# Patient Record
Sex: Female | Born: 1965
Health system: Southern US, Community
[De-identification: ages and names within clinical notes are randomized; demographics above are authoritative.]

## PROBLEM LIST (undated history)

## (undated) ENCOUNTER — Emergency Department (HOSPITAL_COMMUNITY): Admission: EM | Payer: No Typology Code available for payment source | Source: Home / Self Care

## (undated) DIAGNOSIS — T7840XA Allergy, unspecified, initial encounter: Secondary | ICD-10-CM

## (undated) DIAGNOSIS — I10 Essential (primary) hypertension: Secondary | ICD-10-CM

## (undated) DIAGNOSIS — R011 Cardiac murmur, unspecified: Secondary | ICD-10-CM

## (undated) DIAGNOSIS — E669 Obesity, unspecified: Secondary | ICD-10-CM

## (undated) DIAGNOSIS — M928 Other specified juvenile osteochondrosis: Secondary | ICD-10-CM

## (undated) DIAGNOSIS — D649 Anemia, unspecified: Secondary | ICD-10-CM

## (undated) DIAGNOSIS — E785 Hyperlipidemia, unspecified: Secondary | ICD-10-CM

## (undated) HISTORY — DX: Hyperlipidemia, unspecified: E78.5

## (undated) HISTORY — DX: Essential (primary) hypertension: I10

## (undated) HISTORY — DX: Allergy, unspecified, initial encounter: T78.40XA

## (undated) HISTORY — DX: Other specified juvenile osteochondrosis: M92.8

## (undated) HISTORY — DX: Obesity, unspecified: E66.9

## (undated) HISTORY — PX: TUBAL LIGATION: SHX77

---

## 1997-10-04 ENCOUNTER — Encounter: Admission: RE | Admit: 1997-10-04 | Discharge: 1997-10-04 | Payer: Self-pay | Admitting: Family Medicine

## 1997-12-01 ENCOUNTER — Encounter: Admission: RE | Admit: 1997-12-01 | Discharge: 1997-12-01 | Payer: Self-pay | Admitting: Family Medicine

## 1997-12-06 ENCOUNTER — Encounter: Admission: RE | Admit: 1997-12-06 | Discharge: 1997-12-06 | Payer: Self-pay | Admitting: Family Medicine

## 1998-05-03 ENCOUNTER — Encounter: Admission: RE | Admit: 1998-05-03 | Discharge: 1998-05-03 | Payer: Self-pay | Admitting: Family Medicine

## 1998-05-10 ENCOUNTER — Encounter: Admission: RE | Admit: 1998-05-10 | Discharge: 1998-05-10 | Payer: Self-pay | Admitting: Sports Medicine

## 1998-05-19 ENCOUNTER — Other Ambulatory Visit: Admission: RE | Admit: 1998-05-19 | Discharge: 1998-05-19 | Payer: Self-pay | Admitting: *Deleted

## 1998-06-22 ENCOUNTER — Emergency Department (HOSPITAL_COMMUNITY): Admission: EM | Admit: 1998-06-22 | Discharge: 1998-06-22 | Payer: Self-pay | Admitting: Emergency Medicine

## 1998-06-28 ENCOUNTER — Encounter: Admission: RE | Admit: 1998-06-28 | Discharge: 1998-06-28 | Payer: Self-pay | Admitting: Family Medicine

## 1998-10-03 ENCOUNTER — Emergency Department (HOSPITAL_COMMUNITY): Admission: EM | Admit: 1998-10-03 | Discharge: 1998-10-03 | Payer: Self-pay | Admitting: Emergency Medicine

## 1998-11-11 ENCOUNTER — Encounter: Admission: RE | Admit: 1998-11-11 | Discharge: 1998-11-11 | Payer: Self-pay | Admitting: Family Medicine

## 1999-05-17 ENCOUNTER — Encounter: Admission: RE | Admit: 1999-05-17 | Discharge: 1999-05-17 | Payer: Self-pay | Admitting: Family Medicine

## 1999-05-17 ENCOUNTER — Other Ambulatory Visit: Admission: RE | Admit: 1999-05-17 | Discharge: 1999-05-17 | Payer: Self-pay | Admitting: Family Medicine

## 2000-02-07 ENCOUNTER — Encounter: Admission: RE | Admit: 2000-02-07 | Discharge: 2000-02-07 | Payer: Self-pay | Admitting: Family Medicine

## 2000-06-24 ENCOUNTER — Encounter: Admission: RE | Admit: 2000-06-24 | Discharge: 2000-06-24 | Payer: Self-pay | Admitting: Family Medicine

## 2000-06-24 ENCOUNTER — Other Ambulatory Visit: Admission: RE | Admit: 2000-06-24 | Discharge: 2000-06-24 | Payer: Self-pay | Admitting: Family Medicine

## 2000-06-25 ENCOUNTER — Encounter: Admission: RE | Admit: 2000-06-25 | Discharge: 2000-06-25 | Payer: Self-pay | Admitting: Sports Medicine

## 2000-07-16 ENCOUNTER — Encounter: Admission: RE | Admit: 2000-07-16 | Discharge: 2000-07-16 | Payer: Self-pay | Admitting: Family Medicine

## 2000-09-10 ENCOUNTER — Encounter: Admission: RE | Admit: 2000-09-10 | Discharge: 2000-09-10 | Payer: Self-pay | Admitting: Family Medicine

## 2001-03-25 ENCOUNTER — Encounter: Admission: RE | Admit: 2001-03-25 | Discharge: 2001-03-25 | Payer: Self-pay | Admitting: Family Medicine

## 2001-04-03 ENCOUNTER — Encounter: Admission: RE | Admit: 2001-04-03 | Discharge: 2001-04-03 | Payer: Self-pay | Admitting: Family Medicine

## 2001-08-13 ENCOUNTER — Encounter: Admission: RE | Admit: 2001-08-13 | Discharge: 2001-08-13 | Payer: Self-pay | Admitting: Family Medicine

## 2001-11-07 ENCOUNTER — Encounter: Admission: RE | Admit: 2001-11-07 | Discharge: 2001-11-07 | Payer: Self-pay | Admitting: Family Medicine

## 2001-11-07 ENCOUNTER — Ambulatory Visit (HOSPITAL_COMMUNITY): Admission: RE | Admit: 2001-11-07 | Discharge: 2001-11-07 | Payer: Self-pay | Admitting: Family Medicine

## 2001-11-10 ENCOUNTER — Encounter: Admission: RE | Admit: 2001-11-10 | Discharge: 2001-11-10 | Payer: Self-pay | Admitting: Sports Medicine

## 2001-12-25 ENCOUNTER — Encounter: Admission: RE | Admit: 2001-12-25 | Discharge: 2001-12-25 | Payer: Self-pay | Admitting: Family Medicine

## 2001-12-25 ENCOUNTER — Other Ambulatory Visit: Admission: RE | Admit: 2001-12-25 | Discharge: 2001-12-25 | Payer: Self-pay | Admitting: Family Medicine

## 2001-12-25 ENCOUNTER — Encounter (INDEPENDENT_AMBULATORY_CARE_PROVIDER_SITE_OTHER): Payer: Self-pay

## 2002-01-20 ENCOUNTER — Encounter: Admission: RE | Admit: 2002-01-20 | Discharge: 2002-01-20 | Payer: Self-pay | Admitting: Family Medicine

## 2002-07-01 ENCOUNTER — Ambulatory Visit (HOSPITAL_COMMUNITY): Admission: RE | Admit: 2002-07-01 | Discharge: 2002-07-01 | Payer: Self-pay | Admitting: Family Medicine

## 2002-07-01 ENCOUNTER — Other Ambulatory Visit: Admission: RE | Admit: 2002-07-01 | Discharge: 2002-07-01 | Payer: Self-pay | Admitting: Family Medicine

## 2002-07-01 ENCOUNTER — Encounter: Admission: RE | Admit: 2002-07-01 | Discharge: 2002-07-01 | Payer: Self-pay | Admitting: Family Medicine

## 2002-07-01 ENCOUNTER — Encounter: Payer: Self-pay | Admitting: Family Medicine

## 2002-08-19 ENCOUNTER — Inpatient Hospital Stay (HOSPITAL_COMMUNITY): Admission: AD | Admit: 2002-08-19 | Discharge: 2002-08-19 | Payer: Self-pay | Admitting: Obstetrics and Gynecology

## 2003-02-12 ENCOUNTER — Encounter: Admission: RE | Admit: 2003-02-12 | Discharge: 2003-02-12 | Payer: Self-pay | Admitting: Family Medicine

## 2003-02-15 ENCOUNTER — Encounter: Admission: RE | Admit: 2003-02-15 | Discharge: 2003-02-15 | Payer: Self-pay | Admitting: Family Medicine

## 2003-08-02 ENCOUNTER — Encounter: Admission: RE | Admit: 2003-08-02 | Discharge: 2003-08-02 | Payer: Self-pay | Admitting: Family Medicine

## 2003-10-13 ENCOUNTER — Ambulatory Visit: Payer: Self-pay | Admitting: Family Medicine

## 2003-11-10 ENCOUNTER — Ambulatory Visit: Payer: Self-pay | Admitting: Family Medicine

## 2003-12-06 ENCOUNTER — Emergency Department (HOSPITAL_COMMUNITY): Admission: EM | Admit: 2003-12-06 | Discharge: 2003-12-06 | Payer: Self-pay | Admitting: Family Medicine

## 2003-12-22 ENCOUNTER — Emergency Department (HOSPITAL_COMMUNITY): Admission: EM | Admit: 2003-12-22 | Discharge: 2003-12-22 | Payer: Self-pay | Admitting: Family Medicine

## 2004-01-09 ENCOUNTER — Encounter (INDEPENDENT_AMBULATORY_CARE_PROVIDER_SITE_OTHER): Payer: Self-pay | Admitting: *Deleted

## 2004-01-09 LAB — CONVERTED CEMR LAB

## 2004-01-12 ENCOUNTER — Other Ambulatory Visit: Admission: RE | Admit: 2004-01-12 | Discharge: 2004-01-12 | Payer: Self-pay | Admitting: Family Medicine

## 2004-01-12 ENCOUNTER — Ambulatory Visit: Payer: Self-pay | Admitting: Family Medicine

## 2004-01-28 ENCOUNTER — Ambulatory Visit: Payer: Self-pay | Admitting: Sports Medicine

## 2004-04-12 ENCOUNTER — Ambulatory Visit: Payer: Self-pay | Admitting: Family Medicine

## 2004-06-12 ENCOUNTER — Ambulatory Visit: Payer: Self-pay | Admitting: Family Medicine

## 2004-07-14 ENCOUNTER — Ambulatory Visit: Payer: Self-pay | Admitting: Family Medicine

## 2004-07-18 ENCOUNTER — Ambulatory Visit: Payer: Self-pay | Admitting: Family Medicine

## 2004-07-28 ENCOUNTER — Ambulatory Visit: Payer: Self-pay | Admitting: Family Medicine

## 2004-08-07 ENCOUNTER — Emergency Department (HOSPITAL_COMMUNITY): Admission: EM | Admit: 2004-08-07 | Discharge: 2004-08-07 | Payer: Self-pay | Admitting: Family Medicine

## 2004-09-01 ENCOUNTER — Ambulatory Visit: Payer: Self-pay | Admitting: Family Medicine

## 2004-09-04 ENCOUNTER — Ambulatory Visit: Payer: Self-pay | Admitting: Family Medicine

## 2004-09-12 ENCOUNTER — Ambulatory Visit: Payer: Self-pay | Admitting: Sports Medicine

## 2004-09-19 ENCOUNTER — Ambulatory Visit: Payer: Self-pay | Admitting: Family Medicine

## 2004-09-21 ENCOUNTER — Encounter: Admission: RE | Admit: 2004-09-21 | Discharge: 2004-09-21 | Payer: Self-pay | Admitting: Sports Medicine

## 2004-10-27 ENCOUNTER — Ambulatory Visit: Payer: Self-pay | Admitting: Sports Medicine

## 2005-01-14 ENCOUNTER — Emergency Department (HOSPITAL_COMMUNITY): Admission: AD | Admit: 2005-01-14 | Discharge: 2005-01-14 | Payer: Self-pay | Admitting: Emergency Medicine

## 2005-03-26 ENCOUNTER — Emergency Department (HOSPITAL_COMMUNITY): Admission: EM | Admit: 2005-03-26 | Discharge: 2005-03-26 | Payer: Self-pay | Admitting: Family Medicine

## 2005-03-26 ENCOUNTER — Ambulatory Visit (HOSPITAL_COMMUNITY): Admission: RE | Admit: 2005-03-26 | Discharge: 2005-03-26 | Payer: Self-pay | Admitting: Family Medicine

## 2005-03-27 ENCOUNTER — Ambulatory Visit: Payer: Self-pay | Admitting: Family Medicine

## 2005-04-19 ENCOUNTER — Emergency Department (HOSPITAL_COMMUNITY): Admission: EM | Admit: 2005-04-19 | Discharge: 2005-04-19 | Payer: Self-pay | Admitting: Family Medicine

## 2005-05-09 ENCOUNTER — Encounter: Payer: Self-pay | Admitting: Family Medicine

## 2005-09-26 ENCOUNTER — Ambulatory Visit: Payer: Self-pay | Admitting: Family Medicine

## 2005-09-28 ENCOUNTER — Ambulatory Visit: Payer: Self-pay | Admitting: Family Medicine

## 2005-10-12 ENCOUNTER — Ambulatory Visit: Payer: Self-pay | Admitting: Family Medicine

## 2005-10-12 ENCOUNTER — Other Ambulatory Visit: Admission: RE | Admit: 2005-10-12 | Discharge: 2005-10-12 | Payer: Self-pay | Admitting: Family Medicine

## 2005-12-19 ENCOUNTER — Ambulatory Visit: Payer: Self-pay | Admitting: Sports Medicine

## 2006-01-25 ENCOUNTER — Encounter: Payer: Self-pay | Admitting: Family Medicine

## 2006-01-25 ENCOUNTER — Ambulatory Visit: Payer: Self-pay | Admitting: Family Medicine

## 2006-01-31 ENCOUNTER — Emergency Department (HOSPITAL_COMMUNITY): Admission: EM | Admit: 2006-01-31 | Discharge: 2006-01-31 | Payer: Self-pay | Admitting: Family Medicine

## 2006-03-07 DIAGNOSIS — E7800 Pure hypercholesterolemia, unspecified: Secondary | ICD-10-CM | POA: Insufficient documentation

## 2006-03-07 DIAGNOSIS — E669 Obesity, unspecified: Secondary | ICD-10-CM | POA: Insufficient documentation

## 2006-03-07 DIAGNOSIS — M25569 Pain in unspecified knee: Secondary | ICD-10-CM | POA: Insufficient documentation

## 2006-03-07 DIAGNOSIS — G43909 Migraine, unspecified, not intractable, without status migrainosus: Secondary | ICD-10-CM | POA: Insufficient documentation

## 2006-03-08 ENCOUNTER — Encounter (INDEPENDENT_AMBULATORY_CARE_PROVIDER_SITE_OTHER): Payer: Self-pay | Admitting: *Deleted

## 2006-03-21 ENCOUNTER — Telehealth (INDEPENDENT_AMBULATORY_CARE_PROVIDER_SITE_OTHER): Payer: Self-pay | Admitting: *Deleted

## 2006-03-25 ENCOUNTER — Encounter: Admission: RE | Admit: 2006-03-25 | Discharge: 2006-03-25 | Payer: Self-pay | Admitting: Sports Medicine

## 2006-03-25 ENCOUNTER — Encounter: Payer: Self-pay | Admitting: Family Medicine

## 2006-04-09 ENCOUNTER — Telehealth: Payer: Self-pay | Admitting: *Deleted

## 2006-04-10 ENCOUNTER — Ambulatory Visit: Payer: Self-pay | Admitting: Family Medicine

## 2006-05-15 ENCOUNTER — Telehealth: Payer: Self-pay | Admitting: *Deleted

## 2006-05-20 ENCOUNTER — Emergency Department (HOSPITAL_COMMUNITY): Admission: EM | Admit: 2006-05-20 | Discharge: 2006-05-20 | Payer: Self-pay | Admitting: Family Medicine

## 2006-05-21 ENCOUNTER — Telehealth: Payer: Self-pay | Admitting: *Deleted

## 2006-05-22 ENCOUNTER — Telehealth: Payer: Self-pay | Admitting: *Deleted

## 2006-06-10 ENCOUNTER — Encounter: Payer: Self-pay | Admitting: Family Medicine

## 2006-06-10 ENCOUNTER — Ambulatory Visit: Payer: Self-pay | Admitting: Family Medicine

## 2006-06-10 LAB — CONVERTED CEMR LAB

## 2006-06-11 ENCOUNTER — Telehealth: Payer: Self-pay | Admitting: Family Medicine

## 2006-09-13 ENCOUNTER — Ambulatory Visit: Payer: Self-pay | Admitting: Family Medicine

## 2006-09-13 ENCOUNTER — Telehealth (INDEPENDENT_AMBULATORY_CARE_PROVIDER_SITE_OTHER): Payer: Self-pay | Admitting: *Deleted

## 2006-10-06 ENCOUNTER — Telehealth (INDEPENDENT_AMBULATORY_CARE_PROVIDER_SITE_OTHER): Payer: Self-pay | Admitting: *Deleted

## 2006-10-08 ENCOUNTER — Telehealth: Payer: Self-pay | Admitting: *Deleted

## 2006-12-16 ENCOUNTER — Telehealth: Payer: Self-pay | Admitting: *Deleted

## 2007-01-03 ENCOUNTER — Ambulatory Visit: Payer: Self-pay | Admitting: Family Medicine

## 2007-01-06 ENCOUNTER — Encounter: Payer: Self-pay | Admitting: Family Medicine

## 2007-01-06 ENCOUNTER — Ambulatory Visit: Payer: Self-pay | Admitting: Family Medicine

## 2007-01-06 LAB — CONVERTED CEMR LAB
Cholesterol: 210 mg/dL — ABNORMAL HIGH (ref 0–200)
Total CHOL/HDL Ratio: 4.9

## 2007-01-07 ENCOUNTER — Encounter: Payer: Self-pay | Admitting: Family Medicine

## 2007-01-15 ENCOUNTER — Telehealth: Payer: Self-pay | Admitting: *Deleted

## 2007-02-18 ENCOUNTER — Telehealth (INDEPENDENT_AMBULATORY_CARE_PROVIDER_SITE_OTHER): Payer: Self-pay | Admitting: *Deleted

## 2007-03-03 ENCOUNTER — Telehealth: Payer: Self-pay | Admitting: *Deleted

## 2007-03-04 ENCOUNTER — Encounter (INDEPENDENT_AMBULATORY_CARE_PROVIDER_SITE_OTHER): Payer: Self-pay | Admitting: Family Medicine

## 2007-03-04 ENCOUNTER — Other Ambulatory Visit: Admission: RE | Admit: 2007-03-04 | Discharge: 2007-03-04 | Payer: Self-pay | Admitting: Family Medicine

## 2007-03-04 ENCOUNTER — Ambulatory Visit: Payer: Self-pay | Admitting: Family Medicine

## 2007-03-04 LAB — CONVERTED CEMR LAB
FSH: 4.9 milliintl units/mL
HCT: 37.3 % (ref 36.0–46.0)
Hemoglobin: 11.2 g/dL — ABNORMAL LOW (ref 12.0–15.0)
MCHC: 30 g/dL (ref 30.0–36.0)
MCV: 80.2 fL (ref 78.0–100.0)
RBC: 4.65 M/uL (ref 3.87–5.11)
RDW: 14.4 % (ref 11.5–15.5)
TSH: 0.891 microintl units/mL (ref 0.350–5.50)
WBC: 7.9 10*3/uL (ref 4.0–10.5)

## 2007-03-05 ENCOUNTER — Ambulatory Visit (HOSPITAL_COMMUNITY): Admission: RE | Admit: 2007-03-05 | Discharge: 2007-03-05 | Payer: Self-pay | Admitting: Family Medicine

## 2007-03-07 ENCOUNTER — Telehealth: Payer: Self-pay | Admitting: *Deleted

## 2007-03-09 ENCOUNTER — Encounter (INDEPENDENT_AMBULATORY_CARE_PROVIDER_SITE_OTHER): Payer: Self-pay | Admitting: Family Medicine

## 2007-03-12 ENCOUNTER — Encounter (INDEPENDENT_AMBULATORY_CARE_PROVIDER_SITE_OTHER): Payer: Self-pay | Admitting: Family Medicine

## 2007-03-26 ENCOUNTER — Encounter: Admission: RE | Admit: 2007-03-26 | Discharge: 2007-03-26 | Payer: Self-pay | Admitting: Family Medicine

## 2007-05-14 ENCOUNTER — Ambulatory Visit: Payer: Self-pay | Admitting: Family Medicine

## 2007-05-15 ENCOUNTER — Telehealth: Payer: Self-pay | Admitting: Family Medicine

## 2007-06-10 ENCOUNTER — Encounter: Payer: Self-pay | Admitting: Family Medicine

## 2007-08-04 ENCOUNTER — Telehealth: Payer: Self-pay | Admitting: *Deleted

## 2007-08-06 ENCOUNTER — Emergency Department (HOSPITAL_COMMUNITY): Admission: EM | Admit: 2007-08-06 | Discharge: 2007-08-06 | Payer: Self-pay | Admitting: Emergency Medicine

## 2007-08-26 ENCOUNTER — Ambulatory Visit: Payer: Self-pay | Admitting: Family Medicine

## 2007-09-09 ENCOUNTER — Other Ambulatory Visit: Admission: RE | Admit: 2007-09-09 | Discharge: 2007-09-09 | Payer: Self-pay | Admitting: Family Medicine

## 2007-09-09 ENCOUNTER — Encounter: Payer: Self-pay | Admitting: Family Medicine

## 2007-09-09 ENCOUNTER — Telehealth: Payer: Self-pay | Admitting: *Deleted

## 2007-09-09 ENCOUNTER — Ambulatory Visit: Payer: Self-pay | Admitting: Family Medicine

## 2007-09-12 ENCOUNTER — Encounter: Payer: Self-pay | Admitting: Family Medicine

## 2007-09-12 ENCOUNTER — Telehealth (INDEPENDENT_AMBULATORY_CARE_PROVIDER_SITE_OTHER): Payer: Self-pay | Admitting: *Deleted

## 2007-12-10 ENCOUNTER — Telehealth (INDEPENDENT_AMBULATORY_CARE_PROVIDER_SITE_OTHER): Payer: Self-pay | Admitting: Family Medicine

## 2007-12-15 ENCOUNTER — Ambulatory Visit: Payer: Self-pay | Admitting: Family Medicine

## 2007-12-15 ENCOUNTER — Encounter (INDEPENDENT_AMBULATORY_CARE_PROVIDER_SITE_OTHER): Payer: Self-pay | Admitting: Family Medicine

## 2008-01-06 ENCOUNTER — Ambulatory Visit: Payer: Self-pay | Admitting: Family Medicine

## 2008-01-08 ENCOUNTER — Ambulatory Visit: Payer: Self-pay | Admitting: Family Medicine

## 2008-08-10 ENCOUNTER — Ambulatory Visit: Payer: Self-pay | Admitting: Family Medicine

## 2008-08-10 ENCOUNTER — Encounter: Payer: Self-pay | Admitting: Family Medicine

## 2008-08-16 ENCOUNTER — Emergency Department (HOSPITAL_COMMUNITY): Admission: EM | Admit: 2008-08-16 | Discharge: 2008-08-16 | Payer: Self-pay | Admitting: Family Medicine

## 2008-08-17 ENCOUNTER — Telehealth: Payer: Self-pay | Admitting: *Deleted

## 2008-08-18 ENCOUNTER — Encounter: Payer: Self-pay | Admitting: Family Medicine

## 2008-08-19 ENCOUNTER — Encounter: Payer: Self-pay | Admitting: Family Medicine

## 2009-02-17 ENCOUNTER — Emergency Department (HOSPITAL_COMMUNITY): Admission: EM | Admit: 2009-02-17 | Discharge: 2009-02-17 | Payer: Self-pay | Admitting: Emergency Medicine

## 2009-03-09 ENCOUNTER — Telehealth: Payer: Self-pay | Admitting: Family Medicine

## 2009-03-21 ENCOUNTER — Ambulatory Visit: Payer: Self-pay | Admitting: Family Medicine

## 2009-03-21 ENCOUNTER — Encounter: Payer: Self-pay | Admitting: Family Medicine

## 2009-03-22 ENCOUNTER — Encounter: Payer: Self-pay | Admitting: Family Medicine

## 2009-03-22 ENCOUNTER — Encounter: Admission: RE | Admit: 2009-03-22 | Discharge: 2009-03-22 | Payer: Self-pay | Admitting: Family Medicine

## 2009-03-22 LAB — CONVERTED CEMR LAB
ALT: 19 units/L (ref 0–35)
Alkaline Phosphatase: 68 units/L (ref 39–117)
CO2: 21 meq/L (ref 19–32)
Calcium: 9 mg/dL (ref 8.4–10.5)
Creatinine, Ser: 0.85 mg/dL (ref 0.40–1.20)
HDL: 50 mg/dL (ref >39)
MCHC: 29.9 g/dL — ABNORMAL LOW (ref 30.0–36.0)
MCV: 79.4 fL (ref 78.0–100.0)
RBC: 4.8 M/uL (ref 3.87–5.11)
RDW: 14.5 % (ref 11.5–15.5)
Sodium: 137 meq/L (ref 135–145)
Total Bilirubin: 0.4 mg/dL (ref 0.3–1.2)
VLDL: 22 mg/dL (ref 0–40)
WBC: 8.7 10*3/uL (ref 4.0–10.5)

## 2009-03-23 ENCOUNTER — Ambulatory Visit: Payer: Self-pay | Admitting: Family Medicine

## 2009-04-13 ENCOUNTER — Ambulatory Visit: Payer: Self-pay | Admitting: Family Medicine

## 2009-04-13 DIAGNOSIS — H902 Conductive hearing loss, unspecified: Secondary | ICD-10-CM | POA: Insufficient documentation

## 2009-05-04 ENCOUNTER — Encounter: Payer: Self-pay | Admitting: Family Medicine

## 2009-05-04 ENCOUNTER — Ambulatory Visit: Payer: Self-pay | Admitting: Family Medicine

## 2009-05-04 LAB — CONVERTED CEMR LAB: Whiff Test: NEGATIVE

## 2009-05-05 ENCOUNTER — Encounter: Payer: Self-pay | Admitting: Family Medicine

## 2009-05-17 ENCOUNTER — Ambulatory Visit: Payer: Self-pay | Admitting: Family Medicine

## 2009-05-17 DIAGNOSIS — L680 Hirsutism: Secondary | ICD-10-CM | POA: Insufficient documentation

## 2009-05-17 LAB — CONVERTED CEMR LAB
Basophils Absolute: 0 10*3/uL (ref 0.0–0.1)
Basophils Relative: 0 % (ref 0–1)
Beta hcg, urine, semiquantitative: NEGATIVE
FSH: 2.5 milliintl units/mL
MCHC: 29.6 g/dL — ABNORMAL LOW (ref 30.0–36.0)
Monocytes Absolute: 0.5 10*3/uL (ref 0.1–1.0)
Monocytes Relative: 6 % (ref 3–12)
Neutro Abs: 5.8 10*3/uL (ref 1.7–7.7)
TSH: 0.838 microintl units/mL (ref 0.350–4.500)
Testosterone: 44.95 ng/dL (ref 10–70)

## 2009-06-12 ENCOUNTER — Emergency Department (HOSPITAL_COMMUNITY): Admission: EM | Admit: 2009-06-12 | Discharge: 2009-06-12 | Payer: Self-pay | Admitting: Emergency Medicine

## 2009-06-16 ENCOUNTER — Telehealth: Payer: Self-pay | Admitting: Family Medicine

## 2009-06-17 ENCOUNTER — Ambulatory Visit: Payer: Self-pay | Admitting: Family Medicine

## 2009-06-17 DIAGNOSIS — L509 Urticaria, unspecified: Secondary | ICD-10-CM | POA: Insufficient documentation

## 2009-08-16 ENCOUNTER — Ambulatory Visit: Payer: Self-pay | Admitting: Family Medicine

## 2009-08-16 ENCOUNTER — Encounter: Payer: Self-pay | Admitting: Family Medicine

## 2009-08-22 ENCOUNTER — Encounter: Payer: Self-pay | Admitting: Family Medicine

## 2009-09-07 ENCOUNTER — Ambulatory Visit: Payer: Self-pay | Admitting: Family Medicine

## 2009-09-07 ENCOUNTER — Telehealth: Payer: Self-pay | Admitting: Family Medicine

## 2009-09-07 LAB — CONVERTED CEMR LAB: Whiff Test: NEGATIVE

## 2009-09-26 ENCOUNTER — Telehealth: Payer: Self-pay | Admitting: Family Medicine

## 2009-09-26 ENCOUNTER — Ambulatory Visit: Payer: Self-pay | Admitting: Family Medicine

## 2009-09-26 DIAGNOSIS — M928 Other specified juvenile osteochondrosis: Secondary | ICD-10-CM | POA: Insufficient documentation

## 2010-01-04 ENCOUNTER — Encounter: Payer: Self-pay | Admitting: Family Medicine

## 2010-01-30 ENCOUNTER — Emergency Department (HOSPITAL_COMMUNITY)
Admission: EM | Admit: 2010-01-30 | Discharge: 2010-01-30 | Payer: Self-pay | Source: Home / Self Care | Admitting: Emergency Medicine

## 2010-02-07 NOTE — Assessment & Plan Note (Signed)
Summary: READ PPD/B MC  Nurse Visit   Allergies: No Known Drug Allergies  PPD Results    Date of reading: 03/23/2009    Results: < 5mm    Interpretation: negative  Orders Added: 1)  No Charge Patient Arrived (NCPA0) [NCPA0]

## 2010-02-07 NOTE — Progress Notes (Signed)
Summary: triage  Phone Note Call from Patient Call back at Home Phone [phone]   Caller: Patient Summary of Call: Pt was seen at urgent care for hives, but now eye and face is swollen. Initial call taken by: Clydell Hakim,  June 16, 2009 11:20 AM  Follow-up for Phone Call        went to [facility] sunday. using hydrocortisone creme with prednisone which is not working. thinks being in the sun makes it worse. no appt left. sent to UC. she agreed with plan Follow-up by: Golden Circle RN,  June 16, 2009 11:27 AM  Additional Follow-up for Phone Call Additional follow up Details #1::        states the parking lot at [facility] was full. she did not even go in. she has been taking benadryl 50 mg. states the creme they gave her for her face is making it worse or reacting with the sun. she is not going to put any more on appt tomorrow with Dr. Burnadette Pop at 1:30 Additional Follow-up by: Golden Circle RN,  June 16, 2009 3:27 PM

## 2010-02-07 NOTE — Assessment & Plan Note (Signed)
Summary: ? bacterial infection,tcb   Vital Signs:  Patient profile:   45 year old female Height:      68.5 inches Weight:      227 pounds BMI:     34.14 BSA:     2.17 Temp:     98.6 degrees F Pulse rate:   80 / minute BP sitting:   130 / 88  Vitals Entered By: Jone Baseman CMA (May 04, 2009 11:21 AM) CC: ? vaginal irritation Is Patient Diabetic? No Pain Assessment Patient in pain? yes     Location: vaginal area Intensity: 5   Primary Care Provider:  Milinda Antis MD  CC:  ? vaginal irritation.  History of Present Illness: [name] comes in today for vaginal irritation.  On Sunday she had intercourse with a new partner.  She had not had intercourse for a little while and was very dry.  She didn't have any lubrication and still proceeded with intercourse, eventhough it was somewhat painful.  Monday she noticed burning and irritation so she put vaseline inher vagina nad now she is having itching.  Denies discharge.  Did not use protection. NO abdominal pain or fevers.  Habits & Providers  Alcohol-Tobacco-Diet     Tobacco Status: never  Allergies: No Known Drug Allergies  Physical Exam  General:  overweight, alert, NAD, vitals reviewed Abdomen:  soft, nt, nd Genitalia:  normal introitus.  vaginal walls will scattered areas of erythema/irritation.  [place] amount of white discharge.  No cervical lesions.    Impression & Recommendations:  Problem # 1:  VAGINITIS (ICD-616.10) Assessment New  Wet prep negative for BV, yeast, and trich.  Likely irritation from the friction of intercourse.  Advised using KY or similar lubrication in the future.  Also advised condom use everytime.    Orders: [facility]- Est Level  3 ([address])  Complete Medication List: 1)  Claritin 10 Mg Tabs (Loratadine) .... Take 1 tablet by mouth once a day 2)  Maxalt 10 Mg Tabs (Rizatriptan benzoate) .... Take 1 tablet by mouth 3x a week for migraines 3)  Zofran 4 Mg Tabs (Ondansetron hcl) .Marland Kitchen.. 1  by mouth every 6 hours as needed for nausea and vomiting 4)  Ibuprofen 800 Mg Tabs (Ibuprofen) .Marland Kitchen.. 1 by mouth three times as needed pain 5)  Terbinafine Hcl 250 Mg Tabs (Terbinafine hcl) .Marland Kitchen.. 1 by mouth daily x 12 weeks dispense: qs 6)  Fish Oil 1000 Mg Caps (Omega-3 fatty acids) .Marland Kitchen.. 1 cap two times a day 7)  Flagyl 500 Mg Tabs (Metronidazole) .Marland Kitchen.. 1 by mouth two times a day x 7 days do not take with alcohol 8)  Fluticasone Propionate 50 Mcg/act Susp (Fluticasone propionate) .Marland Kitchen.. 1 spray each nostril daily  Other [facility]: [facility] [phone]) GC/Chlamydia-[facility] ([id number])  Laboratory Results  Date/Time Received: May 04, 2009 11:36 AM  Date/Time Reported: May 04, 2009 11:50 AM   [employer] Source: vaginal WBC/hpf: >20 Bacteria/hpf: 3+  Cocci Clue cells/hpf: none  Negative whiff Yeast/hpf: none Trichomonas/hpf: none Comments: ...........test performed by...........Marland KitchenTerese Door, CMA

## 2010-02-07 NOTE — Progress Notes (Signed)
Summary: triage  Phone Note Call from Patient Call back at Home Phone [phone]   Caller: Patient Summary of Call: Pt wondering if she can get rx that was once called in for her for toenail fungus.   Initial call taken by: Clydell Hakim,  March 09, 2009 1:38 PM  Follow-up for Phone Call        called her to offer appt. she has on on the 14th & wants to wait until then Follow-up by: Golden Circle RN,  March 09, 2009 2:21 PM

## 2010-02-07 NOTE — Assessment & Plan Note (Signed)
Summary: swelling above ankle/[place]ott   Vital Signs:  Patient profile:   45 year old female Height:      68.5 inches Weight:      231 pounds BMI:     34.74 Temp:     98.6 degrees F Pulse rate:   69 / minute BP sitting:   109 / 70  (left arm)  Vitals Entered By: Theresia Lo RN (September 26, 2009 10:41 AM) CC:  left leg has area of swelling Is Patient Diabetic? No Pain Assessment Patient in pain? no        Primary Care [place]:  Milinda Antis MD  CC:   left leg has area of swelling.  History of Present Illness: Rachel Cortez has noticed a small bump on her right lateral leg above her ankle for the past week.  She associaties it with recent increase in walking for exercise.  No recent immobilization, surgery, etc.  No pain, itching, redness, swelling.  No trauma to the area.  Also complans of left anterior knee pain in area where she has Osgood-Shlatter.  Pain is only when she kneels on it for long periods of time.  otherwise has been asymptomatic since childhood.  She is concerned mostly of cosmetic appearance.  No significant change.  Habits & Providers  Alcohol-Tobacco-Diet     Tobacco Status: never  Current Medications (verified): 1)  Claritin 10 Mg Tabs (Loratadine) .... Take 1 Tablet By Mouth Once A Day 2)  Maxalt 10 Mg Tabs (Rizatriptan Benzoate) .... Take 1 Tablet By Mouth 3x A Week For Migraines 3)  Fish Oil 1000 Mg Caps (Omega-3 Fatty Acids) .Marland Kitchen.. 1 Cap Two Times A Day 4)  Fluticasone Propionate 50 Mcg/act Susp (Fluticasone Propionate) .Marland Kitchen.. 1 Spray Each Nostril Daily  Allergies: No Known Drug Allergies PMH-FH-SH reviewed for relevance  Review of Systems      See HPI  Physical Exam  General:  VS Reviewed. Well appearing.   Extremities:   bony prominence with overlying soft tissue over the left tibial tubercle.  Varicosities present.  No tenderness, erythema.  0.75 cm soft tissue prominence, poorly circumscribed located over right lateral leg above lateral  malleolus. no erythema, pain.   Impression & Recommendations:  Problem # 1:  OTHER DISORDERS OF SOFT TISSUE (ICD-729.99)  possible early varicose vein, or less likely lipoma.  Not concerning for DVT.  Advised no further treatment.  Will return for evluation if she feels it is chnging.  Orders: [facility]- Est Level  3 ([address])  Problem # 2:  Hx of OSGOOD SCHLATTER'S DISEASE (ICD-732.4)  unchanged since childhood.  Advised protecting bony prominence while kneeling.    Orders: [facility]- Est Level  3 ([address])  Complete Medication List: 1)  Claritin 10 Mg Tabs (Loratadine) .... Take 1 tablet by mouth once a day 2)  Maxalt 10 Mg Tabs (Rizatriptan benzoate) .... Take 1 tablet by mouth 3x a week for migraines 3)  Fish Oil 1000 Mg Caps (Omega-3 fatty acids) .Marland Kitchen.. 1 cap two times a day 4)  Fluticasone Propionate 50 Mcg/act Susp (Fluticasone propionate) .Marland Kitchen.. 1 spray each nostril daily  Patient Instructions: 1)  the bump on your ankle may be due to a lipoma (fatty tissue) or early varicose vein.  They are not dangerous and there is nothing you need to do with it. 2)  The bump on your ankle is leftover from yoru [employer].  Kneel on a padded surface, but otherwise, nothing you need to do. 3)  Follow-up with your  PCP.

## 2010-02-07 NOTE — Progress Notes (Signed)
Summary: triage  Phone Note Call from Patient Call back at Home Phone [phone]   Caller: Patient Summary of Call: Has knot on side of leg.  Has been walking twice a day and wonders if this could cause it. Initial call taken by: Clydell Hakim,  September 26, 2009 9:51 AM  Follow-up for Phone Call        above ankle on outer aspect. started last night. not painful. feels like "fatty tissue" stands a lot on her job & has been walking more lately. wants it checked. 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 aware she will not be seeing her pcp Follow-up by: Golden Circle RN,  September 26, 2009 9:55 AM

## 2010-02-07 NOTE — Assessment & Plan Note (Signed)
Summary: cough x 1 wk/Rachel Cortez/[place]  [facility]  Vital Signs:  Patient profile:   45 year old female Height:      68.5 inches Weight:      227.4 pounds BMI:     34.20 Temp:     98.3 degrees F Pulse rate:   60 / minute BP sitting:   114 / 78  Vitals Entered By: Golden Circle RN (August 16, 2009 11:25 AM) CC: cough x 1 week   Primary Care Provider:  Milinda Antis MD  CC:  cough x 1 week.  History of Present Illness: 44 yo here for work in appt for cough x 1 week  last week had a sore throat, rhinorrhea.  ow resolved but continues to have cough- mostly when she talks or laughs.  Is not usuing any remedies other than prescribed meds.  Denies fever, chills, SOB, sore throat, CP, sputum, myalgias.  Cough not worse with laying down or after meals, or at night.  Current Medications (verified): 1)  Claritin 10 Mg Tabs (Loratadine) .... Take 1 Tablet By Mouth Once A Day 2)  Maxalt 10 Mg Tabs (Rizatriptan Benzoate) .... Take 1 Tablet By Mouth 3x A Week For Migraines 3)  Terbinafine Hcl 250 Mg Tabs (Terbinafine Hcl) .Marland Kitchen.. 1 By Mouth Daily X 12 Weeks Dispense: Qs 4)  Fish Oil 1000 Mg Caps (Omega-3 Fatty Acids) .Marland Kitchen.. 1 Cap Two Times A Day 5)  Fluticasone Propionate 50 Mcg/act Susp (Fluticasone Propionate) .Marland Kitchen.. 1 Spray Each Nostril Daily 6)  Prednisone (Pak) 10 Mg Tabs (Prednisone) .... 6 Tabs Day 1, 5 Day 2, 4 Day 3, 3 Day 4, 2 Day 5, and 1 Day 6 7)  Tessalon Perles 100 Mg Caps (Benzonatate) .... Take One Tablet Three Times A Day As Needed  Cough  Allergies: No Known Drug Allergies PMH-FH-SH reviewed-no changes except otherwise noted  Review of Systems      See HPI  Physical Exam  General:  VS Reviewed. Well appearing.  Hoarse voice.  Eyes:  No corneal or conjunctival inflammation noted.  Ears:  External ear exam shows no significant lesions or deformities.  Otoscopic examination reveals clear canals, tympanic membranes are intact bilaterally without bulging, retraction,  inflammation or discharge. Hearing is grossly normal bilaterally. Mouth:  Oral mucosa and oropharynx without lesions or exudates.  Lungs:  Normal respiratory effort, chest expands symmetrically. Lungs are clear to auscultation, no crackles or wheezes. Heart:  Normal rate and regular rhythm. S1 and S2 normal without gallop, murmur, click, rub or other extra sounds.   Impression & Recommendations:  Problem # 1:  COUGH (ICD-786.2)  seems to be due to post-nasal drainaige in the wake of recent viral illness.  WIll treat symptomatically with continued claritin + decongestant adn tessalon perles.  Discussed usual course of cough after illness may be prolonged even after cold is resolved.  No evidence of other factors such as GERD, asthma, tobacco use.  Orders: [facility]- Est Level  3 ([address])  Complete Medication List: 1)  Claritin 10 Mg Tabs (Loratadine) .... Take 1 tablet by mouth once a day 2)  Maxalt 10 Mg Tabs (Rizatriptan benzoate) .... Take 1 tablet by mouth 3x a week for migraines 3)  Terbinafine Hcl 250 Mg Tabs (Terbinafine hcl) .Marland Kitchen.. 1 by mouth daily x 12 weeks dispense: qs 4)  Fish Oil 1000 Mg Caps (Omega-3 fatty acids) .Marland Kitchen.. 1 cap two times a day 5)  Fluticasone Propionate 50 Mcg/act Susp (Fluticasone propionate) .Marland Kitchen.. 1 spray each  nostril daily 6)  Prednisone (pak) 10 Mg Tabs (Prednisone) .... 6 tabs day 1, 5 day 2, 4 day 3, 3 day 4, 2 day 5, and 1 day 6 7)  Tessalon Perles 100 Mg Caps (Benzonatate) .... Take one tablet three times a day as needed  cough  Patient Instructions: 1)  I think you have a post-viral cough 2)  may add a decongestant such a pseudoephedrine to help dry up post-nasal drip 3)  Will send tessalon perles to your pharmacy to help with cough. Prescriptions: TESSALON PERLES 100 MG CAPS (BENZONATATE) take one tablet three times a day as needed  cough  #30 x 0   Entered and Authorized by:   Delbert Harness MD   Signed by:   Delbert Harness MD on 08/16/2009   Method used:    [facility]        [facility] [address] [facility] [address] [phone]* (retail)       [address]       [place], [place]  [address]       Ph: [phone] or [phone]       Fax: [phone]   RxID:   [id number]

## 2010-02-07 NOTE — Letter (Signed)
Summary: Lab-Female  All     ,     Phone:   Fax:     03/22/2009        Amarya Dimaano [address]  [place], [place]  [address]   Dear Ms. Buttler:  We have carefully reviewed the results of your tests noted below and the results are:  PREVENTIVE CARE TESTING:   Mammogram: normal on 03/26/2007- MAMMOGRAM OVERDUE   PAP smear: NEGATIVE FOR INTRAEPITHELIAL LESIONS OR MALIGNANCY. on 08/10/2008      Cholesterol: 216 on 03/21/2009   Goal < 200   HDL "good" Cholesterol: 50 on 03/21/2009   LDL "bad" Cholesterol: 144  on 03/21/2009  Goal < 120   Triglycerides: 111    Goal < 150      Other Lab work:Your Complete Metabolic Panel was normal which checked your kidney function, liver and electrolytes     Special recommendations:    Increase your exercise 30 minutes 3-4x a week.   Take Fish Oil Caplets 1000mg  twice a day.   You will need repeat blood work in 1 year.    If you have any questions, please call. We appreciate being able to work with you.   Sincerely,   Milinda Antis MD Typed by: Milinda Antis MD  Appended Document: Lab-Female mailed.

## 2010-02-07 NOTE — Miscellaneous (Signed)
Summary: Physical Form  Patient dropped off form to be filled out for employer.  Please call her when completed. Bradly Bienenstock  August 22, 2009 11:30 AM to pcp.Golden Circle RN  August 22, 2009 1:54 PM   Appended Document: Physical Form pt notified that form was at front for her to pick up

## 2010-02-07 NOTE — Assessment & Plan Note (Signed)
Summary: CPE/tb test,df   Vital Signs:  Patient profile:   45 year old female Height:      68.5 inches Weight:      233 pounds BMI:     35.04 Pulse rate:   71 / minute BP sitting:   114 / 77  (right arm)  Vitals Entered By: Arlyss Repress CMA, (March 21, 2009 9:01 AM) CC: Vaingal irritation PPD for possible employment. Reesa Chew fungus Is Patient Diabetic? No Pain Assessment Patient in pain? no        Primary Care Provider:  Levander Campion MD  CC:  Vaingal irritation PPD for possible employment. Reesa Chew fungus.  History of Present Illness:  Cell # [id number] S/P CPE and PAP in Aug 2010 did not follow up to have cholesterol and labs done. Will need to do so today. LMP - 2 weeks ago  PPD needed for employment patient looking for new job  Toenail- yellowing and thickening of great toes bilat. A year ago, was given prescription for nail fungus but unable to afford it. No pain, itching, but does not like comestic appearance.  Vaginal irriation- past few days with irriation to vagina , shaved and changed soaps recently. Does not have a lot of discharge, but has a fishy odor to vagina. Pt noted odor prior to LMP as well. No abdominal pain.     Habits & Providers  Alcohol-Tobacco-Diet     Tobacco Status: never  Current Medications (verified): 1)  Claritin 10 Mg Tabs (Loratadine) .... Take 1 Tablet By Mouth Once A Day 2)  Maxalt 10 Mg Tabs (Rizatriptan Benzoate) .... Take 1 Tablet By Mouth 3x A Week For Migraines 3)  Zofran 4 Mg Tabs (Ondansetron Hcl) .Marland Kitchen.. 1 By Mouth Every 6 Hours As Needed For Nausea and Vomiting 4)  Ibuprofen 800 Mg Tabs (Ibuprofen) .Marland Kitchen.. 1 By Mouth Three Times As Needed Pain 5)  Terbinafine Hcl 250 Mg Tabs (Terbinafine Hcl) .Marland Kitchen.. 1 By Mouth Daily X 12 Weeks Dispense: Qs 6)  Fish Oil 1000 Mg Caps (Omega-3 Fatty Acids) .Marland Kitchen.. 1 Cap Two Times A Day  Allergies (verified): No Known Drug Allergies  Review of Systems       Per HPI  Physical Exam  General:   Well-developed,well-nourished,in no acute distress; alert,appropriate and cooperative throughout examination. obese Vital signs noted  Genitalia:  Normal introitus for age, thick milky white discharge, non odorous, erythema and irriation of labia majora inner portion and outer portion, non tender to palpation, no lesions noted. Shaved pubic region Extremities:  Bilateral Great toes- thickening of nails, yellow discorlation to nail beds, nails not friable, non tender, no erythema or other lesions   Impression & Recommendations:  Problem # 1:  ONYCHOMYCOSIS, TOENAILS (ICD-110.1) Assessment New  Will treat with terbinafne, based on length of infection and comestic issues. check LFT Her updated medication list for this problem includes:    Terbinafine Hcl 250 Mg Tabs (Terbinafine hcl) .Marland Kitchen... 1 by mouth daily x 12 weeks dispense: qs  Orders: [facility]- Est  Level 4 ([address])  Problem # 2:  BACTERIAL VAGINITIS (ICD-616.10) Assessment: New  Cause of irritation, instructed on no bubble baths, or fragrant soaps. Flagyl x 7 days  Her updated medication list for this problem includes:    Flagyl 500 Mg Tabs (Metronidazole) .Marland Kitchen... 1 by mouth two times a day x 7 days do not take with alcohol  Orders: [facility]- Est  Level 4 ([address])  Problem # 3:  HYPERCHOLESTEROLEMIA (ICD-272.0) Assessment: Unchanged  Orders: Lipid-[facility] (  [phone]) Comp Met-[facility] [phone]) CBC-[facility] ([address]) [facility]- Est  Level 4 ([address])  Complete Medication List: 1)  Claritin 10 Mg Tabs (Loratadine) .... Take 1 tablet by mouth once a day 2)  Maxalt 10 Mg Tabs (Rizatriptan benzoate) .... Take 1 tablet by mouth 3x a week for migraines 3)  Zofran 4 Mg Tabs (Ondansetron hcl) .Marland Kitchen.. 1 by mouth every 6 hours as needed for nausea and vomiting 4)  Ibuprofen 800 Mg Tabs (Ibuprofen) .Marland Kitchen.. 1 by mouth three times as needed pain 5)  Terbinafine Hcl 250 Mg Tabs (Terbinafine hcl) .Marland Kitchen.. 1 by mouth daily x 12 weeks dispense: qs 6)  Fish Oil 1000 Mg Caps  (Omega-3 fatty acids) .Marland Kitchen.. 1 cap two times a day 7)  Flagyl 500 Mg Tabs (Metronidazole) .Marland Kitchen.. 1 by mouth two times a day x 7 days do not take with alcohol  [facility]: [facility] TB Skin Test [phone]) Admin 1st Vaccine (71696)  Patient Instructions: 1)  I will send you a letter with your lab results 2)  Use the fungal mediacation daily for the next 12 weeks 3)  Please come back to have your PPD checked by the nurse on Wed 4)  Please schedule your Mammogram 5)  Your next PAP smear is due in Aug 2011 Prescriptions: FLAGYL 500 MG TABS (METRONIDAZOLE) 1 by mouth two times a day x 7 days Do not take with alcohol  #14 x 0   Entered and Authorized by:   Milinda Antis MD   Signed by:   Milinda Antis MD on 03/21/2009   Method used:   [facility] to        [facility] [address] [phone]* (retail)       [address]       [place], [place]  [address]       Ph: [phone] or [phone]       Fax: [phone]   RxID:   [id number] CLARITIN 10 MG TABS (LORATADINE) Take 1 tablet by mouth once a day  #30 Tablet x 5   Entered and Authorized by:   Milinda Antis MD   Signed by:   Milinda Antis MD on 03/21/2009   Method used:   Print then Give to Patient   RxID:   [id number] IBUPROFEN 800 MG TABS (IBUPROFEN) 1 by mouth three times as needed pain  #40 x 3   Entered and Authorized by:   Milinda Antis MD   Signed by:   Milinda Antis MD on 03/21/2009   Method used:   Print then Give to Patient   RxID:   [id number] TERBINAFINE HCL 250 MG TABS (TERBINAFINE HCL) 1 by mouth daily x 12 weeks Dispense: QS  #90 x 0   Entered and Authorized by:   Milinda Antis MD   Signed by:   Milinda Antis MD on 03/21/2009   Method used:   Print then Give to Patient   RxID:   [id number]   Laboratory Results  Date/Time Received: March 21, 2009 9:28 AM  Date/Time Reported: March 21, 2009 9:40 AM   [place]et [place] Source: vag WBC/hpf: >20 Bacteria/hpf:  3+ cocci and rods Clue cells/hpf: moderate Yeast/hpf: none Trichomonas/hpf: none Comments: ...............test performed by......Marland KitchenBonnie A. [place], MLS (ASCP)cm      Immunizations Administered:  PPD Skin Test:    Vaccine Type: PPD    Site: left forearm    Mfr: Sanofi Pasteur    Dose: 0.1 ml  Route: ID    Given by: Tessie Fass CMA    Exp. Date: 06/05/2011    Lot #: [id number]

## 2010-02-07 NOTE — Miscellaneous (Signed)
Summary: walk in  Clinical Lists Changes came in c/o persistant cough & chest pain with coughing. sick x 1 wk. has been exposed to others with this & smokers. she does not smoke. work in now with Dr. Katha Cabal RN  August 16, 2009 11:21 AM

## 2010-02-07 NOTE — Assessment & Plan Note (Signed)
Summary: vag itching/[place]   Vital Signs:  Patient profile:   45 year old female Height:      68.5 inches Weight:      228 pounds BMI:     34.29 Temp:     98.4 degrees F oral Pulse rate:   77 / minute BP sitting:   128 / 69  (left arm) Cuff size:   large  Vitals Entered By: Tessie Fass CMA (September 07, 2009 10:45 AM) CC: vaginal itch Pain Assessment Patient in pain? no        Primary Care Provider:  Milinda Antis MD  CC:  vaginal itch.  History of Present Illness: 45 yo F:  1. Vaginal Itching: x a few days, no discharge, no abdominal pain, no fever/chills, no rash, no concern for STDs. states that she has been walking more recently and used her husband's soap to clean herself the other day.  Current Medications (verified): 1)  Claritin 10 Mg Tabs (Loratadine) .... Take 1 Tablet By Mouth Once A Day 2)  Maxalt 10 Mg Tabs (Rizatriptan Benzoate) .... Take 1 Tablet By Mouth 3x A Week For Migraines 3)  Terbinafine Hcl 250 Mg Tabs (Terbinafine Hcl) .Marland Kitchen.. 1 By Mouth Daily X 12 Weeks Dispense: Qs 4)  Fish Oil 1000 Mg Caps (Omega-3 Fatty Acids) .Marland Kitchen.. 1 Cap Two Times A Day 5)  Fluticasone Propionate 50 Mcg/act Susp (Fluticasone Propionate) .Marland Kitchen.. 1 Spray Each Nostril Daily 6)  Diflucan 100 Mg Tabs (Fluconazole) .... One By Mouth X 1 Day, Then Again 3 Days Later  Allergies (verified): No Known Drug Allergies  Review of Systems      See HPI  Physical Exam  General:  VS Reviewed. Well appearing.   Genitalia:  Normal introitus.  Vaginal walls will scattered areas of erythema/irritation.  Scant amount of white discharge.  No cervical lesions. Labial erythema with few satellite lesions surrounding. Psych:  Oriented X3, memory intact for recent and remote, normally interactive, good eye contact, not anxious appearing, and not depressed appearing.     Impression & Recommendations:  Problem # 1:  VAGINAL PRURITUS (ICD-698.1) Assessment Deteriorated Wet Prep negative. Discussed  proper hygeine. Avoid perfumes and dyes. Use lubrication during sexual intercourse. Wear loose-fitted clothing when walking. Orders: Wet Prep- [facility] (87210) [facility]- Est  Level 4 (99214)  Problem # 2:  CANDIDIASIS OF VULVA AND VAGINA (ICD-112.1) Assessment: Unchanged  Her updated medication list for this problem includes:    Diflucan 100 Mg Tabs (Fluconazole) ..... One by mouth x 1 day, then again 3 days later  Orders: [facility]- Est  Level 4 (09811)  Problem # 3:  OBESITY, NOS (ICD-278.00) Assessment: Unchanged  Encouraged continued exercise for weight loss.  Orders: [facility]- Est  Level 4 (99214)  Complete Medication List: 1)  Claritin 10 Mg Tabs (Loratadine) .... Take 1 tablet by mouth once a day 2)  Maxalt 10 Mg Tabs (Rizatriptan benzoate) .... Take 1 tablet by mouth 3x a week for migraines 3)  Fish Oil 1000 Mg Caps (Omega-3 fatty acids) .Marland Kitchen.. 1 cap two times a day 4)  Fluticasone Propionate 50 Mcg/act Susp (Fluticasone propionate) .Marland Kitchen.. 1 spray each nostril daily 5)  Diflucan 100 Mg Tabs (Fluconazole) .... One by mouth x 1 day, then again 3 days later Prescriptions: DIFLUCAN 100 MG TABS (FLUCONAZOLE) one by mouth x 1 day, then again 3 days later  #2 x 0   Entered and Authorized by:   Helane Rima DO   Signed by:   Alcario Drought  Rhen Kawecki DO on 09/07/2009   Method used:   [facility]        [facility] [address] [phone]* (retail)       [address]       [place], [place]  [address]       Ph: [phone] or [phone]       Fax: [phone]   RxID:   [id number]   Laboratory Results  Date/Time Received: September 07, 2009 11:32 AM  Date/Time Reported: September 07, 2009 11:44 AM   [employer] Source: vaginal WBC/hpf: 1-5 Bacteria/hpf: 1+  Rods Clue cells/hpf: none  Negative whiff Yeast/hpf: none Trichomonas/hpf: none Comments: ...........test performed by...........Marland KitchenTerese Door, CMA

## 2010-02-07 NOTE — Assessment & Plan Note (Signed)
Summary: Hives   Vital Signs:  Patient profile:   45 year old female Height:      68.5 inches Temp:     97.4 degrees F Pulse rate:   53 / minute BP sitting:   137 / 73  Vitals Entered By: Jone Baseman CMA (June 17, 2009 1:42 PM) CC: hives Is Patient Diabetic? No Pain Assessment Patient in pain? no        Primary Care Provider:  Milinda Antis MD  CC:  hives.  History of Present Illness: 45yo F w/ hives  Hives: First noticed erythema and itching on the face Sunday morning after eating crab dip the night prior.  No prior hx of shellfish allergy.  No exposure to pcn or codeine (known allergies).  No new meds or soaps or detergents.  Hives have spread from face to neck to chest to arms.  No difficulty breathing or swallowing.  She was seen at urgent care and provided hydrocortisone topical and advised to take benadryl and claritin.  Habits & Providers  Alcohol-Tobacco-Diet     Tobacco Status: never  Current Medications (verified): 1)  Claritin 10 Mg Tabs (Loratadine) .... Take 1 Tablet By Mouth Once A Day 2)  Maxalt 10 Mg Tabs (Rizatriptan Benzoate) .... Take 1 Tablet By Mouth 3x A Week For Migraines 3)  Terbinafine Hcl 250 Mg Tabs (Terbinafine Hcl) .Marland Kitchen.. 1 By Mouth Daily X 12 Weeks Dispense: Qs 4)  Fish Oil 1000 Mg Caps (Omega-3 Fatty Acids) .Marland Kitchen.. 1 Cap Two Times A Day 5)  Fluticasone Propionate 50 Mcg/act Susp (Fluticasone Propionate) .Marland Kitchen.. 1 Spray Each Nostril Daily 6)  Prednisone (Pak) 10 Mg Tabs (Prednisone) .... 6 Tabs Day 1, 5 Day 2, 4 Day 3, 3 Day 4, 2 Day 5, and 1 Day 6  Allergies (verified): No Known Drug Allergies  Review of Systems      See HPI  Physical Exam  General:  VS Reviewed. Well appearing w/ obvious erythema and hives on the face and neck, NAD.  Lungs:  Normal respiratory effort, chest expands symmetrically. Lungs are clear to auscultation, no crackles or wheezes. Heart:  Normal rate and regular rhythm. S1 and S2 normal without gallop, murmur,  click, rub or other extra sounds. Skin:  erythematous hives on face,neck, chest and left arm   Impression & Recommendations:  Problem # 1:  UNSPECIFIED URTICARIA (ICD-708.9) Assessment New  New hives x 6 days.  Suspect that it may be an acute reaction to the crab she ate.  No respiratory distress. Plan to treat with prednisone dose pack, continue with the claritin, and f/u next week if no improvement.  Orders: [facility]- Est Level  3 ([address])  Complete Medication List: 1)  Claritin 10 Mg Tabs (Loratadine) .... Take 1 tablet by mouth once a day 2)  Maxalt 10 Mg Tabs (Rizatriptan benzoate) .... Take 1 tablet by mouth 3x a week for migraines 3)  Terbinafine Hcl 250 Mg Tabs (Terbinafine hcl) .Marland Kitchen.. 1 by mouth daily x 12 weeks dispense: qs 4)  Fish Oil 1000 Mg Caps (Omega-3 fatty acids) .Marland Kitchen.. 1 cap two times a day 5)  Fluticasone Propionate 50 Mcg/act Susp (Fluticasone propionate) .Marland Kitchen.. 1 spray each nostril daily 6)  Prednisone (pak) 10 Mg Tabs (Prednisone) .... 6 tabs day 1, 5 day 2, 4 day 3, 3 day 4, 2 day 5, and 1 day 6  Patient Instructions: 1)  Follow up next week if no improvement. 2)  Continue on the claritin. 3)  Avoid  shellfish. Prescriptions: PREDNISONE (PAK) 10 MG TABS (PREDNISONE) 6 tabs day 1, 5 day 2, 4 day 3, 3 day 4, 2 day 5, and 1 day 6  #21 x 0   Entered and Authorized by:   Marisue Ivan  MD   Signed by:   Marisue Ivan  MD on 06/17/2009   Method used:   [facility]        [facility] [address] [phone]* (retail)       [address]       [place], [place]  [address]       Ph: [phone] or [phone]       Fax: [phone]   RxID:   [id number]

## 2010-02-07 NOTE — Assessment & Plan Note (Signed)
Summary: problems with ears/[place]/eo   Vital Signs:  Patient profile:   45 year old female Height:      68.5 inches Weight:      232.6 pounds BMI:     34.98 Temp:     98.9 degrees F oral Pulse rate:   80 / minute BP sitting:   132 / 77  (left arm) Cuff size:   regular  Vitals Entered By: Garen Grams LPN (April 13, 1608 9:30 AM) CC: problem hearing out of left ear Is Patient Diabetic? No Pain Assessment Patient in pain? no       Hearing Screen  20db HL: Left  500 hz: 20db 1000 hz: 20db 2000 hz: 20db 4000 hz: 20db Right  500 hz: 20db 1000 hz: 20db 2000 hz: 20db 4000 hz: 20db   Hearing Testing Entered By: Garen Grams LPN (April 14, 9602 9:36 AM)   Primary Care Provider:  Milinda Antis MD  CC:  problem hearing out of left ear.  History of Present Illness: 45 yo female presenting with 4 month history of decreased hearing and a sense of fullnes in the LEFT ear.  She mostly notices this when using the earpiece on her phone or MP3 player.  Is not affecting her other function.  She denies otalga, fever, pharyngitis, nasal congestion, maxillary pain, frontal pain, mastoid pain, vertigo, ear discharge.  No prior history of similar problems.  No history of hearing loss.  Habits & Providers  Alcohol-Tobacco-Diet     Tobacco Status: never  Allergies (verified): No Known Drug Allergies  Review of Systems       Per HPI.  Physical Exam  General:  Well-developed,well-nourished,in no acute distress; alert,appropriate and cooperative throughout examination Eyes:  PERRL, EOMI. Ears:  External ear exam shows no significant lesions or deformities.  Otoscopic examination reveals clear canals, tympanic membranes are intact bilaterally without bulging, retraction, inflammation or discharge. Hearing is grossly normal bilaterally.  No mastoid swelling or tenderness. Nose:  External nasal examination shows no deformity or inflammation. Nasal mucosa are pink and moist without  lesions or exudates. Mouth:  Oral mucosa and oropharynx without lesions or exudates.  Teeth in good repair. Neurologic:  Cranial nerves II-XII intact.   Additional Exam:  TUNING FORK TESTS (256 Hz)  - WEBER:  Lateralizes to LEFT ear  - RINNE:  Normal in both ears (air conduction > bone conduction bilateral)    Impression & Recommendations:  Problem # 1:  HEARING LOSS, CONDUCTIVE, LEFT (ICD-389.00) Assessment New Mild.  Normal hearing screen at all frequencies.  Weber lateralizes to LEFT ear.  Rinne normal bilateral (air conduction > bone conduction bilateral).  Likely Eustachian tube dysfunction, though no objective signs on exam.  Is already taking Loratadine.  Will do trial of nasal Fluticasone.  f/u 1 month if not better. Orders: [facility]- Est Level  3 (54098)  Complete Medication List: 1)  Claritin 10 Mg Tabs (Loratadine) .... Take 1 tablet by mouth once a day 2)  Maxalt 10 Mg Tabs (Rizatriptan benzoate) .... Take 1 tablet by mouth 3x a week for migraines 3)  Zofran 4 Mg Tabs (Ondansetron hcl) .Marland Kitchen.. 1 by mouth every 6 hours as needed for nausea and vomiting 4)  Ibuprofen 800 Mg Tabs (Ibuprofen) .Marland Kitchen.. 1 by mouth three times as needed pain 5)  Terbinafine Hcl 250 Mg Tabs (Terbinafine hcl) .Marland Kitchen.. 1 by mouth daily x 12 weeks dispense: qs 6)  Fish Oil 1000 Mg Caps (Omega-3 fatty acids) .Marland Kitchen.. 1 cap  two times a day 7)  Flagyl 500 Mg Tabs (Metronidazole) .Marland Kitchen.. 1 by mouth two times a day x 7 days do not take with alcohol 8)  Fluticasone Propionate 50 Mcg/act Susp (Fluticasone propionate) .Marland Kitchen.. 1 spray each nostril daily  Patient Instructions: 1)  Pleasure to meet you today. 2)  Your hearing is normal by most of our tests today, but one test did show that you might have some slight loss of hearing in the left ear due to conduction.  This can often be temporary and due to allergies. 3)  Continue to take your Claritin daily. 4)  I have also prescribed Fluticasone nasal spray.  Start by using 2 sprays in  each nostril daily for 3 days, then use 1 spray in each nostril daily. 5)  Please schedule a follow-up appointment in 1 month if not better.  Prescriptions: FLUTICASONE PROPIONATE 50 MCG/ACT SUSP (FLUTICASONE PROPIONATE) 1 spray each nostril daily  #1 x 3   Entered and Authorized by:   Romero Belling MD   Signed by:   Romero Belling MD on 04/13/2009   Method used:   [facility] to        [facility] [address] [phone]* (retail)       [address]       [place], [place]  [address]       Ph: [phone] or [phone]       Fax: [phone]   RxID:   [id number]

## 2010-02-07 NOTE — Progress Notes (Signed)
  Phone Note Call from Patient   Caller: Patient Summary of Call: Patient having vaginial itching from ? soap.  Please call her [phone] Initial call taken by: Britta Mccreedy mcgregor  Follow-up for Phone Call        used a different soap & now has vaginal itching. to ocme in now so she can be seen before she goes to work Follow-up by: Golden Circle RN,  September 07, 2009 9:49 AM

## 2010-02-07 NOTE — Letter (Signed)
Summary: Generic Letter  [facility]  [address]   [place], [place] [address]   Phone: [phone]  Fax: [phone]    05/05/2009  Rachel Cortez [address] [place], [place]  [address]  Dear [name],  I am happy to inform you that your gonorrhea and chlamydia tests were negative.  If you have any questions, please call our office.          Sincerely,   Ardeen Garland  MD  Appended Document: Generic Letter mailed.

## 2010-02-07 NOTE — Assessment & Plan Note (Signed)
Summary: low energy/iron,df   Vital Signs:  Patient profile:   45 year old female Height:      68.5 inches Weight:      227.5 pounds BMI:     34.21 Temp:     98.5 degrees F oral Pulse rate:   78 / minute BP sitting:   114 / 70  (left arm) Cuff size:   regular  Vitals Entered By: Garen Grams LPN (May 17, 2009 11:03 AM) CC: low energy, breast tenderness, nausea x 1 week, Fatigue Is Patient Diabetic? No Pain Assessment Patient in pain? no        Primary Care Provider:  Milinda Antis MD  CC:  low energy, breast tenderness, nausea x 1 week, and Fatigue.  History of Present Illness:  Fatigue      This is a 45 year old woman who presents with Fatigue.  This started 1 wk ago. The patient complains of primarily physical fatigue.  The patient denies fever, exertional chest pain, dyspnea, cough, and new medications.  Other symptoms include daytime sleepiness.  The patient denies the following symptoms: leg swelling, melena, adenopathy, and severe snoring.  The patient denies anhedonia, feeling depressed, altered appetite, and poor sleep.    Habits & Providers  Alcohol-Tobacco-Diet     Tobacco Status: never  Current Problems (verified): 1)  Hirsutism  (ICD-704.1) 2)  Fatigue  (ICD-780.79) 3)  Nausea  (ICD-787.02) 4)  Vaginitis  (ICD-616.10) 5)  Hearing Loss, Conductive, Left  (ICD-389.00) 6)  Screening Examination For Pulmonary Tuberculosis  (ICD-V74.1) 7)  Bacterial Vaginitis  (ICD-616.10) 8)  Encounter For Long-term Use of Other Medications  (ICD-V58.69) 9)  Onychomycosis, Toenails  (ICD-110.1) 10)  Leukorrhea  (ICD-623.5) 11)  Screening For Malignant Neoplasm of The Cervix  (ICD-V76.2) 12)  Health Maintenance Exam  (ICD-V70.0) 13)  Dysfunctional Uterine Bleeding  (ICD-626.8) 14)  Patello Femoral Stress Syndrome  (ICD-719.46) 15)  Obesity, Nos  (ICD-278.00) 16)  Migraine, Unspec., w/o Intractable Migraine  (ICD-346.90) 17)  Hypercholesterolemia  (ICD-272.0)  Current  Medications (verified): 1)  Claritin 10 Mg Tabs (Loratadine) .... Take 1 Tablet By Mouth Once A Day 2)  Maxalt 10 Mg Tabs (Rizatriptan Benzoate) .... Take 1 Tablet By Mouth 3x A Week For Migraines 3)  Zofran 4 Mg Tabs (Ondansetron Hcl) .Marland Kitchen.. 1 By Mouth Every 6 Hours As Needed For Nausea and Vomiting 4)  Ibuprofen 800 Mg Tabs (Ibuprofen) .Marland Kitchen.. 1 By Mouth Three Times As Needed Pain 5)  Terbinafine Hcl 250 Mg Tabs (Terbinafine Hcl) .Marland Kitchen.. 1 By Mouth Daily X 12 Weeks Dispense: Qs 6)  Fish Oil 1000 Mg Caps (Omega-3 Fatty Acids) .Marland Kitchen.. 1 Cap Two Times A Day 7)  Flagyl 500 Mg Tabs (Metronidazole) .Marland Kitchen.. 1 By Mouth Two Times A Day X 7 Days Do Not Take With Alcohol 8)  Fluticasone Propionate 50 Mcg/act Susp (Fluticasone Propionate) .Marland Kitchen.. 1 Spray Each Nostril Daily  Allergies (verified): No Known Drug Allergies  Past History:  Past Medical History: Last updated: Jun 01, 2007 Dysfunctional Uterine Bleeding - 626.8 Hirsutism - 704.1 Onychomycosis - 110.1 Rhinitis, Allergic - 477.9 Right tibial tuberosity growth - MRI pending  Past Surgical History: Last updated: 01/06/2008 BTL 1997 Colposcopy  08/2007  resolved LSIL EKG normal - 11/07/2001 endometrial biopsy 8/09 normal  Family History: Last updated: 06-01-2007 Father died from accident in her youth MGM with DM, Mother with HTN, high chol  Social History: Last updated: 01/06/2008 Single, lives with 2 kids.   Works as Materials engineer.Marland Kitchen  No  tobacco, etoh or drug use.   Drinks a lot of caffeine.  Walks.  Trying to lose wt.  Risk Factors: Smoking Status: never (05/17/2009)  Review of Systems  The patient denies anorexia, weight loss, weight gain, decreased hearing, chest pain, dyspnea on exertion, peripheral edema, headaches, and abdominal pain.    Physical Exam  General:  alert, well-developed, and well-nourished.   Head:  normocephalic and atraumatic.   Neck:  supple.   Lungs:  normal respiratory effort.   Heart:  normal rate and  regular rhythm.   Abdomen:  soft, non-tender, and normal bowel sounds.     Impression & Recommendations:  Problem # 1:  FATIGUE (ICD-780.79)  Orders: [facility]- Est Level  3 (09983) CBC w/Diff-[facility] (38250) FSH-[facility] (53976-73419) TSH-[facility] (37902-40973)  Problem # 2:  NAUSEA (ICD-787.02)  Her updated medication list for this problem includes:    Claritin 10 Mg Tabs (Loratadine) .Marland Kitchen... Take 1 tablet by mouth once a day    Zofran 4 Mg Tabs (Ondansetron hcl) .Marland Kitchen... 1 by mouth every 6 hours as needed for nausea and vomiting  Orders: U Preg-[facility] (81025) [facility]- Est Level  3 (53299)  Problem # 3:  HIRSUTISM (ICD-704.1)  Orders: Testosterone-[facility] (24268-34196)  Complete Medication List: 1)  Claritin 10 Mg Tabs (Loratadine) .... Take 1 tablet by mouth once a day 2)  Maxalt 10 Mg Tabs (Rizatriptan benzoate) .... Take 1 tablet by mouth 3x a week for migraines 3)  Zofran 4 Mg Tabs (Ondansetron hcl) .Marland Kitchen.. 1 by mouth every 6 hours as needed for nausea and vomiting 4)  Ibuprofen 800 Mg Tabs (Ibuprofen) .Marland Kitchen.. 1 by mouth three times as needed pain 5)  Terbinafine Hcl 250 Mg Tabs (Terbinafine hcl) .Marland Kitchen.. 1 by mouth daily x 12 weeks dispense: qs 6)  Fish Oil 1000 Mg Caps (Omega-3 fatty acids) .Marland Kitchen.. 1 cap two times a day 7)  Flagyl 500 Mg Tabs (Metronidazole) .Marland Kitchen.. 1 by mouth two times a day x 7 days do not take with alcohol 8)  Fluticasone Propionate 50 Mcg/act Susp (Fluticasone propionate) .Marland Kitchen.. 1 spray each nostril daily  Patient Instructions: 1)  Please schedule a follow-up appointment in 2 weeks with PCP.   Laboratory Results   Urine Tests  Date/Time Received: May 17, 2009 11:08 AM  Date/Time Reported: May 17, 2009 11:14 AM     Urine HCG: negative Comments: ...........test performed by...........Marland KitchenTerese Door, CMA

## 2010-02-09 NOTE — Miscellaneous (Signed)
  Clinical Lists Changes  Problems: Removed problem of OTHER DISORDERS OF SOFT TISSUE (ICD-729.99) Removed problem of CANDIDIASIS OF VULVA AND VAGINA (ICD-112.1) Removed problem of VAGINAL PRURITUS (ICD-698.1) Removed problem of COUGH (ICD-786.2) Removed problem of FATIGUE (ICD-780.79) Removed problem of NAUSEA (ICD-787.02) Removed problem of SCREENING EXAMINATION FOR PULMONARY TUBERCULOSIS (ICD-V74.1) Removed problem of BACTERIAL VAGINITIS (ICD-616.10) Removed problem of ENCOUNTER FOR LONG-TERM USE OF OTHER MEDICATIONS (ICD-V58.69) Removed problem of ONYCHOMYCOSIS, TOENAILS (ICD-110.1) Removed problem of SCREENING FOR MALIGNANT NEOPLASM OF THE CERVIX (ICD-V76.2) Removed problem of LEUKORRHEA (ICD-623.5) Removed problem of HEALTH MAINTENANCE EXAM (ICD-V70.0) Removed problem of DYSFUNCTIONAL UTERINE BLEEDING (ICD-626.8)

## 2010-04-10 ENCOUNTER — Ambulatory Visit (INDEPENDENT_AMBULATORY_CARE_PROVIDER_SITE_OTHER): Payer: Self-pay | Admitting: Family Medicine

## 2010-04-10 ENCOUNTER — Encounter: Payer: Self-pay | Admitting: Family Medicine

## 2010-04-10 DIAGNOSIS — M79643 Pain in unspecified hand: Secondary | ICD-10-CM

## 2010-04-10 DIAGNOSIS — E7800 Pure hypercholesterolemia, unspecified: Secondary | ICD-10-CM

## 2010-04-10 DIAGNOSIS — M79609 Pain in unspecified limb: Secondary | ICD-10-CM

## 2010-04-10 DIAGNOSIS — E669 Obesity, unspecified: Secondary | ICD-10-CM

## 2010-04-10 DIAGNOSIS — Z0000 Encounter for general adult medical examination without abnormal findings: Secondary | ICD-10-CM

## 2010-04-10 LAB — BASIC METABOLIC PANEL
BUN: 9 mg/dL (ref 6–23)
CO2: 25 mEq/L (ref 19–32)
Chloride: 106 mEq/L (ref 96–112)
Glucose, Bld: 97 mg/dL (ref 70–99)
Sodium: 139 mEq/L (ref 135–145)

## 2010-04-10 LAB — LIPID PANEL
Cholesterol: 177 mg/dL (ref 0–200)
HDL: 39 mg/dL — ABNORMAL LOW (ref >39)
LDL Cholesterol: 116 mg/dL — ABNORMAL HIGH (ref 0–99)
Total CHOL/HDL Ratio: 4.5 Ratio
Triglycerides: 110 mg/dL (ref <150)
VLDL: 22 mg/dL (ref 0–40)

## 2010-04-10 MED ORDER — OMEGA-3 FATTY ACIDS 1000 MG PO CAPS: 1.0000 g | ORAL_CAPSULE | Freq: Two times a day (BID) | ORAL | Status: DC

## 2010-04-10 MED ORDER — LORATADINE 10 MG PO TABS: 10.0000 mg | ORAL_TABLET | Freq: Every day | ORAL | Status: DC

## 2010-04-10 MED ORDER — RIZATRIPTAN BENZOATE 10 MG PO TABS: 10.0000 mg | ORAL_TABLET | ORAL | Status: DC | PRN

## 2010-04-10 NOTE — Patient Instructions (Signed)
Next visit is for PAP Smear  We will discuss your cholesterol and labs at the follow-up visit I recommend seeing an Eye doctor yearly I recommend seeing a Dentist yearly

## 2010-04-10 NOTE — Progress Notes (Signed)
  Subjective:    Patient ID: Rachel Cortez, female    DOB: 08-11-1965, 45 y.o.   MRN: [id number]  HPI Pt here for CPE for foster parenting, will return for PAP Smear       Concerns: small nodule on index finger right hand x 1 year, concerned its arthritis, no pain, no swelling, no change in the nodule size, able to use hand as normal, occ gets stiffness in hands but that occurs lower than that area   Hyperlipidemia- takes fish oil most days of week, started walking program down 4 pounds since our last visit      Currently on menses  Review of Systems  No chest pain, No SOB, no recent migraines, no change in bowel or bladder     Objective:   Physical Exam   GEN- NAD, alert and oriented, obese   HEENT- PERRL, EOMI, MMM, oropharynx clear, neck supple   Neck- no carotid bruits   CVS- RRR, no murmur  RESP- CTAB  ABD- soft, nt, nd  EXT- no edema, moving all 4 ext equally   Hands- Right and left hands- no swelling of joints, full ROM of bilat joints, grasp equal bilat, right 1st digit- small nodule palpated beneath skin near DIP, non tender, non fluctuant       Assessment & Plan:   1. CPE- exam performed, pt cleared for foster parenting, note most recent PPD performed by [facility], pt will submit her own documentation for this      RTC for PAP Smear, as she declined secondary to menses, Mammogram to be set up 2. Hyperlipidema- recheck, if LDL > 150 start Statin next visit, continue to work on weight loss 3. Obesity- down 4lbs, pt now restarting her walking programs  4. Hand pain- I dont see true evidence of an arthritic joint and this is not causing pain, possibly a callus on finger causing the nodular texture, if this changes or pt has hand pain then I would obtain plain films to evaluate joints

## 2010-04-17 ENCOUNTER — Other Ambulatory Visit: Payer: Self-pay | Admitting: Family Medicine

## 2010-04-17 ENCOUNTER — Telehealth: Payer: Self-pay | Admitting: Family Medicine

## 2010-04-17 DIAGNOSIS — Z1231 Encounter for screening mammogram for malignant neoplasm of breast: Secondary | ICD-10-CM

## 2010-04-17 NOTE — Telephone Encounter (Signed)
We were suppose to discuss at our next visit. Her cholesterol is improved Her bad cholesterol is down to 119 and total cholesterol is less than 200 , her value is  177. She does not require medications.

## 2010-04-17 NOTE — Telephone Encounter (Signed)
Would like results of her cholesterol

## 2010-04-17 NOTE — Telephone Encounter (Signed)
Called pt and informed of labs. Pt will schedule office visit with Dr.Edgar for pap. Arlyss Repress

## 2010-04-26 ENCOUNTER — Ambulatory Visit: Payer: Self-pay

## 2010-05-03 ENCOUNTER — Encounter: Payer: Self-pay | Admitting: Family Medicine

## 2010-05-15 ENCOUNTER — Ambulatory Visit (INDEPENDENT_AMBULATORY_CARE_PROVIDER_SITE_OTHER): Payer: Self-pay | Admitting: Family Medicine

## 2010-05-15 ENCOUNTER — Other Ambulatory Visit (HOSPITAL_COMMUNITY)
Admission: RE | Admit: 2010-05-15 | Discharge: 2010-05-15 | Disposition: A | Discharge status: Home / Self Care | Payer: Self-pay | Source: Ambulatory Visit | Attending: Family Medicine | Admitting: Family Medicine

## 2010-05-15 ENCOUNTER — Other Ambulatory Visit: Payer: Self-pay | Admitting: Family Medicine

## 2010-05-15 ENCOUNTER — Encounter: Payer: Self-pay | Admitting: Family Medicine

## 2010-05-15 ENCOUNTER — Telehealth: Payer: Self-pay | Admitting: Family Medicine

## 2010-05-15 VITALS — BP 117/81 | HR 78 | Temp 99.0°F | Wt 222.0 lb

## 2010-05-15 DIAGNOSIS — N926 Irregular menstruation, unspecified: Secondary | ICD-10-CM

## 2010-05-15 DIAGNOSIS — N6489 Other specified disorders of breast: Secondary | ICD-10-CM

## 2010-05-15 DIAGNOSIS — N63 Unspecified lump in unspecified breast: Secondary | ICD-10-CM

## 2010-05-15 DIAGNOSIS — E669 Obesity, unspecified: Secondary | ICD-10-CM

## 2010-05-15 DIAGNOSIS — Z01419 Encounter for gynecological examination (general) (routine) without abnormal findings: Secondary | ICD-10-CM | POA: Insufficient documentation

## 2010-05-15 DIAGNOSIS — Z124 Encounter for screening for malignant neoplasm of cervix: Secondary | ICD-10-CM

## 2010-05-15 MED ORDER — PROMETHAZINE HCL 12.5 MG PO TABS: 12.5000 mg | ORAL_TABLET | Freq: Four times a day (QID) | ORAL | Status: AC | PRN

## 2010-05-15 MED ORDER — RIZATRIPTAN BENZOATE 10 MG PO TABS: ORAL_TABLET | ORAL | Status: DC

## 2010-05-15 NOTE — Telephone Encounter (Signed)
Needed to change Sig for Maxalt

## 2010-05-15 NOTE — Telephone Encounter (Signed)
Pt called to make mammogram and she made the mistake of saying she had a little lump.  She now needs for [place] to make a [facility].

## 2010-05-15 NOTE — Telephone Encounter (Signed)
Called patient says she told the breast center that Dr Jeanice Lim told her she had a lump in her right breast so they will not schedule a screening mammogram, needs to be changed to a diagnostic.Busick, Rodena Medin  Do you know anything about this??

## 2010-05-15 NOTE — Patient Instructions (Addendum)
Reschedule your Mammogram appointment  We will send a letter with your PAP Smear results Continue your exercise program I recommend a Dental visit every 6 months, Eye Visit yearly Based on your PAP Smear results- we will discuss if you need to go to Colonoscopy clinic

## 2010-05-15 NOTE — Progress Notes (Signed)
  Subjective:    Patient ID: Rachel Cortez, female    DOB: Jun 17, 1965, 45 y.o.   MRN: [id number]  HPI CPE - Pt here for CPE , no complaints    PAP Smear- has had history of abnormal PAP Smears in the past   LMP- 04/25/10 Has very irregular menses- has had work up approx 2 years ago, with ultrasound, Colposcopy and endometrial biopsy, cycle for the past year has been every 17-21 days  Obesity- continues to work on weight loss, down another 5lbs since last visit, to start her stationary bike, runs around the house with her 55 month old foster child  Needs to reschedule her Mammogram    Review of Systems - no dysuria, no vaginal discharge, no abd pain, no CP, history of Migraine, none recently- needs anti-nausea medication prescribed, no joint pain,no recent illness     Objective:   Physical Exam  Constitutional: She appears well-developed and well-nourished. No distress.  Cardiovascular: Normal rate, regular rhythm and normal heart sounds.   No murmur heard. Pulmonary/Chest: Effort normal and breath sounds normal. No respiratory distress.  Abdominal: Soft. Bowel sounds are normal. She exhibits no distension. There is no tenderness.  Genitourinary: Vagina normal and uterus normal. No breast swelling, tenderness or discharge. Cervix exhibits no motion tenderness, no discharge and no friability. Right adnexum displays no tenderness. Left adnexum displays no tenderness.         Breast- fibrous feeling breast bilat, near 3 o clock position where aerola meets normal skin- small 2mm hardened fibrotic lesion, no erythema, no lesions felt on deep palpation  Cervix- mild bleeding with manipulation of PAP Smear brush, small white plaques in the lesions noted on imaging, not friable    Lymphadenopathy:       Right axillary: No pectoral and no lateral adenopathy present.       Left axillary: No pectoral and no lateral adenopathy present.       Ext - no edema   Assessment & Plan:  CPE - PAP  Smear done, as the plaque areas are not near the transformation zone , may be hardened vaginal secretions, sample taken, pt not sexually active, will await PAP Smear, if there is any concern will send for Colpo  Breast nodule- pt has very fibrous feeling breast, the small lesion at the areaola feels similar will set up for Mammogram  Obesity- Improving, with diet and pt to start exercise routine   Irregular menses- long standing problem for pt she has had work-up which has been negative, if cycles become more frequent or she has prolonged bleeding will obtain repeat U/S and endometrial biopsy.

## 2010-05-16 ENCOUNTER — Telehealth: Payer: Self-pay | Admitting: *Deleted

## 2010-05-16 ENCOUNTER — Other Ambulatory Visit: Payer: Self-pay | Admitting: Family Medicine

## 2010-05-16 DIAGNOSIS — N63 Unspecified lump in unspecified breast: Secondary | ICD-10-CM

## 2010-05-16 DIAGNOSIS — N926 Irregular menstruation, unspecified: Secondary | ICD-10-CM | POA: Insufficient documentation

## 2010-05-16 NOTE — Telephone Encounter (Signed)
She has a small fibrocystic region on the right breast, I placed an order for diagnostic Mammogram, please set up and let pt know

## 2010-05-16 NOTE — Telephone Encounter (Signed)
Called and informed patient of [facility] on May 19, 2010 at 3:10. Patient says she would not be able to make that appointment and that she would call to reschedule.Busick, Rodena Medin

## 2010-05-18 ENCOUNTER — Encounter: Payer: Self-pay | Admitting: Family Medicine

## 2010-05-19 ENCOUNTER — Other Ambulatory Visit: Payer: Self-pay

## 2010-05-22 ENCOUNTER — Ambulatory Visit
Admission: RE | Admit: 2010-05-22 | Discharge: 2010-05-22 | Disposition: A | Discharge status: Home / Self Care | Payer: Self-pay | Source: Ambulatory Visit | Attending: Family Medicine | Admitting: Family Medicine

## 2010-05-22 ENCOUNTER — Other Ambulatory Visit: Payer: Self-pay

## 2010-05-22 DIAGNOSIS — N63 Unspecified lump in unspecified breast: Secondary | ICD-10-CM

## 2010-07-24 ENCOUNTER — Inpatient Hospital Stay (INDEPENDENT_AMBULATORY_CARE_PROVIDER_SITE_OTHER)
Admission: RE | Admit: 2010-07-24 | Discharge: 2010-07-24 | Disposition: A | Discharge status: Home / Self Care | Payer: Self-pay | Source: Ambulatory Visit | Attending: Family Medicine | Admitting: Family Medicine

## 2010-07-24 ENCOUNTER — Ambulatory Visit (INDEPENDENT_AMBULATORY_CARE_PROVIDER_SITE_OTHER): Payer: Self-pay

## 2010-07-24 DIAGNOSIS — M779 Enthesopathy, unspecified: Secondary | ICD-10-CM

## 2011-01-10 ENCOUNTER — Emergency Department (INDEPENDENT_AMBULATORY_CARE_PROVIDER_SITE_OTHER)
Admission: EM | Admit: 2011-01-10 | Discharge: 2011-01-10 | Disposition: A | Discharge status: Home / Self Care | Payer: Self-pay | Source: Home / Self Care | Attending: Emergency Medicine | Admitting: Emergency Medicine

## 2011-01-10 ENCOUNTER — Encounter (HOSPITAL_COMMUNITY): Payer: Self-pay | Admitting: *Deleted

## 2011-01-10 DIAGNOSIS — T148XXA Other injury of unspecified body region, initial encounter: Secondary | ICD-10-CM

## 2011-01-10 MED ORDER — IBUPROFEN 800 MG PO TABS: 800.0000 mg | ORAL_TABLET | Freq: Three times a day (TID) | ORAL | Status: AC | PRN

## 2011-01-10 MED ORDER — CYCLOBENZAPRINE HCL 10 MG PO TABS: 10.0000 mg | ORAL_TABLET | Freq: Every evening | ORAL | Status: AC | PRN

## 2011-01-10 NOTE — ED Notes (Signed)
Pt  Had  URI    Last  Week      She  Did  A  Lot  Of  Coughing  -  At  This time  She  Has  Developed    Some l  Sided  Back pain    Which  Is  Worse  On movement  And  Certain positions          She  denys  Any  Specific  Injury

## 2011-01-10 NOTE — ED Provider Notes (Addendum)
History     CSN: [id number]  Arrival date & time 01/10/11  1055   First MD Initiated Contact with Patient 01/10/11 1309      Chief Complaint  Patient presents with  . Back Pain    (Consider location/radiation/quality/duration/timing/severity/associated sxs/prior treatment) HPI Comments: Last week was congested with a lot of cough, the last few days my lower back on the Left side has been hurting, its tender when i move or lean forward, No urinary symptoms, No SOB, No fevers  Patient is a 46 y.o. female presenting with back pain. The history is provided by the patient.  Back Pain  This is a new problem. The current episode started more than 2 days ago. The problem occurs constantly. The problem has not changed since onset.The pain is present in the lumbar spine. The quality of the pain is described as aching. The pain does not radiate. The pain is at a severity of 7/10. The pain is moderate. The symptoms are aggravated by bending, twisting and certain positions. The pain is the same all the time. Pertinent negatives include no fever, no numbness, no abdominal pain, no bowel incontinence, no perianal numbness, no bladder incontinence, no dysuria, no paresthesias, no paresis, no tingling and no weakness. She has tried heat for the symptoms. The treatment provided mild relief.    Past Medical History  Diagnosis Date  . Migraine   . Obesity   . Allergy   . Osgood-Schlatter/osteochondroses     History reviewed. No pertinent past surgical history.  History reviewed. No pertinent family history.  History  Substance Use Topics  . Smoking status: Never Smoker   . Smokeless tobacco: Never Used  . Alcohol Use: Not on file    OB History    Grav Para Term Preterm Abortions TAB SAB Ect Mult Living                  Review of Systems  Constitutional: Negative for fever, chills, appetite change and fatigue.  Respiratory: Positive for cough.   Gastrointestinal: Negative for abdominal  pain and bowel incontinence.  Genitourinary: Negative for bladder incontinence and dysuria.  Musculoskeletal: Positive for back pain.  Neurological: Negative for tingling, weakness, numbness and paresthesias.    Allergies  Codeine and Penicillins  Home Medications   Current Outpatient Rx  Name Route Sig Dispense Refill  . CYCLOBENZAPRINE HCL 10 MG PO TABS Oral Take 1 tablet (10 mg total) by mouth at bedtime as needed for muscle spasms. 10 tablet 0  . OMEGA-3 FATTY ACIDS 1000 MG PO CAPS Oral Take 1 capsule (1 g total) by mouth 2 (two) times daily. 60 capsule 6  . IBUPROFEN 800 MG PO TABS Oral Take 1 tablet (800 mg total) by mouth every 8 (eight) hours as needed for pain. 21 tablet 0  . LORATADINE 10 MG PO TABS Oral Take 1 tablet (10 mg total) by mouth daily. 30 tablet 6  . RIZATRIPTAN BENZOATE 10 MG PO TABS  Take 10 mg by mouth at onset of headache, may repeat in 2 hours if needed for migraine       BP 151/90  Pulse 64  Temp(Src) 98.2 F (36.8 C) (Oral)  Resp 18  SpO2 96%  Physical Exam  Nursing note and vitals reviewed. Constitutional: She appears well-developed and well-nourished.  HENT:  Head: Normocephalic.  Pulmonary/Chest: Breath sounds normal. She has no decreased breath sounds. She has no wheezes. She has no rhonchi. She has no rales.  Abdominal: There  is tenderness.  Musculoskeletal: She exhibits tenderness.       Lumbar back: She exhibits decreased range of motion and tenderness. She exhibits no swelling and no spasm.       Back:  Neurological: She is alert.  Skin: Skin is warm. No rash noted. No erythema.    ED Course  Procedures (including critical care time)  Labs Reviewed - No data to display No results found.   1. Muscle strain       MDM  Reproducible Lumbar pain-Resolved respiratory symptoms        Jimmie Molly, MD 01/10/11 1723  Jimmie Molly, MD 01/10/11 1724

## 2011-02-23 ENCOUNTER — Telehealth: Payer: Self-pay | Admitting: *Deleted

## 2011-02-23 NOTE — Telephone Encounter (Signed)
Received call from the [facility] asking how many refills for the Maxalt Dr Alvester Morin sent in. The Rx was faxed in and they were unable to read weather it was a 6 or 0. I told them no refills because the patient was last seen by Dr Jeanice Lim May of 2012.Laverle Pillard, Rodena Medin

## 2011-03-19 ENCOUNTER — Ambulatory Visit: Payer: Self-pay | Admitting: Family Medicine

## 2011-05-31 ENCOUNTER — Encounter (HOSPITAL_COMMUNITY): Payer: Self-pay | Admitting: Emergency Medicine

## 2011-05-31 ENCOUNTER — Emergency Department (INDEPENDENT_AMBULATORY_CARE_PROVIDER_SITE_OTHER)
Admission: EM | Admit: 2011-05-31 | Discharge: 2011-05-31 | Disposition: A | Discharge status: Home / Self Care | Payer: Self-pay | Source: Home / Self Care | Attending: Emergency Medicine | Admitting: Emergency Medicine

## 2011-05-31 DIAGNOSIS — M549 Dorsalgia, unspecified: Secondary | ICD-10-CM

## 2011-05-31 MED ORDER — HYDROCODONE-ACETAMINOPHEN 5-500 MG PO TABS: 1.0000 | ORAL_TABLET | Freq: Four times a day (QID) | ORAL | Status: AC | PRN

## 2011-05-31 MED ORDER — CYCLOBENZAPRINE HCL 10 MG PO TABS: 10.0000 mg | ORAL_TABLET | Freq: Three times a day (TID) | ORAL | Status: AC | PRN

## 2011-05-31 NOTE — Discharge Instructions (Signed)
If pain persists beyond 2-3 weeks for followup with her primary care Dr. or the orthopedic doctors she will need probable physical therapy and further evaluation.   Back Pain, Adult Low back pain is very common. About 1 in 5 people have back pain.The cause of low back pain is rarely dangerous. The pain often gets better over time.About half of people with a sudden onset of back pain feel better in just 2 weeks. About 8 in 10 people feel better by 6 weeks.  CAUSES Some common causes of back pain include:  Strain of the muscles or ligaments supporting the spine.   Wear and tear (degeneration) of the spinal discs.   Arthritis.   Direct injury to the back.  DIAGNOSIS Most of the time, the direct cause of low back pain is not known.However, back pain can be treated effectively even when the exact cause of the pain is unknown.Answering your caregiver's questions about your overall health and symptoms is one of the most accurate ways to make sure the cause of your pain is not dangerous. If your caregiver needs more information, he or she may order lab work or imaging tests (X-rays or MRIs).However, even if imaging tests show changes in your back, this usually does not require surgery. HOME CARE INSTRUCTIONS For many people, back pain returns.Since low back pain is rarely dangerous, it is often a condition that people can learn to [facility] their own.   Remain active. It is stressful on the back to sit or stand in one place. Do not sit, drive, or stand in one place for more than 30 minutes at a time. Take short walks on level surfaces as soon as pain allows.Try to increase the length of time you walk each day.   Do not stay in bed.Resting more than 1 or 2 days can delay your recovery.   Do not avoid exercise or work.Your body is made to move.It is not dangerous to be active, even though your back may hurt.Your back will likely heal faster if you return to being active before your pain is  gone.   Pay attention to your body when you bend and lift. Many people have less discomfortwhen lifting if they bend their knees, keep the load close to their bodies,and avoid twisting. Often, the most comfortable positions are those that put less stress on your recovering back.   Find a comfortable position to sleep. Use a firm mattress and lie on your side with your knees slightly bent. If you lie on your back, put a pillow under your knees.   Only take over-the-counter or prescription medicines as directed by your caregiver. Over-the-counter medicines to reduce pain and inflammation are often the most helpful.Your caregiver may prescribe muscle relaxant drugs.These medicines help dull your pain so you can more quickly return to your normal activities and healthy exercise.   Put ice on the injured area.   Put ice in a plastic bag.   Place a towel between your skin and the bag.   Leave the ice on for 15 to 20 minutes, 3 to 4 times a day for the first 2 to 3 days. After that, ice and heat may be alternated to reduce pain and spasms.   Ask your caregiver about trying back exercises and gentle massage. This may be of some benefit.   Avoid feeling anxious or stressed.Stress increases muscle tension and can worsen back pain.It is important to recognize when you are anxious or stressed and learn ways  to manage it.Exercise is a great option.  SEEK MEDICAL CARE IF:  You have pain that is not relieved with rest or medicine.   You have pain that does not improve in 1 week.   You have new symptoms.   You are generally not feeling well.  SEEK IMMEDIATE MEDICAL CARE IF:   You have pain that radiates from your back into your legs.   You develop new bowel or bladder control problems.   You have unusual weakness or numbness in your arms or legs.   You develop nausea or vomiting.   You develop abdominal pain.   You feel faint.  Document Released: 12/25/2004 Document Revised:  12/14/2010 Document Reviewed: 05/15/2010 [facility] Patient Information 2012 [place], [place].Back Exercises Back exercises help treat and prevent back injuries. The goal is to increase your strength in your belly (abdominal) and back muscles. These exercises can also help with flexibility. Start these exercises when told by your doctor. HOME CARE Back exercises include: Pelvic Tilt.  Lie on your back with your knees bent. Tilt your pelvis until the lower part of your back is against the floor. Hold this position 5 to 10 sec. Repeat this exercise 5 to 10 times.  Knee to Chest.  Pull 1 knee up against your chest and hold for 20 to 30 seconds. Repeat this with the other knee. This may be done with the other leg straight or bent, whichever feels better. Then, pull both knees up against your chest.  Sit-Ups or Curl-Ups.  Bend your knees 90 degrees. Start with tilting your pelvis, and do a partial, slow sit-up. Only lift your upper half 30 to 45 degrees off the floor. Take at least 2 to 3 seonds for each sit-up. Do not do sit-ups with your knees out straight. If partial sit-ups are difficult, simply do the above but with only tightening your belly (abdominal) muscles and holding it as told.  Hip-Lift.  Lie on your back with your knees flexed 90 degrees. Push down with your feet and shoulders as you raise your hips 2 inches off the floor. Hold for 10 seconds, repeat 5 to 10 times.  Back Arches.  Lie on your stomach. Prop yourself up on bent elbows. Slowly press on your hands, causing an arch in your low back. Repeat 3 to 5 times.  Shoulder-Lifts.  Lie face down with arms beside your body. Keep hips and belly pressed to floor as you slowly lift your head and shoulders off the floor.  Do not overdo your exercises. Be careful in the beginning. Exercises may cause you some mild back discomfort. If the pain lasts for more than 15 minutes, stop the exercises until you see your doctor. Improvement with  exercise for back problems is slow.  Document Released: 01/27/2010 Document Revised: 12/14/2010 Document Reviewed: 01/27/2010 [facility] Patient Information 2012 [place], [place].

## 2011-05-31 NOTE — ED Provider Notes (Signed)
History     CSN: [id number]  Arrival date & time 05/31/11  1436   First MD Initiated Contact with Patient 05/31/11 1446      Chief Complaint  Patient presents with  . Back Injury    (Consider location/radiation/quality/duration/timing/severity/associated sxs/prior treatment) HPI Comments: Patient was at work and that she was lifting a table to get down from a pallet she felt a sudden pull or strain on her right lower lumbar region. She describes as a spasm or pulled muscle sensation that goes all down to her right buttock. Character the pain is sharp and has been taking some Motrin over-the-counter for relief is not helping her much. Patient denies any numbness, tingling, or weakness of any lower extremities. Patient denies any constitutional symptoms such as fever, intentional weight loss or fatigue or anorexia.  The history is provided by the patient.    Past Medical History  Diagnosis Date  . Migraine   . Obesity   . Allergy   . Osgood-Schlatter/osteochondroses     History reviewed. No pertinent past surgical history.  No family history on file.  History  Substance Use Topics  . Smoking status: Never Smoker   . Smokeless tobacco: Never Used  . Alcohol Use: No    OB History    Grav Para Term Preterm Abortions TAB SAB Ect Mult Living                  Review of Systems  Constitutional: Negative for fever, chills, appetite change, fatigue and unexpected weight change.  Genitourinary: Negative for dysuria, urgency, frequency, flank pain, difficulty urinating and pelvic pain.  Musculoskeletal: Positive for back pain. Negative for joint swelling and gait problem.    Allergies  Codeine and Penicillins  Home Medications   Current Outpatient Rx  Name Route Sig Dispense Refill  . FEXOFENADINE HCL 60 MG PO TABS Oral Take 60 mg by mouth daily.    . IBUPROFEN 800 MG PO TABS Oral Take 800 mg by mouth every 8 (eight) hours as needed.    . CYCLOBENZAPRINE HCL 10 MG PO  TABS Oral Take 1 tablet (10 mg total) by mouth 3 (three) times daily as needed for muscle spasms. 15 tablet 0  . OMEGA-3 FATTY ACIDS 1000 MG PO CAPS Oral Take 1 capsule (1 g total) by mouth 2 (two) times daily. 60 capsule 6  . HYDROCODONE-ACETAMINOPHEN 5-500 MG PO TABS Oral Take 1 tablet by mouth every 6 (six) hours as needed for pain. 10 tablet 0  . LORATADINE 10 MG PO TABS Oral Take 1 tablet (10 mg total) by mouth daily. 30 tablet 6  . RIZATRIPTAN BENZOATE 10 MG PO TABS  Take 10 mg by mouth at onset of headache, may repeat in 2 hours if needed for migraine       BP 115/70  Pulse 76  Temp(Src) 98.1 F (36.7 C) (Oral)  Resp 18  SpO2 98%  LMP 05/29/2011  Physical Exam  Nursing note and vitals reviewed. Constitutional: She appears well-developed and well-nourished.  Musculoskeletal: She exhibits tenderness.       Lumbar back: She exhibits tenderness, bony tenderness and pain. She exhibits no edema, no deformity and no laceration.       Back:  Neurological: She is alert. No cranial nerve deficit.  Skin: No rash noted. No erythema.    ED Course  Procedures (including critical care time)  Labs Reviewed - No data to display No results found.   1. Back pain, acute  MDM  Patient with acute lumbar sprain strain. Symptomatic management and guided back exercises after some improvement. Patient without any neurological symptoms or deficits        Jimmie Molly, MD 05/31/11 1700

## 2011-05-31 NOTE — ED Notes (Signed)
PT HERE WITH RIGHT LOW BACK PAIN RADIATING DOWN BUTTOCK AND LEG AFTER PULLED MUSCLE X 2 DYS AGO.STATES SHE HAS PULLING CAFETERIA TABLES DOWN WHEN TABLE CAME DOWN TO FAST.SHARP,ACHY PAIN UNRELIEVED BY IBUPROFEN

## 2011-06-07 ENCOUNTER — Other Ambulatory Visit: Payer: Self-pay | Admitting: Family Medicine

## 2011-06-15 ENCOUNTER — Encounter: Payer: Self-pay | Admitting: Family Medicine

## 2011-07-06 ENCOUNTER — Ambulatory Visit (INDEPENDENT_AMBULATORY_CARE_PROVIDER_SITE_OTHER): Payer: Self-pay | Admitting: Family Medicine

## 2011-07-06 ENCOUNTER — Encounter: Payer: Self-pay | Admitting: Family Medicine

## 2011-07-06 VITALS — BP 117/76 | HR 69 | Ht 68.5 in | Wt 220.0 lb

## 2011-07-06 DIAGNOSIS — M549 Dorsalgia, unspecified: Secondary | ICD-10-CM

## 2011-07-06 DIAGNOSIS — Z833 Family history of diabetes mellitus: Secondary | ICD-10-CM

## 2011-07-06 DIAGNOSIS — E785 Hyperlipidemia, unspecified: Secondary | ICD-10-CM

## 2011-07-06 DIAGNOSIS — E669 Obesity, unspecified: Secondary | ICD-10-CM

## 2011-07-06 DIAGNOSIS — Z0000 Encounter for general adult medical examination without abnormal findings: Secondary | ICD-10-CM

## 2011-07-06 LAB — LIPID PANEL
HDL: 45 mg/dL (ref >39)
LDL Cholesterol: 129 mg/dL — ABNORMAL HIGH (ref 0–99)
Total CHOL/HDL Ratio: 4.3 Ratio
Triglycerides: 107 mg/dL (ref <150)
VLDL: 21 mg/dL (ref 0–40)

## 2011-07-06 LAB — BASIC METABOLIC PANEL
Calcium: 9 mg/dL (ref 8.4–10.5)
Creat: 0.88 mg/dL (ref 0.50–1.10)
Sodium: 139 mEq/L (ref 135–145)

## 2011-07-06 NOTE — Progress Notes (Signed)
  Subjective:    Patient ID: Rachel Cortez, female    DOB: November 09, 1965, 46 y.o.   MRN: [id number]  HPI PMH, Surgical history, Family History, allergies, medications and social history all updated under appropriate tabs.    Patient here for annual physical:  Health maintenance: Patient had Pap smear in May 2012 which was normal. Had 3 normal Pap smears prior to that. Patient due for repeat Pap in May 2015. Patient has family history of diabetes-agrees with A1c testing today. Patient has a history of borderline hypercholesterolemia-agrees with doing a lipid panel today.  Lower back pain: Patient reports long history of chronic back pain. Seen last at the clinic in May and diagnosed with sciatica that time. Pain resolved quickly. But since that time will have exacerbations where the pain will last a few days. Pain has recurred this past week. No new activity. No trauma to area. Pain is located across lower back and runs down the back of the legs. Describes the pain as sharp and shooting. No numbness tingling in legs. No bowel or bladder problems. No fever. No loss of sensation.  Weight management: Patient has been walking for the past 2 weeks-30 minutes 3 times per week. Patient eats 73 presentation was per day. Is frustrated that she has not been able to lose weight. States that she does eat unhealthy snacks.  Smoking status reviewed.   Review of Systems As per above.    Objective:   Physical Exam  Constitutional: She appears well-developed and well-nourished.       obese  HENT:  Head: Normocephalic and atraumatic.  Eyes: Pupils are equal, round, and reactive to light.  Cardiovascular: Normal rate, regular rhythm and normal heart sounds.   No murmur heard. Pulmonary/Chest: Effort normal. No respiratory distress. She has no wheezes.  Abdominal: Soft. She exhibits no distension. There is no tenderness. There is no rebound and no guarding.  Genitourinary: No breast swelling (no nodules or  lumps on exam bilateral, no skin or nipple changes. ), tenderness, discharge or bleeding.  Musculoskeletal: She exhibits no edema.       Back exam: Some + paraspinal muscle tenderness bilateral in lower back, some pain radiating down into buttocks and legs. Normal strength bilateral.  Normal sensation bilateral.  Normal reflexes in lower ext bilateral.   Neurological: She is alert.  Skin: No rash noted.  Psychiatric: She has a normal mood and affect.          Assessment & Plan:

## 2011-07-06 NOTE — Patient Instructions (Addendum)
Lab results: I will mail to you.   Back pain: I think that this is still the sciatica that is causing.  Do stretches/exercises below.  Use ibuprofen 600mg  3 x day as needed.  Use flexeril as needed at bedtime  Weight management: Keep up the walking. Try to do this daily.  Look at the plate method.  You can always meet with out nutritionist- just give her a call.   Return as needed.   Sciatica with Rehab The sciatic nerve runs from the back down the leg and is responsible for sensation and control of the muscles in the back (posterior) side of the thigh, lower leg, and foot. Sciatica is a condition that is characterized by inflammation of this nerve.  SYMPTOMS   Signs of nerve damage, including numbness and/or weakness along the posterior side of the lower extremity.   Pain in the back of the thigh that may also travel down the leg.   Pain that worsens when sitting for long periods of time.   Occasionally, pain in the back or buttock.  CAUSES  Inflammation of the sciatic nerve is the cause of sciatica. The inflammation is due to something irritating the nerve. Common sources of irritation include:  Sitting for long periods of time.   Direct trauma to the nerve.   Arthritis of the spine.   Herniated or ruptured disk.   Slipping of the vertebrae (spondylolithesis)   Pressure from soft tissues, such as muscles or ligament-like tissue (fascia).  RISK INCREASES WITH:  Sports that place pressure or stress on the spine (football or weightlifting).   Poor strength and flexibility.   Failure to warm-up properly before activity.   Family history of low back pain or disk disorders.   Previous back injury or surgery.   Poor body mechanics, especially when lifting, or poor posture.  PREVENTION   Warm up and stretch properly before activity.   Maintain physical fitness:   Strength, flexibility, and endurance.   Cardiovascular fitness.   Learn and use proper  technique, especially with posture and lifting. When possible, have coach correct improper technique.   Avoid activities that place stress on the spine.  PROGNOSIS If treated properly, then sciatica usually resolves within 6 weeks. However, occasionally surgery is necessary.  RELATED COMPLICATIONS   Permanent nerve damage, including pain, numbness, tingle, or weakness.   Chronic back pain.   Risks of surgery: infection, bleeding, nerve damage, or damage to surrounding tissues.  TREATMENT Treatment initially involves resting from any activities that aggravate your symptoms. The use of ice and medication may help reduce pain and inflammation. The use of strengthening and stretching exercises may help reduce pain with activity. These exercises may be performed at home or with referral to a therapist. A therapist may recommend further treatments, such as transcutaneous electronic nerve stimulation (TENS) or ultrasound. Your caregiver may recommend corticosteroid injections to help reduce inflammation of the sciatic nerve. If symptoms persist despite non-surgical (conservative) treatment, then surgery may be recommended. MEDICATION  If pain medication is necessary, then nonsteroidal anti-inflammatory medications, such as aspirin and ibuprofen, or other minor pain relievers, such as acetaminophen, are often recommended.   Do not take pain medication for 7 days before surgery.   Prescription pain relievers may be given if deemed necessary by your caregiver. Use only as directed and only as much as you need.   Ointments applied to the skin may be helpful.   Corticosteroid injections may be given by your caregiver. These  injections should be reserved for the most serious cases, because they may only be given a certain number of times.  HEAT AND COLD  Cold treatment (icing) relieves pain and reduces inflammation. Cold treatment should be applied for 10 to 15 minutes every 2 to 3 hours for  inflammation and pain and immediately after any activity that aggravates your symptoms. Use ice packs or massage the area with a piece of ice (ice massage).   Heat treatment may be used prior to performing the stretching and strengthening activities prescribed by your caregiver, physical therapist, or athletic trainer. Use a heat pack or soak the injury in warm water.  SEEK MEDICAL CARE IF:  Treatment seems to offer no benefit, or the condition worsens.   Any medications produce adverse side effects.  EXERCISES  RANGE OF MOTION (ROM) AND STRETCHING EXERCISES - Sciatica Most people with sciatic will find that their symptoms worsen with either excessive bending forward (flexion) or arching at the low back (extension). The exercises which will help resolve your symptoms will focus on the opposite motion. Your physician, physical therapist or athletic trainer will help you determine which exercises will be most helpful to resolve your low back pain. Do not complete any exercises without first consulting with your clinician. Discontinue any exercises which worsen your symptoms until you speak to your clinician. If you have pain, numbness or tingling which travels down into your buttocks, leg or foot, the goal of the therapy is for these symptoms to move closer to your back and eventually resolve. Occasionally, these leg symptoms will get better, but your low back pain may worsen; this is typically an indication of progress in your rehabilitation. Be certain to be very alert to any changes in your symptoms and the activities in which you participated in the 24 hours prior to the change. Sharing this information with your clinician will allow him/her to most efficiently treat your condition. These exercises may help you when beginning to rehabilitate your injury. Your symptoms may resolve with or without further involvement from your physician, physical therapist or athletic trainer. While completing these  exercises, remember:   Restoring tissue flexibility helps normal motion to return to the joints. This allows healthier, less painful movement and activity.   An effective stretch should be held for at least 30 seconds.   A stretch should never be painful. You should only feel a gentle lengthening or release in the stretched tissue.  FLEXION RANGE OF MOTION AND STRETCHING EXERCISES: STRETCH - Flexion, Single Knee to Chest   Lie on a firm bed or floor with both legs extended in front of you.   Keeping one leg in contact with the floor, bring your opposite knee to your chest. Hold your leg in place by either grabbing behind your thigh or at your knee.   Pull until you feel a gentle stretch in your low back. Hold __________ seconds.   Slowly release your grasp and repeat the exercise with the opposite side.  Repeat __________ times. Complete this exercise __________ times per day.  STRETCH - Flexion, Double Knee to Chest  Lie on a firm bed or floor with both legs extended in front of you.   Keeping one leg in contact with the floor, bring your opposite knee to your chest.   Tense your stomach muscles to support your back and then lift your other knee to your chest. Hold your legs in place by either grabbing behind your thighs or at your knees.  Pull both knees toward your chest until you feel a gentle stretch in your low back. Hold __________ seconds.   Tense your stomach muscles and slowly return one leg at a time to the floor.  Repeat __________ times. Complete this exercise __________ times per day.  STRETCH - Low Trunk Rotation   Lie on a firm bed or floor. Keeping your legs in front of you, bend your knees so they are both pointed toward the ceiling and your feet are flat on the floor.   Extend your arms out to the side. This will stabilize your upper body by keeping your shoulders in contact with the floor.   Gently and slowly drop both knees together to one side until you feel  a gentle stretch in your low back. Hold for __________ seconds.   Tense your stomach muscles to support your low back as you bring your knees back to the starting position. Repeat the exercise to the other side.  Repeat __________ times. Complete this exercise __________ times per day  EXTENSION RANGE OF MOTION AND FLEXIBILITY EXERCISES: STRETCH - Extension, Prone on Elbows  Lie on your stomach on the floor, a bed will be too soft. Place your palms about shoulder width apart and at the height of your head.   Place your elbows under your shoulders. If this is too painful, stack pillows under your chest.   Allow your body to relax so that your hips drop lower and make contact more completely with the floor.   Hold this position for __________ seconds.   Slowly return to lying flat on the floor.  Repeat __________ times. Complete this exercise __________ times per day.  RANGE OF MOTION - Extension, Prone Press Ups  Lie on your stomach on the floor, a bed will be too soft. Place your palms about shoulder width apart and at the height of your head.   Keeping your back as relaxed as possible, slowly straighten your elbows while keeping your hips on the floor. You may adjust the placement of your hands to maximize your comfort. As you gain motion, your hands will come more underneath your shoulders.   Hold this position __________ seconds.   Slowly return to lying flat on the floor.  Repeat __________ times. Complete this exercise __________ times per day.  STRENGTHENING EXERCISES - Sciatica  These exercises may help you when beginning to rehabilitate your injury. These exercises should be done near your "sweet spot." This is the neutral, low-back arch, somewhere between fully rounded and fully arched, that is your least painful position. When performed in this safe range of motion, these exercises can be used for people who have either a flexion or extension based injury. These exercises may  resolve your symptoms with or without further involvement from your physician, physical therapist or athletic trainer. While completing these exercises, remember:   Muscles can gain both the endurance and the strength needed for everyday activities through controlled exercises.   Complete these exercises as instructed by your physician, physical therapist or athletic trainer. Progress with the resistance and repetition exercises only as your caregiver advises.   You may experience muscle soreness or fatigue, but the pain or discomfort you are trying to eliminate should never worsen during these exercises. If this pain does worsen, stop and make certain you are following the directions exactly. If the pain is still present after adjustments, discontinue the exercise until you can discuss the trouble with your clinician.  STRENGTHENING - Deep  Abdominals, Pelvic Tilt   Lie on a firm bed or floor. Keeping your legs in front of you, bend your knees so they are both pointed toward the ceiling and your feet are flat on the floor.   Tense your lower abdominal muscles to press your low back into the floor. This motion will rotate your pelvis so that your tail bone is scooping upwards rather than pointing at your feet or into the floor.   With a gentle tension and even breathing, hold this position for __________ seconds.  Repeat __________ times. Complete this exercise __________ times per day.  STRENGTHENING - Abdominals, Crunches   Lie on a firm bed or floor. Keeping your legs in front of you, bend your knees so they are both pointed toward the ceiling and your feet are flat on the floor. Cross your arms over your chest.   Slightly tip your chin down without bending your neck.   Tense your abdominals and slowly lift your trunk high enough to just clear your shoulder blades. Lifting higher can put excessive stress on the low back and does not further strengthen your abdominal muscles.   Control your  return to the starting position.  Repeat __________ times. Complete this exercise __________ times per day.  STRENGTHENING - Quadruped, Opposite UE/LE Lift  Assume a hands and knees position on a firm surface. Keep your hands under your shoulders and your knees under your hips. You may place padding under your knees for comfort.   Find your neutral spine and gently tense your abdominal muscles so that you can maintain this position. Your shoulders and hips should form a rectangle that is parallel with the floor and is not twisted.   Keeping your trunk steady, lift your right hand no higher than your shoulder and then your left leg no higher than your hip. Make sure you are not holding your breath. Hold this position __________ seconds.   Continuing to keep your abdominal muscles tense and your back steady, slowly return to your starting position. Repeat with the opposite arm and leg.  Repeat __________ times. Complete this exercise __________ times per day.  STRENGTHENING - Abdominals and Quadriceps, Straight Leg Raise   Lie on a firm bed or floor with both legs extended in front of you.   Keeping one leg in contact with the floor, bend the other knee so that your foot can rest flat on the floor.   Find your neutral spine, and tense your abdominal muscles to maintain your spinal position throughout the exercise.   Slowly lift your straight leg off the floor about 6 inches for a count of 15, making sure to not hold your breath.   Still keeping your neutral spine, slowly lower your leg all the way to the floor.  Repeat this exercise with each leg __________ times. Complete this exercise __________ times per day. POSTURE AND BODY MECHANICS CONSIDERATIONS - Sciatica Keeping correct posture when sitting, standing or completing your activities will reduce the stress put on different body tissues, allowing injured tissues a chance to heal and limiting painful experiences. The following are general  guidelines for improved posture. Your physician or physical therapist will provide you with any instructions specific to your needs. While reading these guidelines, remember:  The exercises prescribed by your provider will help you have the flexibility and strength to maintain correct postures.   The correct posture provides the optimal environment for your joints to work. All of your joints have less  wear and tear when properly supported by a spine with good posture. This means you will experience a healthier, less painful body.   Correct posture must be practiced with all of your activities, especially prolonged sitting and standing. Correct posture is as important when doing repetitive low-stress activities (typing) as it is when doing a single heavy-load activity (lifting).  RESTING POSITIONS Consider which positions are most painful for you when choosing a resting position. If you have pain with flexion-based activities (sitting, bending, stooping, squatting), choose a position that allows you to rest in a less flexed posture. You would want to avoid curling into a fetal position on your side. If your pain worsens with extension-based activities (prolonged standing, working overhead), avoid resting in an extended position such as sleeping on your stomach. Most people will find more comfort when they rest with their spine in a more neutral position, neither too rounded nor too arched. Lying on a non-sagging bed on your side with a pillow between your knees, or on your back with a pillow under your knees will often provide some relief. Keep in mind, being in any one position for a prolonged period of time, no matter how correct your posture, can still lead to stiffness. PROPER SITTING POSTURE In order to minimize stress and discomfort on your spine, you must sit with correct posture Sitting with good posture should be effortless for a healthy body. Returning to good posture is a gradual process. Many  people can work toward this most comfortably by using various supports until they have the flexibility and strength to maintain this posture on their own. When sitting with proper posture, your ears will fall over your shoulders and your shoulders will fall over your hips. You should use the back of the chair to support your upper back. Your low back will be in a neutral position, just slightly arched. You may place a small pillow or folded towel at the base of your low back for support.  When working at a desk, create an environment that supports good, upright posture. Without extra support, muscles fatigue and lead to excessive strain on joints and other tissues. Keep these recommendations in mind: CHAIR:   A chair should be able to slide under your desk when your back makes contact with the back of the chair. This allows you to work closely.   The chair's height should allow your eyes to be level with the upper part of your monitor and your hands to be slightly lower than your elbows.  BODY POSITION  Your feet should make contact with the floor. If this is not possible, use a foot rest.   Keep your ears over your shoulders. This will reduce stress on your neck and low back.  INCORRECT SITTING POSTURES   If you are feeling tired and unable to assume a healthy sitting posture, do not slouch or slump. This puts excessive strain on your back tissues, causing more damage and pain. Healthier options include:   Using more support, like a lumbar pillow.   Switching tasks to something that requires you to be upright or walking.   Talking a brief walk.   Lying down to rest in a neutral-spine position.  PROLONGED STANDING WHILE SLIGHTLY LEANING FORWARD  When completing a task that requires you to lean forward while standing in one place for a long time, place either foot up on a stationary 2-4 inch high object to help maintain the best posture. When both feet are  on the ground, the low back tends to  lose its slight inward curve. If this curve flattens (or becomes too large), then the back and your other joints will experience too much stress, fatigue more quickly and can cause pain.  CORRECT STANDING POSTURES Proper standing posture should be assumed with all daily activities, even if they only take a few moments, like when brushing your teeth. As in sitting, your ears should fall over your shoulders and your shoulders should fall over your hips. You should keep a slight tension in your abdominal muscles to brace your spine. Your tailbone should point down to the ground, not behind your body, resulting in an over-extended swayback posture.  INCORRECT STANDING POSTURES  Common incorrect standing postures include a forward head, locked knees and/or an excessive swayback. WALKING Walk with an upright posture. Your ears, shoulders and hips should all line-up. PROLONGED ACTIVITY IN A FLEXED POSITION When completing a task that requires you to bend forward at your waist or lean over a low surface, try to find a way to stabilize 3 of 4 of your limbs. You can place a hand or elbow on your thigh or rest a knee on the surface you are reaching across. This will provide you more stability so that your muscles do not fatigue as quickly. By keeping your knees relaxed, or slightly bent, you will also reduce stress across your low back. CORRECT LIFTING TECHNIQUES DO :   Assume a wide stance. This will provide you more stability and the opportunity to get as close as possible to the object which you are lifting.   Tense your abdominals to brace your spine; then bend at the knees and hips. Keeping your back locked in a neutral-spine position, lift using your leg muscles. Lift with your legs, keeping your back straight.   Test the weight of unknown objects before attempting to lift them.   Try to keep your elbows locked down at your sides in order get the best strength from your shoulders when carrying an  object.   Always ask for help when lifting heavy or awkward objects.  INCORRECT LIFTING TECHNIQUES DO NOT:   Lock your knees when lifting, even if it is a small object.   Bend and twist. Pivot at your feet or move your feet when needing to change directions.   Assume that you cannot safely pick up a paperclip without proper posture.  Document Released: 12/25/2004 Document Revised: 12/14/2010 Document Reviewed: 04/08/2008 [facility] Patient Information 2012 [place], [place].

## 2011-07-07 ENCOUNTER — Encounter: Payer: Self-pay | Admitting: Family Medicine

## 2011-07-08 DIAGNOSIS — M549 Dorsalgia, unspecified: Secondary | ICD-10-CM | POA: Insufficient documentation

## 2011-07-08 DIAGNOSIS — Z0000 Encounter for general adult medical examination without abnormal findings: Secondary | ICD-10-CM | POA: Insufficient documentation

## 2011-07-08 DIAGNOSIS — Z0289 Encounter for other administrative examinations: Secondary | ICD-10-CM | POA: Insufficient documentation

## 2011-07-08 NOTE — Assessment & Plan Note (Signed)
History and physical support diagnosis of sciatica.  Do stretches/exercises below.  Use ibuprofen 600mg  3 x day as needed.  Use flexeril as needed at bedtime. Return if no improvement in the next 2-3 weeks or sooner if new or worsening of symptoms and we can recheck and then reconsider if we should send you for formal PT at that time.

## 2011-07-08 NOTE — Assessment & Plan Note (Signed)
Patient had Pap smear in May 2012 which was normal. Had 3 normal Pap smears prior to that. Patient due for repeat Pap in May 2015. Patient has family history of diabetes-agrees with A1c testing today. Patient has a history of borderline hypercholesterolemia-agrees with doing a lipid panel today.

## 2011-07-08 NOTE — Assessment & Plan Note (Signed)
Weight management: Keep up the walking. Try to do this daily.  Discussed and gave handout on the plate method.  encouarged patient to meet with our nutritionist.

## 2011-08-07 ENCOUNTER — Ambulatory Visit (INDEPENDENT_AMBULATORY_CARE_PROVIDER_SITE_OTHER): Payer: Self-pay | Admitting: Family Medicine

## 2011-08-07 VITALS — Ht 68.5 in | Wt 220.0 lb

## 2011-08-07 DIAGNOSIS — M549 Dorsalgia, unspecified: Secondary | ICD-10-CM

## 2011-08-07 NOTE — Progress Notes (Signed)
  Subjective:    Patient ID: Rachel Cortez, female    DOB: January 09, 1965, 46 y.o.   MRN: [id number]  HPI  1. Low back pain. Pain began about 2.5 months ago after an injury, she was lowering heavy boxes and strained low back. Seen at [facility], had some improvement with NSAID and home exercises. Pain has returned now. Low back down both posterior thighs. Worse with some positions. She has decreased her physical activity-walking. She takes care of her young grandson who is a toddler, picks him up. Motrin yields some improvement.   She denies any weakness, numbness, incontinence, dysuria, falls, trauma, fevers, weight loss.   Review of Systems See HPI otherwise negative.  reports that she has never smoked. She has never used smokeless tobacco.     Objective:   Physical Exam  Vitals reviewed. Constitutional: She is oriented to person, place, and time. She appears well-developed and well-nourished. No distress.  HENT:  Head: Normocephalic and atraumatic.  Eyes: EOM are normal. Pupils are equal, round, and reactive to light.  Neck: Normal range of motion.  Pulmonary/Chest: Effort normal.  Abdominal: Soft.  Musculoskeletal: She exhibits tenderness. She exhibits no edema.       Bilateral lower lumbar paraspinal musculature TTP, near SI joint regions. Mild pain with lateral flexion on right low back. Full ROM intact. No bone tenderness. Negative straight leg testing. Negative FABER. Distal leg strength and sensation intact.   Neurological: She is alert and oriented to person, place, and time. She has normal reflexes. No cranial nerve deficit. She exhibits normal muscle tone. Coordination normal.       Normal gait  Skin: No rash noted. She is not diaphoretic.  Psychiatric: She has a normal mood and affect.       Assessment & Plan:

## 2011-08-07 NOTE — Patient Instructions (Addendum)
Nice to meet you. Will refer to physical therapy. You can take tylenol safely or motrin as needed. You can use heat therapy. Stay active as much as you can. Weight management can also help.  Body mass index is 32.96 kg/(m^2).  Lumbosacral Strain Lumbosacral strain is one of the most common causes of back pain. There are many causes of back pain. Most are not serious conditions. CAUSES  Your backbone (spinal column) is made up of 24 main vertebral bodies, the sacrum, and the coccyx. These are held together by muscles and tough, fibrous tissue (ligaments). Nerve roots pass through the openings between the vertebrae. A sudden move or injury to the back may cause injury to, or pressure on, these nerves. This may result in localized back pain or pain movement (radiation) into the buttocks, down the leg, and into the foot. Sharp, shooting pain from the buttock down the back of the leg (sciatica) is frequently associated with a ruptured (herniated) disk. Pain may be caused by muscle spasm alone. Your caregiver can often find the cause of your pain by the details of your symptoms and an exam. In some cases, you may need tests (such as X-rays). Your caregiver will work with you to decide if any tests are needed based on your specific exam. HOME CARE INSTRUCTIONS   Avoid an underactive lifestyle. Active exercise, as directed by your caregiver, is your greatest weapon against back pain.   Avoid hard physical activities (tennis, racquetball, waterskiing) if you are not in proper physical condition for it. This may aggravate or create problems.   If you have a back problem, avoid sports requiring sudden body movements. Swimming and walking are generally safer activities.   Maintain good posture.   Avoid becoming overweight (obese).   Use bed rest for only the most extreme, sudden (acute) episode. Your caregiver will help you determine how much bed rest is necessary.   For acute conditions, you may put  ice on the injured area.   Put ice in a plastic bag.   Place a towel between your skin and the bag.   Leave the ice on for 15 to 20 minutes at a time, every 2 hours, or as needed.   After you are improved and more active, it may help to apply heat for 30 minutes before activities.  See your caregiver if you are having pain that lasts longer than expected. Your caregiver can advise appropriate exercises or therapy if needed. With conditioning, most back problems can be avoided. SEEK IMMEDIATE MEDICAL CARE IF:   You have numbness, tingling, weakness, or problems with the use of your arms or legs.   You experience severe back pain not relieved with medicines.   There is a change in bowel or bladder control.   You have increasing pain in any area of the body, including your belly (abdomen).   You notice shortness of breath, dizziness, or feel faint.   You feel sick to your stomach (nauseous), are throwing up (vomiting), or become sweaty.   You notice discoloration of your toes or legs, or your feet get very cold.   Your back pain is getting worse.   You have a fever.  MAKE SURE YOU:   Understand these instructions.   Will watch your condition.   Will get help right away if you are not doing well or get worse.  Document Released: 10/04/2004 Document Revised: 12/14/2010 Document Reviewed: 03/26/2008 [facility] Patient Information 2012 [place], [place].

## 2011-08-07 NOTE — Assessment & Plan Note (Signed)
Recurrent lumbar sprain. No signs to suggest herniation or bone pathology. PT referral, continue conservative tylenol, NSAID, stretch and heat therapy. Discussed weight loss and increase in activity. F/u prn. Consider imaging if pain worsens or does not respond to PT.

## 2011-08-08 ENCOUNTER — Ambulatory Visit: Payer: Self-pay | Attending: Family Medicine | Admitting: Rehabilitative and Restorative Service Providers"

## 2011-08-08 DIAGNOSIS — M545 Low back pain, unspecified: Secondary | ICD-10-CM | POA: Insufficient documentation

## 2011-08-08 DIAGNOSIS — IMO0001 Reserved for inherently not codable concepts without codable children: Secondary | ICD-10-CM | POA: Insufficient documentation

## 2011-08-08 DIAGNOSIS — M2569 Stiffness of other specified joint, not elsewhere classified: Secondary | ICD-10-CM | POA: Insufficient documentation

## 2011-08-16 ENCOUNTER — Ambulatory Visit: Payer: Self-pay | Attending: Family Medicine | Admitting: Physical Therapy

## 2011-08-16 DIAGNOSIS — M2569 Stiffness of other specified joint, not elsewhere classified: Secondary | ICD-10-CM | POA: Insufficient documentation

## 2011-08-16 DIAGNOSIS — M545 Low back pain, unspecified: Secondary | ICD-10-CM | POA: Insufficient documentation

## 2011-08-16 DIAGNOSIS — IMO0001 Reserved for inherently not codable concepts without codable children: Secondary | ICD-10-CM | POA: Insufficient documentation

## 2011-08-20 ENCOUNTER — Ambulatory Visit: Payer: Self-pay | Admitting: Physical Therapy

## 2011-08-23 ENCOUNTER — Ambulatory Visit: Payer: Self-pay | Admitting: Physical Therapy

## 2011-08-27 ENCOUNTER — Encounter: Payer: Self-pay | Admitting: Rehabilitative and Restorative Service Providers"

## 2011-08-30 ENCOUNTER — Encounter: Payer: Self-pay | Admitting: Physical Therapy

## 2011-09-26 ENCOUNTER — Ambulatory Visit (HOSPITAL_COMMUNITY)
Admission: RE | Admit: 2011-09-26 | Discharge: 2011-09-26 | Disposition: A | Discharge status: Home / Self Care | Payer: Self-pay | Source: Ambulatory Visit | Attending: Emergency Medicine | Admitting: Emergency Medicine

## 2011-09-26 ENCOUNTER — Emergency Department (HOSPITAL_COMMUNITY)
Admission: EM | Admit: 2011-09-26 | Discharge: 2011-09-26 | Disposition: A | Discharge status: Home / Self Care | Payer: Self-pay | Source: Home / Self Care | Attending: Emergency Medicine | Admitting: Emergency Medicine

## 2011-09-26 ENCOUNTER — Encounter (HOSPITAL_COMMUNITY): Payer: Self-pay | Admitting: *Deleted

## 2011-09-26 DIAGNOSIS — M549 Dorsalgia, unspecified: Secondary | ICD-10-CM

## 2011-09-26 DIAGNOSIS — M51379 Other intervertebral disc degeneration, lumbosacral region without mention of lumbar back pain or lower extremity pain: Secondary | ICD-10-CM | POA: Insufficient documentation

## 2011-09-26 DIAGNOSIS — M5137 Other intervertebral disc degeneration, lumbosacral region: Secondary | ICD-10-CM | POA: Insufficient documentation

## 2011-09-26 MED ORDER — CYCLOBENZAPRINE HCL 5 MG PO TABS: 5.0000 mg | ORAL_TABLET | Freq: Three times a day (TID) | ORAL | Status: DC | PRN

## 2011-09-26 MED ORDER — TRAMADOL HCL 50 MG PO TABS: 50.0000 mg | ORAL_TABLET | Freq: Four times a day (QID) | ORAL | Status: DC | PRN

## 2011-09-26 NOTE — ED Provider Notes (Signed)
Medical screening examination/treatment/procedure(s) were performed by non-physician practitioner and as supervising physician I was immediately available for consultation/collaboration.  Raynald Blend, MD 09/26/11 2052

## 2011-09-26 NOTE — ED Notes (Signed)
Order  forv future back  X  Ray  Put  In  By  Dr  Margot Ables           He  Advised  Pt to go to  [facility]  Radiology  For  Films

## 2011-09-26 NOTE — ED Notes (Signed)
Pt  Reports  Low  Back  Pain  -  Pt  Reports  She  Was  Treated  For  Sciatica  About 5  Months  Ago   -  She  Was  receving  PT      For  Same  Yet   Stopped    She  Reports  Increase  In pain recently  And  denys  Any  Recent  specefic injury      She  Is  Ambulatory  To  Room  And  Is  sittng  Upright on  Exam table  Speaking in  Complete  sentances          denys  Any urinary  Symptoms

## 2011-09-26 NOTE — ED Provider Notes (Addendum)
History     CSN: [id number]  Arrival date & time 09/26/11  1932   First MD Initiated Contact with Patient 09/26/11 1936      Chief Complaint  Patient presents with  . Back Pain    (Consider location/radiation/quality/duration/timing/severity/associated sxs/prior treatment) HPI  CC: Back pain  Pt presenting w/ lower back pain that started in May after lifting chairs at work. Pain originally located in lower back w/ radiation down posterior thighs now located in lower back w/ minimal radiation to hip region of thigh. Initially seen at the family medicine clinic and referred to PT. Initial improvement w/ PT and NSAID's 800mg . Pain only minimally responsive to NSAIDs and heat. No longer going to PT. No previous imaging. Pain worse w/ movement. Denies any LOC, loss of sensation or strength in LE, loss of bowel or bladder function.  Pt is on feet for 4hrs daily at work.    Past Medical History  Diagnosis Date  . Migraine   . Obesity   . Allergy   . Osgood-Schlatter/osteochondroses     History reviewed. No pertinent past surgical history.  Family History  Problem Relation Age of Onset  . Diabetes Mother   . Hypertension Mother   . Hyperlipidemia Mother   . Hypertension Brother     History  Substance Use Topics  . Smoking status: Never Smoker   . Smokeless tobacco: Never Used  . Alcohol Use: No    OB History    Grav Para Term Preterm Abortions TAB SAB Ect Mult Living                  Review of Systems Per hpi Allergies  Codeine and Penicillins  Home Medications   Current Outpatient Rx  Name Route Sig Dispense Refill  . FEXOFENADINE HCL 60 MG PO TABS Oral Take 60 mg by mouth daily.    . OMEGA-3 FATTY ACIDS 1000 MG PO CAPS Oral Take 1 capsule (1 g total) by mouth 2 (two) times daily. 60 capsule 6  . IBUPROFEN 800 MG PO TABS Oral Take 800 mg by mouth every 8 (eight) hours as needed.    Marland Kitchen LORATADINE 10 MG PO TABS Oral Take 1 tablet (10 mg total) by mouth daily. 30  tablet 6  . RIZATRIPTAN BENZOATE 10 MG PO TABS  Take 10 mg by mouth at onset of headache, may repeat in 2 hours if needed for migraine       BP 141/74  Pulse 67  Temp 98.4 F (36.9 C) (Oral)  Resp 18  SpO2 100%  LMP 09/26/2011  Physical Exam GEN: obese, pleasant MUSC: Straight leg raise normal. No pain on hip flexion, eversion, inversion, adduction of hip. No tenderness of palpation of hip. Perispinal muscle tightness and pain and pain on palpation of lumbar spinus processes.  Ext: No edema, well perfused ED Course  Procedures (including critical care time)  Labs Reviewed - No data to display No results found.   No diagnosis found.    MDM  Lower back pain w/ radiculopathy likely secondary to muscle strain w/ likely possible lumbosacral vertebral injury NSAIDs 800mg  prior to appt today so no Toradol Future order for Lumbarsacral x-ray Flexeril and Tramadol until able to meet w/ PCP Handout given Note forwarded to PCP         Ozella Rocks, MD 09/26/11 2042  Ozella Rocks, MD 09/26/11 2055

## 2011-09-28 ENCOUNTER — Telehealth: Payer: Self-pay | Admitting: Family Medicine

## 2011-09-28 NOTE — ED Provider Notes (Signed)
Medical screening examination/treatment/procedure(s) were performed by non-physician practitioner and as supervising physician I was immediately available for consultation/collaboration.  Terez Montee   Niclas Markell, MD 09/28/11 1217 

## 2011-09-28 NOTE — Telephone Encounter (Signed)
Had xrays at ED this week and they told her to call here to get the results

## 2011-10-01 NOTE — Telephone Encounter (Signed)
Pt walked into clinic requesting x-ray results. I told the pt, there are no concerns that the x-ray would show. But, to schedule OV with new PCP. Pt agreed. Lorenda Hatchet, Renato Battles

## 2011-10-02 ENCOUNTER — Ambulatory Visit: Payer: Self-pay | Admitting: Family Medicine

## 2011-10-03 ENCOUNTER — Encounter: Payer: Self-pay | Admitting: Family Medicine

## 2011-10-03 ENCOUNTER — Ambulatory Visit (INDEPENDENT_AMBULATORY_CARE_PROVIDER_SITE_OTHER): Payer: Self-pay | Admitting: Family Medicine

## 2011-10-03 VITALS — BP 133/71 | HR 80 | Temp 98.8°F | Ht 68.5 in | Wt 217.0 lb

## 2011-10-03 DIAGNOSIS — E669 Obesity, unspecified: Secondary | ICD-10-CM

## 2011-10-03 DIAGNOSIS — M549 Dorsalgia, unspecified: Secondary | ICD-10-CM

## 2011-10-03 MED ORDER — IBUPROFEN 800 MG PO TABS: 800.0000 mg | ORAL_TABLET | Freq: Three times a day (TID) | ORAL | Status: DC | PRN

## 2011-10-03 NOTE — Assessment & Plan Note (Signed)
Patient says she has not accepted that she is obese.  We had a long discussion about normal weight for her height, and how being overweight contributes to back pain.  She understands that weight loss would help her back pain as well as her overall health.  She will increase exercise and try to make dietary changes.

## 2011-10-03 NOTE — Patient Instructions (Signed)
I am sorry your back is hurting.  Your X-rays show that you have some mild arthritis in your back, which is likely causing your low back muscles to spasm and have pain.  This will likely flair up from time to time.  Physical therapy can help with this, I have attached a sheet with exercises and will refer you to PT.  You can take ibuprofen 800 mg by mouth three times a day with food for a week, then use it as needed.   Weight loss can also help with back pain, please try to increase your exercise, a good goal is 30 minutes 5 days a week.  To help you work on improving your nutrition, remember the plate rule for each meal:  - 1/4 of the plate or less should be a whole grain starch (brown rice, whole grain pasta, wheat bread, etc.)  - 1/4 of the plate should be a lean source of protein (chicken, [place], fish, beans, egg whites).  - 1/2 the plate or more should be fruits and vegetables - the more vegetables the better!

## 2011-10-03 NOTE — Telephone Encounter (Signed)
Forward to PCP, patient wanting to speak to her Dr about x-ray results, Renato Battles already spoke with patient in office but she wants to speak with her Dr.Busick, Rodena Medin

## 2011-10-03 NOTE — Telephone Encounter (Signed)
Pt is asking to speak with doctor about her xray results - she has an appt on Monday but wants to speak with doctor before then,

## 2011-10-03 NOTE — Progress Notes (Signed)
  Subjective:    Patient ID: Rachel Cortez, female    DOB: [birth year], 46 y.o.   MRN: [id number]  HPI  [name] comes in for back pain.  No acute injury. She has had back pain on and off for several years.  She recently went to urgent care for the pain, and they gave her flexeril and a pain medication, which helped the pain but made her drowsy.  She had x-rays done at that visit, which were read as degenerative changes- and she wants to know what that means and what is going on with her back.  The pain is improved a little since the urgent care visit.  She denies weakness, numbness, tingling, incontinence.  She has had sciatica pain in the past, but that is not bothering her right now.  When she had that pain, she went to PT for a while, which helped some.    X-rays from 9/18 reviewed: Degenerative disc space in L-spine and associated facet Degenerative joint disease.  Past Medical History  Diagnosis Date  . Migraine   . Obesity   . Allergy   . Osgood-Schlatter/osteochondroses    Family History  Problem Relation Age of Onset  . Diabetes Mother   . Hypertension Mother   . Hyperlipidemia Mother   . Hypertension Brother    History  Substance Use Topics  . Smoking status: Never Smoker   . Smokeless tobacco: Never Used  . Alcohol Use: No   Review of Systems See HPI    Objective:   Physical Exam BP 133/71  Pulse 80  Temp 98.8 F (37.1 C) (Oral)  Ht 5' 8.5" (1.74 m)  Wt 217 lb (98.431 kg)  BMI 32.51 kg/m2  LMP 09/25/2011 General appearance: alert, cooperative and no distress Back: symmetric, no curvature. ROM normal. No CVA tenderness.  Patient has pain in her low paraspinal muscles.  Sensation, reflexes, and strength in LE normal, straight leg raise negative.  ROM is mildly decreased in all directions by pain.        Assessment & Plan:

## 2011-10-03 NOTE — Assessment & Plan Note (Signed)
Had long discussion regarding x-rays and back pain.  Advised that the read on the x-ray is fancy words for arthritis of the back. PT, home exercises, and weight loss can help.  Gave low back exercises from Sports medicine advisor.  Refilled ibuprofen 800, to take TIDWC x 1 week then prn.  Will refer back to PT, but she has already been this year, do not know if she can go back.

## 2011-10-04 ENCOUNTER — Ambulatory Visit: Payer: Self-pay | Admitting: Emergency Medicine

## 2011-10-04 NOTE — Telephone Encounter (Signed)
Pt had visit with Dr. Lula Olszewski yesterday to discuss back pain, during which xray results were discussed.

## 2011-10-08 ENCOUNTER — Ambulatory Visit: Payer: Self-pay | Admitting: Family Medicine

## 2011-10-15 ENCOUNTER — Ambulatory Visit: Payer: Self-pay | Admitting: Family Medicine

## 2011-10-31 ENCOUNTER — Telehealth: Payer: Self-pay | Admitting: Family Medicine

## 2011-10-31 DIAGNOSIS — M549 Dorsalgia, unspecified: Secondary | ICD-10-CM

## 2011-10-31 NOTE — Telephone Encounter (Signed)
Pt was supposed to be referred to PT at last visit and hasn't heard from anyone yet.  pls advise

## 2011-11-01 NOTE — Telephone Encounter (Signed)
Referral ordered for patient to go back to PT for back pain.

## 2011-11-06 ENCOUNTER — Ambulatory Visit: Payer: Self-pay | Admitting: Family Medicine

## 2011-11-08 ENCOUNTER — Ambulatory Visit: Payer: Self-pay | Attending: Family Medicine | Admitting: Physical Therapy

## 2011-11-08 DIAGNOSIS — M545 Low back pain, unspecified: Secondary | ICD-10-CM | POA: Insufficient documentation

## 2011-11-08 DIAGNOSIS — IMO0001 Reserved for inherently not codable concepts without codable children: Secondary | ICD-10-CM | POA: Insufficient documentation

## 2011-11-08 DIAGNOSIS — M2569 Stiffness of other specified joint, not elsewhere classified: Secondary | ICD-10-CM | POA: Insufficient documentation

## 2011-11-12 ENCOUNTER — Encounter: Payer: Self-pay | Admitting: Rehabilitation

## 2011-11-15 ENCOUNTER — Ambulatory Visit: Payer: No Typology Code available for payment source | Attending: Family Medicine | Admitting: Physical Therapy

## 2011-11-15 DIAGNOSIS — M2569 Stiffness of other specified joint, not elsewhere classified: Secondary | ICD-10-CM | POA: Insufficient documentation

## 2011-11-15 DIAGNOSIS — M545 Low back pain, unspecified: Secondary | ICD-10-CM | POA: Insufficient documentation

## 2011-11-15 DIAGNOSIS — IMO0001 Reserved for inherently not codable concepts without codable children: Secondary | ICD-10-CM | POA: Insufficient documentation

## 2011-11-20 ENCOUNTER — Encounter: Payer: Self-pay | Admitting: Rehabilitation

## 2011-11-22 ENCOUNTER — Ambulatory Visit: Payer: No Typology Code available for payment source | Admitting: Rehabilitation

## 2011-11-25 ENCOUNTER — Emergency Department (HOSPITAL_COMMUNITY)
Admission: EM | Admit: 2011-11-25 | Discharge: 2011-11-25 | Disposition: A | Discharge status: Home / Self Care | Payer: No Typology Code available for payment source | Attending: Emergency Medicine | Admitting: Emergency Medicine

## 2011-11-25 ENCOUNTER — Encounter (HOSPITAL_COMMUNITY): Payer: Self-pay | Admitting: *Deleted

## 2011-11-25 DIAGNOSIS — W260XXA Contact with knife, initial encounter: Secondary | ICD-10-CM | POA: Insufficient documentation

## 2011-11-25 DIAGNOSIS — S61219A Laceration without foreign body of unspecified finger without damage to nail, initial encounter: Secondary | ICD-10-CM

## 2011-11-25 DIAGNOSIS — Y929 Unspecified place or not applicable: Secondary | ICD-10-CM | POA: Insufficient documentation

## 2011-11-25 DIAGNOSIS — S61209A Unspecified open wound of unspecified finger without damage to nail, initial encounter: Secondary | ICD-10-CM | POA: Insufficient documentation

## 2011-11-25 DIAGNOSIS — Z8669 Personal history of other diseases of the nervous system and sense organs: Secondary | ICD-10-CM | POA: Insufficient documentation

## 2011-11-25 DIAGNOSIS — Y9389 Activity, other specified: Secondary | ICD-10-CM | POA: Insufficient documentation

## 2011-11-25 DIAGNOSIS — Z23 Encounter for immunization: Secondary | ICD-10-CM | POA: Insufficient documentation

## 2011-11-25 DIAGNOSIS — Z8739 Personal history of other diseases of the musculoskeletal system and connective tissue: Secondary | ICD-10-CM | POA: Insufficient documentation

## 2011-11-25 DIAGNOSIS — E669 Obesity, unspecified: Secondary | ICD-10-CM | POA: Insufficient documentation

## 2011-11-25 MED ORDER — TETANUS-DIPHTH-ACELL PERTUSSIS 5-2.5-18.5 LF-MCG/0.5 IM SUSP
0.5000 mL | Freq: Once | INTRAMUSCULAR | Status: AC
  Administered 2011-11-25: 0.5 mL via INTRAMUSCULAR
  Filled 2011-11-25: qty 0.5

## 2011-11-25 NOTE — ED Notes (Signed)
Pt given supplies for home .

## 2011-11-25 NOTE — ED Provider Notes (Signed)
History     CSN: [id number]  Arrival date & time 11/25/11  2058   First MD Initiated Contact with Patient 11/25/11 2208      Chief Complaint  Patient presents with  . Extremity Laceration    (Consider location/radiation/quality/duration/timing/severity/associated sxs/prior treatment) HPI History provided by pt.   Pt was opening a bag of tilapia w/ paring knife, knife slipped and she sustained a laceration to palmar surface of distal left index finger.  Cleaned w/ peroxide.  Bleeding has stopped and pain is minimal unless palpated.  Had numbness distally initially but feeling returning.  Last tetanus unknown.  Past Medical History  Diagnosis Date  . Migraine   . Obesity   . Allergy   . Osgood-Schlatter/osteochondroses     History reviewed. No pertinent past surgical history.  Family History  Problem Relation Age of Onset  . Diabetes Mother   . Hypertension Mother   . Hyperlipidemia Mother   . Hypertension Brother     History  Substance Use Topics  . Smoking status: Never Smoker   . Smokeless tobacco: Never Used  . Alcohol Use: No    OB History    Grav Para Term Preterm Abortions TAB SAB Ect Mult Living                  Review of Systems  All other systems reviewed and are negative.    Allergies  Codeine; Crab; and Penicillins  Home Medications   Current Outpatient Rx  Name  Route  Sig  Dispense  Refill  . IBUPROFEN 800 MG PO TABS   Oral   Take 1 tablet (800 mg total) by mouth every 8 (eight) hours as needed.   45 tablet   0     BP 137/74  Pulse 78  Temp 98.4 F (36.9 C) (Oral)  Resp 20  SpO2 98%  LMP 11/15/2011  Physical Exam  Nursing note and vitals reviewed. Constitutional: She is oriented to person, place, and time. She appears well-developed and well-nourished. No distress.  HENT:  Head: Normocephalic and atraumatic.  Eyes:       Normal appearance  Neck: Normal range of motion.  Pulmonary/Chest: Effort normal.  Musculoskeletal:  Normal range of motion.       Left index finger w/ flap laceration of dime-sized piece of skin on palmar surface of distal phalanx of left index finger.  Hemostatic and clean.  Ttp.  Distal sensation intact.   Neurological: She is alert and oriented to person, place, and time.  Psychiatric: She has a normal mood and affect. Her behavior is normal.    ED Course  Procedures (including critical care time)  Labs Reviewed - No data to display No results found.   1. Laceration of finger       MDM  46yo F presents w/ flap laceration of tip of left palmar index finger.  Lac cleaned by nursing staff and bulky dressing applied.  Tetanus updated.  Signs of infection discussed.         Arie Sabina Partridge, [place] 11/25/11 [phone]

## 2011-11-25 NOTE — ED Notes (Signed)
Here for L index finger tip lac, ~ 0.5cm, cut with pairing knife while cutting bag of fish, occurred around 1915. Needs Td. Bleeding conrtolled.

## 2011-11-25 NOTE — ED Provider Notes (Signed)
Medical screening examination/treatment/procedure(s) were performed by non-physician practitioner and as supervising physician I was immediately available for consultation/collaboration.   Norvin Ohlin Y. Javier Mamone, MD 11/25/11 2318 

## 2011-11-28 ENCOUNTER — Encounter: Payer: No Typology Code available for payment source | Admitting: Rehabilitation

## 2011-11-30 ENCOUNTER — Encounter: Payer: No Typology Code available for payment source | Admitting: Rehabilitation

## 2012-04-14 ENCOUNTER — Encounter: Payer: Self-pay | Admitting: Family Medicine

## 2012-04-14 ENCOUNTER — Ambulatory Visit (INDEPENDENT_AMBULATORY_CARE_PROVIDER_SITE_OTHER): Payer: Self-pay | Admitting: Family Medicine

## 2012-04-14 VITALS — BP 124/80 | HR 71 | Ht 68.5 in | Wt 221.0 lb

## 2012-04-14 DIAGNOSIS — Z0000 Encounter for general adult medical examination without abnormal findings: Secondary | ICD-10-CM

## 2012-04-14 DIAGNOSIS — E7800 Pure hypercholesterolemia, unspecified: Secondary | ICD-10-CM

## 2012-04-14 DIAGNOSIS — R011 Cardiac murmur, unspecified: Secondary | ICD-10-CM

## 2012-04-14 NOTE — Patient Instructions (Addendum)
It was nice to meet you today!  I recommend you get a mammogram - see the handout we gave you on how to schedule it. Come back in July for a lab appointment to get your cholesterol checked. You'll be due for a pap smear in May 2015.  I can't make any recommendations for or against the estroven, since it's not a pill I'm familiar with.  Your heart murmur is most likely not a problem. If you have any shortness of breath, chest pain, or any other new symptoms, come back to the clinic to be seen.  Be well, Dr. Pollie Meyer

## 2012-04-14 NOTE — Assessment & Plan Note (Signed)
Never been on medications for cholesterol. Last had lipids checked in June 2013. Pt will return in July for fasting lipid panel.

## 2012-04-14 NOTE — Assessment & Plan Note (Signed)
Pap not due for another year (May 2015) Given handout on how to schedule mammogram (has been two years since she had one) Form completed for foster parenting; will scan copy into chart

## 2012-04-14 NOTE — Progress Notes (Signed)
Name: Rachel Cortez Age/Sex: 46 y.o. female  HPI:  Rachel Cortez presents today for a well woman exam. She has been feeling well and has no complaints today.  Well woman history: Her periods have been coming regularly every month but are not always the same length. Thinks she may be going through menopause. Has been having hot flashes for years. No night sweats. Is taking estroven, an over the counter supplement. Denies vaginal discharge or pain in her abdomen/pelvis. No concern for STDs.  Hyperlipidemia: was told in the past she has high cholesterol but she never took medicine for it. She lost weight and it reportedly got better.  ROS: See HPI. Denies vaginal discharge, shortness of breath, or chest pain. Complete ROS sheet entirely negative (did not check anything off).  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 Is married to an older husband, feels safe in her relationship Has never smoked cigarettes Does not drink alcohol or do any drugs No concerns about mood or depression Rides her bike daily and has been working on walking Knows she eats sweets too much but has been working on weight  PHYSICAL EXAM: BP 124/80  Pulse 71  Ht 5' 8.5" (1.74 m)  Wt 221 lb (100.245 kg)  BMI 33.11 kg/m2  LMP 04/13/2012 Gen: NAD, pleasant, cooperative HEENT: no cervical LAD Heart: RRR, 1-2/6 early systolic murmur Lungs: CTAB, NWOB Abd: soft, nontender, nondistended Ext: no appreciable edema in bilateral lower extremities Neuro: nonfocal, speech intact

## 2012-04-14 NOTE — Assessment & Plan Note (Signed)
Auscultated on exam today. Patient has never been told that she has a heart murmur before and has never had an echo. Denies chest pain, SOB, or swelling in her legs. Murmur sounds innocent (early systolic, II/VI) and since patient is asymptomatic, will delay workup at this time. Pt also examined by Dr. Gwendolyn Grant who agrees with this plan.

## 2012-05-05 ENCOUNTER — Ambulatory Visit (INDEPENDENT_AMBULATORY_CARE_PROVIDER_SITE_OTHER): Payer: No Typology Code available for payment source | Admitting: Family Medicine

## 2012-05-05 ENCOUNTER — Encounter: Payer: Self-pay | Admitting: Family Medicine

## 2012-05-05 VITALS — BP 127/80 | HR 70 | Ht 68.5 in | Wt 220.0 lb

## 2012-05-05 DIAGNOSIS — G5700 Lesion of sciatic nerve, unspecified lower limb: Secondary | ICD-10-CM

## 2012-05-05 MED ORDER — MELOXICAM 15 MG PO TABS: 15.0000 mg | ORAL_TABLET | Freq: Every day | ORAL | Status: DC

## 2012-05-05 NOTE — Patient Instructions (Signed)
This is something called Piriformis syndrome.    Another of the medicines is called Mobic.  Take when you need it.  Piriformis Syndrome with Rehab Piriformis syndrome is a condition the affects the nervous system in the area of the hip, and is characterized by pain and possibly a loss of feeling in the backside (posterior) thigh that may extend down the entire length of the leg. The symptoms are caused by an increase in pressure on the sciatic nerve by the piriformis muscle, which is on the back of the hip and is responsible for externally rotating the hip. The sciatic nerve and its branches connect to much of the leg. Normally the sciatic nerve runs between the piriformis muscle and other muscles. However, in certain individuals the nerve runs through the muscle, which causes an increase in pressure on the nerve and results in the symptoms of piriformis syndrome. SYMPTOMS   Pain, tingling, numbness, or burning in the back of the thigh that may also extend down the entire leg.  Occasionally, tenderness in the buttock.  Loss of function of the leg.  Pain that worsens when using the piriformis muscle (running, jumping, or stairs).  Pain that increases with prolonged sitting.  Pain that is lessened by laying flat on the back. CAUSES   Piriformis syndrome is the result of an increase in pressure placed on the sciatic nerve. Often times piriformis syndrome is an overuse injury.  Stress placed on the nerve from a sudden increase in the intensity, frequency, or duration of training.  Compensation of other extremity injuries. RISK INCREASES WITH:  Sports that involve the piriformis muscle (running, walking or jumping).  You are born with (congenital) a defect in which the sciatic nerve passes through the muscle. PREVENTION  Warm up and stretch properly before activity.  Allow for adequate recovery between workouts.  Maintain physical fitness:  Strength, flexibility, and  endurance.  Cardiovascular fitness. PROGNOSIS  If treated properly, then the symptoms of piriformis syndrome usually resolve in 2 to 6 weeks. RELATED COMPLICATIONS   Persistent and possibly permanent pain and numbness in the lower extremity.  Weakness of the extremity that may progress to disability and inability to compete. TREATMENT  The most effective treatment for piriformis syndrome is rest from any activities that aggravate the symptoms. Ice and pain medication may help reduce pain and inflammation. The use of strengthening and stretching exercises may help reduce pain with activity. These exercises may be performed at home or with a therapist. A referral to a therapist may be given for further evaluation and treatment, such as ultrasound. Corticosteroid injections may be given to reduce inflammation that is causing pressure to be placed on the sciatic nerve. If non-surgical (conservative) treatment is unsuccessful, then surgery may be recommended.  MEDICATION   If pain medication is necessary, then nonsteroidal anti-inflammatory medications, such as aspirin and ibuprofen, or other minor pain relievers, such as acetaminophen, are often recommended.  Do not take pain medication for 7 days before surgery.  Prescription pain relievers may be given if deemed necessary by your caregiver. Use only as directed and only as much as you need.  Corticosteroid injections may be given by your caregiver. These injections should be reserved for the most serious cases, because they may only be given a certain number of times. HEAT AND COLD:   Cold treatment (icing) relieves pain and reduces inflammation. Cold treatment should be applied for 10 to 15 minutes every 2 to 3 hours for inflammation and pain  and immediately after any activity that aggravates your symptoms. Use ice packs or massage the area with a piece of ice (ice massage).  Heat treatment may be used prior to performing the stretching and  strengthening activities prescribed by your caregiver, physical therapist, or athletic trainer. Use a heat pack or soak the injury in warm water. SEEK IMMEDIATE MEDICAL CARE IF:  Treatment seems to offer no benefit, or the condition worsens.  Any medications produce adverse side effects. EXERCISES RANGE OF MOTION (ROM) AND STRETCHING EXERCISES - Piriformis Syndrome These exercises may help you when beginning to rehabilitate your injury. Your symptoms may resolve with or without further involvement from your physician, physical therapist or athletic trainer. While completing these exercises, remember:   Restoring tissue flexibility helps normal motion to return to the joints. This allows healthier, less painful movement and activity.  An effective stretch should be held for at least 30 seconds.  A stretch should never be painful. You should only feel a gentle lengthening or release in the stretched tissue. STRETCH - Hip Rotators  Lie on your back on a firm surface. Grasp your right / left knee with your right / left hand and your ankle with your opposite hand.  Keeping your hips and shoulders firmly planted, gently pull your right / left knee and rotate your lower leg toward your opposite shoulder until you feel a stretch in your buttocks.  Hold this stretch for __________ seconds. Repeat this stretch __________ times. Complete this stretch __________ times per day. STRETCH  Iliotibial Band  On the floor or bed, lie on your side so your right / left leg is on top. Bend your knee and grab your ankle.  Slowly bring your knee back so that your thigh is in line with your trunk. Keep your heel at your buttocks and gently arch your back so your head, shoulders and hips line up.  Slowly lower your leg so that your knee approaches the floor/bed until you feel a gentle stretch on the outside of your right / left thigh. If you do not feel a stretch and your knee will not fall farther, place the  heel of your opposite foot on top of your knee and pull your thigh down farther.  Hold this stretch for __________ seconds. Repeat __________ times. Complete __________ times per day. STRENGTHENING EXERCISES - Piriformis Syndrome  These are some of the caregiver again or until your symptoms are resolved. Remember:   Strong muscles with good endurance tolerate stress better.  Do the exercises as initially prescribed by your caregiver. Progress slowly with each exercise, gradually increasing the number of repetitions and weight used under their guidance. STRENGTH - Hip Abductors, Straight Leg Raises Be aware of your form throughout the entire exercise so that you exercise the correct muscles. Sloppy form means that you are not strengthening the correct muscles.  Lie on your side so that your head, shoulders, knee and hip line up. You may bend your lower knee to help maintain your balance. Your right / left leg should be on top.  Roll your hips slightly forward, so that your hips are stacked directly over each other and your right / left knee is facing forward.  Lift your top leg up 4-6 inches, leading with your heel. Be sure that your foot does not drift forward or that your knee does not roll toward the ceiling.  Hold this position for __________ seconds. You should feel the muscles in your outer hip lifting (  you may not notice this until your leg begins to tire).  Slowly lower your leg to the starting position. Allow the muscles to fully relax before beginning the next repetition. Repeat __________ times. Complete this exercise __________ times per day.  STRENGTH - Hip Abductors, Quadriped  On a firm, lightly padded surface, position yourself on your hands and knees. Your hands should be directly below your shoulders and your knees should be directly below your hips.  Keeping your right / left knee bent, lift your leg out to the side. Keep your legs level and in line with your  shoulders.  Position yourself on your hands and knees.  Hold for __________ seconds.  Keeping your trunk steady and your hips level, slowly lower your leg to the starting position. Repeat __________ times. Complete this exercise __________ times per day.  STRENGTH - Hip Abductors, Standing  Tie one end of a rubber exercise band/tubing to a secure surface (table, pole) and tie a loop at the other end.  Place the loop around your right / left ankle. Keeping your ankle with the band directly opposite of the secured end, step away until there is tension in the tube/band.  Hold onto a chair as needed for balance.  Keeping your back upright, your shoulders over your hips, and your toes pointing forward, lift your right / left leg out to your side. Be sure to lift your leg with your hip muscles. Do not "throw" your leg or tip your body to lift your leg.  Slowly and with control, return to the starting position. Repeat exercise __________ times. Complete this exercise __________ times per day.  Document Released: 12/25/2004 Document Revised: 06/26/2011 Document Reviewed: 04/08/2008 [facility] Patient Information 2013 [place], [place].

## 2012-05-06 ENCOUNTER — Other Ambulatory Visit: Payer: Self-pay

## 2012-05-06 DIAGNOSIS — G5700 Lesion of sciatic nerve, unspecified lower limb: Secondary | ICD-10-CM | POA: Insufficient documentation

## 2012-05-06 DIAGNOSIS — Z1231 Encounter for screening mammogram for malignant neoplasm of breast: Secondary | ICD-10-CM

## 2012-05-06 NOTE — Assessment & Plan Note (Signed)
Worse on Right side but present BL.   Provided her with exercises to help relieve this. Also provided her with Mobic as she was concerned about taking Ibuprofen. After discussing exercises, she stated that she used to ride her stationary bicycle 10 miles a day but has not done this since initiation of back pain and she was worried she was "makes him worse.". She'd like to get back to that level of fitness. We did discuss that she would need to start out slowly work her way up to 10 miles a day but this would likely help her energy, mood, neck pain more than anything else I could do for her. She seems enthused about the opportunity to start riding her bicycle again.

## 2012-05-06 NOTE — Progress Notes (Signed)
Subjective:    Rachel Cortez is a 47 y.o. female who presents to [facility] today with complaints of back pain:  1.  Back pain:  This is been having trouble with back pain since September of 2013. She states she never had trouble of back pain prior to that point. She denies any shooting setting triggers. However she noted increased pain bilateral SI joint distribution. She presents emergency department and was diagnosed with osteoarthritis/lumbar strain. She is continuing to have pain intermittently since that time. She states this pain most days. Denies any radicular symptoms.  When she presented to urgent care back in September of 2013 she had lumbar x-rays that time which were negative except for mild osteoarthritis. She presented here to clinic and discussed osteoarthritis at office visit appointment. She is been doing exercises to help with this. However she is concerned because the pain continues. She describes this as an ache 4-5/10 in severity it is better when she changes positions (goes from sitting to standing or vice versa). Also worse when prolonged walking. She states this is slightly different as she feels is more her buttocks. Radiates down back of her leg to mid thighs bilaterally. She has had her husband massaged this area which usually relieves the pain. He states he felt "knots" under under his thumbs when performing massage on her buttocks.  No fevers or chills. No further trauma or injuries since initiation of pain. No bladder or bowel incontinence. No saddle anesthesia. No lower extremity weakness. She has been taking ibuprofen for this but is concerned about taking too much medication. She takes this 2 to 3 times a day when the pain is bad but on days when she has only mild or no pain she is not take it at all.  The following portions of the patient's history were reviewed and updated as appropriate: allergies, current medications, past medical history, family and social history, and  problem list. Patient is a nonsmoker.    PMH reviewed.  Past Medical History  Diagnosis Date  . Migraine   . Obesity   . Allergy   . Osgood-Schlatter/osteochondroses    No past surgical history on file.  Medications reviewed. Current Outpatient Prescriptions  Medication Sig Dispense Refill  . ibuprofen (ADVIL,MOTRIN) 800 MG tablet Take 1 tablet (800 mg total) by mouth every 8 (eight) hours as needed.  45 tablet  0  . meloxicam (MOBIC) 15 MG tablet Take 1 tablet (15 mg total) by mouth daily.  30 tablet  1  . Nutritional Supplements (ESTROVEN MOOD & MEMORY) TABS Take 1 tablet by mouth daily.       No current facility-administered medications for this visit.    ROS as above otherwise neg.  No chest pain, palpitations, SOB, Fever, Chills, Abd pain, N/V/D.   Objective:   Physical Exam BP 127/80  Pulse 70  Ht 5' 8.5" (1.74 m)  Wt 220 lb (99.791 kg)  BMI 32.96 kg/m2  LMP 04/13/2012 Gen:  Alert, cooperative patient who appears stated age in no acute distress.  Vital signs reviewed. HEENT: EOMI,  MMM Cardiac:  Regular rate and rhythm without murmur auscultated.  Good S1/S2. Pulm:  Clear to auscultation bilaterally with good air movement.  No wheezes or rales noted.   Back:  Normal skin, Spine with normal alignment and no deformity.  No tenderness to vertebral process palpation.  Paraspinous muscles are minimally tender and without spasm BL lumbar region.  Tender somewhat over SI joints BL.  Range of  motion is full at neck and lumbar sacral regions.  Straight leg raise is negative BL for back pain.  Hip adductor strength 4/5 but easily exhausted and reproduces pain in her buttocks.   Neuro:  Sensation and motor function 5/5 bilateral lower extremities.  Patellar and Achilles  DTR's +2 patellar BL Exts: Non edematous BL  LE, warm and well perfused.   No results found for this or any previous visit (from the past 72 hour(s)).

## 2012-05-14 ENCOUNTER — Ambulatory Visit: Payer: No Typology Code available for payment source

## 2012-05-22 ENCOUNTER — Ambulatory Visit
Admission: RE | Admit: 2012-05-22 | Discharge: 2012-05-22 | Disposition: A | Discharge status: Home / Self Care | Payer: No Typology Code available for payment source | Source: Ambulatory Visit

## 2012-05-22 DIAGNOSIS — Z1231 Encounter for screening mammogram for malignant neoplasm of breast: Secondary | ICD-10-CM

## 2012-06-04 ENCOUNTER — Ambulatory Visit: Payer: No Typology Code available for payment source | Admitting: Family Medicine

## 2012-07-04 ENCOUNTER — Telehealth: Payer: Self-pay | Admitting: Family Medicine

## 2012-07-04 NOTE — Telephone Encounter (Signed)
Patient is calling wanting to have a copy of her last physical for her new job and also when was her last TB shot done also for her new job. JW

## 2012-07-07 ENCOUNTER — Ambulatory Visit (INDEPENDENT_AMBULATORY_CARE_PROVIDER_SITE_OTHER): Payer: No Typology Code available for payment source | Admitting: *Deleted

## 2012-07-07 DIAGNOSIS — Z111 Encounter for screening for respiratory tuberculosis: Secondary | ICD-10-CM

## 2012-07-07 NOTE — Progress Notes (Signed)
PPD Placement note Rachel Cortez, 47 y.o. female is here today for placement of PPD test Reason for PPD test: work Pt taken PPD test before: yes Verified in allergy area and with patient that they are not allergic to the products PPD is made of (Phenol or Tween). Yes Is patient taking any oral or IV steroid medication now or have they taken it in the last month? no Has the patient ever received the BCG vaccine?: yes Has the patient been in recent contact with anyone known or suspected of having active TB disease?: no       O: Alert and oriented in NAD. P:  PPD placed on 07/07/2012.  Patient advised to return for reading within 48-72 hours. Wyatt Haste, RN-BSN

## 2012-07-07 NOTE — Telephone Encounter (Signed)
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  Also, told pt I don't see a recent TB skin test.  She will schedule one for any day except Thursday.  Radene Ou, [place] '

## 2012-07-09 ENCOUNTER — Encounter: Payer: Self-pay | Admitting: *Deleted

## 2012-07-09 ENCOUNTER — Ambulatory Visit (INDEPENDENT_AMBULATORY_CARE_PROVIDER_SITE_OTHER): Payer: No Typology Code available for payment source | Admitting: *Deleted

## 2012-07-09 DIAGNOSIS — Z09 Encounter for follow-up examination after completed treatment for conditions other than malignant neoplasm: Secondary | ICD-10-CM

## 2012-07-09 DIAGNOSIS — Z111 Encounter for screening for respiratory tuberculosis: Secondary | ICD-10-CM

## 2012-07-09 LAB — TB SKIN TEST
Induration: 0 mm
TB Skin Test: NEGATIVE

## 2012-07-09 NOTE — Progress Notes (Signed)
PPD Reading Note PPD read and results entered in EpicCare. Result: 0 mm induration. Interpretation: negative  Pt given letter regarding results. Wyatt Haste, RN-BSN

## 2012-07-11 ENCOUNTER — Emergency Department (INDEPENDENT_AMBULATORY_CARE_PROVIDER_SITE_OTHER)
Admission: EM | Admit: 2012-07-11 | Discharge: 2012-07-11 | Disposition: A | Discharge status: Home / Self Care | Payer: No Typology Code available for payment source | Source: Home / Self Care

## 2012-07-11 ENCOUNTER — Encounter (HOSPITAL_COMMUNITY): Payer: Self-pay

## 2012-07-11 DIAGNOSIS — L255 Unspecified contact dermatitis due to plants, except food: Secondary | ICD-10-CM

## 2012-07-11 MED ORDER — METHYLPREDNISOLONE 4 MG PO KIT: PACK | ORAL | Status: DC

## 2012-07-11 MED ORDER — TRIAMCINOLONE ACETONIDE 40 MG/ML IJ SUSP
INTRAMUSCULAR | Status: AC
  Filled 2012-07-11: qty 5

## 2012-07-11 MED ORDER — TRIAMCINOLONE ACETONIDE 40 MG/ML IJ SUSP
40.0000 mg | Freq: Once | INTRAMUSCULAR | Status: AC
  Administered 2012-07-11: 40 mg via INTRAMUSCULAR

## 2012-07-11 MED ORDER — TRIAMCINOLONE ACETONIDE 0.1 % EX CREA: TOPICAL_CREAM | Freq: Two times a day (BID) | CUTANEOUS | Status: DC

## 2012-07-11 NOTE — ED Notes (Signed)
reports her husband is broken out in rash after yard work, and ow she is starting to have same rash

## 2012-07-11 NOTE — ED Provider Notes (Signed)
History    CSN: [id number] Arrival date & time 07/11/12  0850  None    Chief Complaint  Patient presents with  . Rash   (Consider location/radiation/quality/duration/timing/severity/associated sxs/prior Treatment) HPI Comments: Patient states that have poison ivy. Proximally 5 days ago she developed a red maculopapular , patchy pruritic areas to her upper extremities which have spread to her mid chest, beneath the right breast, around the neck and in her thighs. She states her husband was exposed to a "heavy dose" of poison ivy vine spray earlier in the week. She believes that he may have been the source of her rash. She has been using calamine and Benadryl topical medication. She also takes 1 Allegra a day. The rash has not been responding well to the OTC medication. She denies problems with breathing, swelling, coughing, shortness of breath or choking.  Past Medical History  Diagnosis Date  . Migraine   . Obesity   . Allergy   . Osgood-Schlatter/osteochondroses    History reviewed. No pertinent past surgical history. Family History  Problem Relation Age of Onset  . Diabetes Mother   . Hypertension Mother   . Hyperlipidemia Mother   . Hypertension Brother    History  Substance Use Topics  . Smoking status: Never Smoker   . Smokeless tobacco: Never Used  . Alcohol Use: No   OB History   Grav Para Term Preterm Abortions TAB SAB Ect Mult Living                 Review of Systems  Constitutional: Negative.   HENT: Negative.   Respiratory: Negative.   Gastrointestinal: Negative.   Skin: Positive for rash.  Neurological: Negative.   Hematological: Negative for adenopathy.    Allergies  Codeine; Crab; and Penicillins  Home Medications   Current Outpatient Rx  Name  Route  Sig  Dispense  Refill  . ibuprofen (ADVIL,MOTRIN) 800 MG tablet   Oral   Take 1 tablet (800 mg total) by mouth every 8 (eight) hours as needed.   45 tablet   0   . meloxicam (MOBIC) 15 MG  tablet   Oral   Take 1 tablet (15 mg total) by mouth daily.   30 tablet   1   . methylPREDNISolone (MEDROL DOSEPAK) 4 MG tablet      follow package directions. Start 07/12/2012.   21 tablet   0   . Nutritional Supplements (ESTROVEN MOOD & MEMORY) TABS   Oral   Take 1 tablet by mouth daily.         Marland Kitchen triamcinolone cream (KENALOG) 0.1 %   Topical   Apply topically 2 (two) times daily. Apply for 2 weeks. May use on face   30 g   0    BP 120/65  Pulse 72  Temp(Src) 98.8 F (37.1 C) (Oral)  Resp 16  SpO2 100% Physical Exam  Nursing note and vitals reviewed. Constitutional: She is oriented to person, place, and time. She appears well-developed and well-nourished. No distress.  HENT:  Mouth/Throat: Oropharynx is clear and moist.  Eyes: EOM are normal.  Neck: Normal range of motion. Neck supple.  Cardiovascular: Normal rate.   Pulmonary/Chest: Effort normal.  Musculoskeletal: She exhibits no edema and no tenderness.  Neurological: She is alert and oriented to person, place, and time. She exhibits normal muscle tone.  Skin: Skin is warm and dry.  Red, pruritic, papular vesicular, rubs texture rash in patches and streaks on the upper extremities, across  the midline of the abdomen and beneath the right breast. Also a few lesions around the neck and lower extremities. No evidence of infection.  Psychiatric: She has a normal mood and affect.    ED Course  Procedures (including critical care time) Labs Reviewed - No data to display No results found. 1. Rhus dermatitis     MDM  Kenalog 40mg  IM Medrol dosepack start tomorrow Triamcinolone cream to affected areas bid Continue the Allegra daily Continue the benadryl gel  Hayden Rasmussen, NP 07/11/12 [phone]

## 2012-07-12 NOTE — ED Provider Notes (Signed)
Medical screening examination/treatment/procedure(s) were performed by non-physician practitioner and as supervising physician I was immediately available for consultation/collaboration.   MORENO-COLL,Dairon Procter; MD  Jerzy Roepke Moreno-Coll, MD 07/12/12 1525 

## 2012-07-16 ENCOUNTER — Telehealth: Payer: Self-pay | Admitting: Family Medicine

## 2012-07-16 NOTE — Telephone Encounter (Signed)
Went to urgent care on 07/11/12 and dx'd with poison ivy.  Was given steroids (injection and dosepak).  Started having some dysuria yesterday.  Drank a lot of water and "burning tapered off."  Denies any vaginal odor, itching, discharge.  Also, noticed bruise size of quarter on wrist.  Was told to stop steroid if she had any bruising.  Is due to finish steroid tomorrow.  Concerned that symptoms are due to steroid and wants to know if she should continue med.  Will route note to Dr. Pollie Meyer for advice and call patient back.  Gaylene Brooks, RN

## 2012-07-16 NOTE — Telephone Encounter (Signed)
Pt is calling because she is on methypresnisolone. She is having bruising on body, headaches, burning with urination. She is not sure if this is side effects from medication or if she has a UTI. She just wants some information on what to do.JW

## 2012-07-17 NOTE — Telephone Encounter (Signed)
Returned patient's call. No answer. Left voicemail offering her to call back and speak with emergency line physician tonight since it is past 5pm. Will attempt to call again tomorrow.

## 2012-07-18 NOTE — Telephone Encounter (Signed)
Attempted to call pt again. No answer. Did not leave VM again this time. I am post call today and thus will likely not be able to call her back this afternoon, so if she returns the call today and has additional questions please field her questions with a clinic preceptor. Latrelle Dodrill, MD

## 2012-07-22 ENCOUNTER — Encounter (HOSPITAL_COMMUNITY): Payer: Self-pay | Admitting: Emergency Medicine

## 2012-07-22 ENCOUNTER — Emergency Department (INDEPENDENT_AMBULATORY_CARE_PROVIDER_SITE_OTHER)
Admission: EM | Admit: 2012-07-22 | Discharge: 2012-07-22 | Disposition: A | Discharge status: Home / Self Care | Payer: No Typology Code available for payment source | Source: Home / Self Care

## 2012-07-22 DIAGNOSIS — R252 Cramp and spasm: Secondary | ICD-10-CM

## 2012-07-22 LAB — POCT I-STAT, CHEM 8
Creatinine, Ser: 0.9 mg/dL (ref 0.50–1.10)
HCT: 39 % (ref 36.0–46.0)
Hemoglobin: 13.3 g/dL (ref 12.0–15.0)
Potassium: 3.7 mEq/L (ref 3.5–5.1)
Sodium: 139 mEq/L (ref 135–145)
TCO2: 28 mmol/L (ref 0–100)

## 2012-07-22 NOTE — ED Provider Notes (Signed)
Medical screening examination/treatment/procedure(s) were performed by resident physician or non-physician practitioner and as supervising physician I was immediately available for consultation/collaboration.   KINDL,JAMES DOUGLAS MD.   James D Kindl, MD 07/22/12 1748 

## 2012-07-22 NOTE — ED Provider Notes (Signed)
History    CSN: [id number] Arrival date & time 07/22/12  1537  First MD Initiated Contact with Patient 07/22/12 1643     Chief Complaint  Patient presents with  . Leg Pain   (Consider location/radiation/quality/duration/timing/severity/associated sxs/prior Treatment) HPI Comments: This patient was seen by me the first week of July 2014 for rust dermatitis and was prescribed a trial of prednisone. Toward the end of the prednisone course and she developed a small bruise on her arm. It is also about the time she started developing leg cramps. Cramps are primarily to the quadriceps and calf muscles. Yesterday while walking she developed cramps in her legs. Sometimes it occurred just sitting and watching TV.  Past Medical History  Diagnosis Date  . Migraine   . Obesity   . Allergy   . Osgood-Schlatter/osteochondroses    History reviewed. No pertinent past surgical history. Family History  Problem Relation Age of Onset  . Diabetes Mother   . Hypertension Mother   . Hyperlipidemia Mother   . Hypertension Brother    History  Substance Use Topics  . Smoking status: Never Smoker   . Smokeless tobacco: Never Used  . Alcohol Use: No   OB History   Grav Para Term Preterm Abortions TAB SAB Ect Mult Living                 Review of Systems  Constitutional: Negative.   Respiratory: Negative.   Cardiovascular: Negative.   Genitourinary: Negative.   Musculoskeletal: Positive for myalgias.       Cramps as above.  Neurological: Negative.     Allergies  Codeine; Crab; and Penicillins  Home Medications   Current Outpatient Rx  Name  Route  Sig  Dispense  Refill  . ibuprofen (ADVIL,MOTRIN) 800 MG tablet   Oral   Take 1 tablet (800 mg total) by mouth every 8 (eight) hours as needed.   45 tablet   0   . meloxicam (MOBIC) 15 MG tablet   Oral   Take 1 tablet (15 mg total) by mouth daily.   30 tablet   1   . methylPREDNISolone (MEDROL DOSEPAK) 4 MG tablet      follow  package directions. Start 07/12/2012.   21 tablet   0   . Nutritional Supplements (ESTROVEN MOOD & MEMORY) TABS   Oral   Take 1 tablet by mouth daily.         Marland Kitchen triamcinolone cream (KENALOG) 0.1 %   Topical   Apply topically 2 (two) times daily. Apply for 2 weeks. May use on face   30 g   0    BP 132/69  Pulse 78  Temp(Src) 98.6 F (37 C) (Oral)  Resp 16  SpO2 100% Physical Exam  Nursing note and vitals reviewed. Constitutional: She is oriented to person, place, and time. She appears well-developed and well-nourished. No distress.  Obese  Eyes: EOM are normal.  Neck: Normal range of motion. Neck supple.  Cardiovascular: Normal rate and normal heart sounds.   Pulmonary/Chest: Effort normal. No respiratory distress.  Musculoskeletal: She exhibits tenderness. She exhibits no edema.  Mild tenderness to the quadriceps and calf bilaterally. No swelling or edema. Full range of motion lower extremities. Extension and flexion of the knee are normal. Distal neurovascular motor sensory is intact.  Neurological: She is alert and oriented to person, place, and time. She exhibits normal muscle tone. Coordination normal.  Skin: Skin is warm and dry.  Psychiatric: She has a  normal mood and affect.    ED Course  Procedures (including critical care time) Labs Reviewed  POCT I-STAT, CHEM 8   Results for orders placed during the hospital encounter of 07/22/12  POCT I-STAT, CHEM 8      Result Value Range   Sodium 139  135 - 145 mEq/L   Potassium 3.7  3.5 - 5.1 mEq/L   Chloride 102  96 - 112 mEq/L   BUN 8  6 - 23 mg/dL   Creatinine, Ser 4.54  0.50 - 1.10 mg/dL   Glucose, Bld 81  70 - 99 mg/dL   Calcium, Ion 0.98  1.19 - 1.23 mmol/L   TCO2 28  0 - 100 mmol/L   Hemoglobin 13.3  12.0 - 15.0 g/dL   HCT 14.7  82.9 - 56.2 %    No results found. 1. Leg cramps     MDM  Perform stretches is demonstrated. Drink plenty of fluids stay well hydrated. Once all of the steroids her out of her  system the cramping may get better. No prolonged sitting or standing. Balance of your walking, exercising and wrist. He may need to follow up with your doctor for additional tests if the cramping persists.  Hayden Rasmussen, NP 07/22/12 1746

## 2012-07-22 NOTE — ED Notes (Signed)
Pt c/o bilateral leg cramps onset Friday.... Having at least 2 episodes of leg cramps that radiate down to her legs.... Denies inj/trauma, fevers... Has increased her potassium intake; bananas and potassium pills.

## 2012-09-05 ENCOUNTER — Telehealth: Payer: Self-pay | Admitting: Family Medicine

## 2012-09-05 DIAGNOSIS — E785 Hyperlipidemia, unspecified: Secondary | ICD-10-CM

## 2012-09-05 NOTE — Telephone Encounter (Signed)
Spoke with patient.  During OV in 4/14, pt was to come back in July for fasting lipid panel.  Pt instructed to call next week for lab appt.  Pt instructed NPO midnight before day of lab appt.  Will fwd to MD for future lab placement.  Eduar Kumpf, Darlyne Russian, CMA

## 2012-09-05 NOTE — Telephone Encounter (Signed)
Patient calls wanting the schedule appt for lab for cholesterol. Future labs not ordered.

## 2012-09-05 NOTE — Telephone Encounter (Signed)
Order entered in epic. Thanks! Levert Feinstein, MD

## 2012-09-18 ENCOUNTER — Other Ambulatory Visit: Payer: No Typology Code available for payment source

## 2012-09-18 DIAGNOSIS — E785 Hyperlipidemia, unspecified: Secondary | ICD-10-CM

## 2012-09-18 LAB — LIPID PANEL
Cholesterol: 233 mg/dL — ABNORMAL HIGH (ref 0–200)
VLDL: 16 mg/dL (ref 0–40)

## 2012-09-18 NOTE — Progress Notes (Signed)
FLP DONE TODAY Rachel Cortez 

## 2012-10-02 ENCOUNTER — Encounter: Payer: Self-pay | Admitting: Family Medicine

## 2012-10-06 ENCOUNTER — Encounter: Payer: Self-pay | Admitting: Family Medicine

## 2012-10-06 ENCOUNTER — Ambulatory Visit (INDEPENDENT_AMBULATORY_CARE_PROVIDER_SITE_OTHER): Payer: No Typology Code available for payment source | Admitting: Family Medicine

## 2012-10-06 VITALS — BP 142/84 | HR 61 | Temp 98.2°F | Wt 233.0 lb

## 2012-10-06 DIAGNOSIS — G43909 Migraine, unspecified, not intractable, without status migrainosus: Secondary | ICD-10-CM

## 2012-10-06 DIAGNOSIS — M549 Dorsalgia, unspecified: Secondary | ICD-10-CM

## 2012-10-06 MED ORDER — DEXAMETHASONE SODIUM PHOSPHATE 10 MG/ML IJ SOLN
10.0000 mg | Freq: Once | INTRAMUSCULAR | Status: AC
  Administered 2012-10-06: 10 mg via INTRAMUSCULAR

## 2012-10-06 MED ORDER — KETOROLAC TROMETHAMINE 60 MG/2ML IM SOLN
60.0000 mg | Freq: Once | INTRAMUSCULAR | Status: AC
  Administered 2012-10-06: 60 mg via INTRAMUSCULAR

## 2012-10-06 MED ORDER — CYCLOBENZAPRINE HCL 5 MG PO TABS: 5.0000 mg | ORAL_TABLET | Freq: Three times a day (TID) | ORAL | Status: DC | PRN

## 2012-10-06 MED ORDER — RIZATRIPTAN BENZOATE 10 MG PO TABS: 10.0000 mg | ORAL_TABLET | ORAL | Status: DC | PRN

## 2012-10-06 NOTE — Assessment & Plan Note (Signed)
Chronic condition for pt Perispinal tenderness.  No deficits Start flexeril, cont ibuprofen starting tomorrow.  Daily exercises/stretching F/u PCP

## 2012-10-06 NOTE — Addendum Note (Signed)
Addended by: Henri Medal on: 10/06/2012 11:50 AM   Modules accepted: Orders

## 2012-10-06 NOTE — Progress Notes (Signed)
Rachel Cortez is a 47 y.o. female who presents to [facility] today for SD appt for migraines.  HA: 3x wkly for past month. Maxalt works but unable to take w/ work as makes her sleepy. Ibuprofen 800mg  w/o benefit. Photophobia, phonophobia, some nausea. R sided behind R eye. Described as throbbing, achy, and occasionally stabbing. Not sure what triggers. Coffee - 1 cup per day, and previously drinking 4+ coke sodas per day. Cut down to only coffee and water over the past 2 wks.   Back pain: lower back. Radiates up to mid back. Difficult to get out of bed. Stiff feeling. Worse at night. Ibuprofen 800 w/o benefit. Denies any change in sensation in LE, loss of bowel or bladder function or muscle function.   The following portions of the patient's history were reviewed and updated as appropriate: allergies, current medications, past medical history, family and social history, and problem list.  Patient is a nonsmoker.   Past Medical History  Diagnosis Date  . Migraine   . Obesity   . Allergy   . Osgood-Schlatter/osteochondroses     ROS as above otherwise neg.    Medications reviewed. Current Outpatient Prescriptions  Medication Sig Dispense Refill  . ibuprofen (ADVIL,MOTRIN) 800 MG tablet Take 1 tablet (800 mg total) by mouth every 8 (eight) hours as needed.  45 tablet  0  . meloxicam (MOBIC) 15 MG tablet Take 1 tablet (15 mg total) by mouth daily.  30 tablet  1  . methylPREDNISolone (MEDROL DOSEPAK) 4 MG tablet follow package directions. Start 07/12/2012.  21 tablet  0  . Nutritional Supplements (ESTROVEN MOOD & MEMORY) TABS Take 1 tablet by mouth daily.      Marland Kitchen triamcinolone cream (KENALOG) 0.1 % Apply topically 2 (two) times daily. Apply for 2 weeks. May use on face  30 g  0   No current facility-administered medications for this visit.    Exam: BP 142/84  Pulse 61  Temp(Src) 98.2 F (36.8 C) (Oral)  Wt 233 lb (105.688 kg)  BMI 34.91 kg/m2  LMP 09/23/2012 Gen: Well NAD HEENT: EOMI,   MMM, fundoscopic exam nml.  Lungs: CTABL Nl WOB Heart: RRR no MRG Abd: NABS, NT, ND Exts: Non edematous BL  LE, warm and well perfused.  Neuro: CN 2-12 intact, gait nml.  MSK: lumbar perispinal tenderness bilat.   No results found for this or any previous visit (from the past 72 hour(s)).

## 2012-10-06 NOTE — Patient Instructions (Signed)
You are heaving recurrent migraines. The medicine we gave you today should break the cycle Please do not take any meloxicam or ibuprofen today Please take the maxalt as needed Please keep a detailed journal of your headache symptoms Please take flexeril for your back pain  Migraine Headache A migraine headache is an intense, throbbing pain on one or both sides of your head. A migraine can last for 30 minutes to several hours. CAUSES  The exact cause of a migraine headache is not always known. However, a migraine may be caused when nerves in the brain become irritated and release chemicals that cause inflammation. This causes pain. SYMPTOMS  Pain on one or both sides of your head.  Pulsating or throbbing pain.  Severe pain that prevents daily activities.  Pain that is aggravated by any physical activity.  Nausea, vomiting, or both.  Dizziness.  Pain with exposure to bright lights, loud noises, or activity.  General sensitivity to bright lights, loud noises, or smells. Before you get a migraine, you may get warning signs that a migraine is coming (aura). An aura may include:  Seeing flashing lights.  Seeing bright spots, halos, or zig-zag lines.  Having tunnel vision or blurred vision.  Having feelings of numbness or tingling.  Having trouble talking.  Having muscle weakness. MIGRAINE TRIGGERS  Alcohol.  Smoking.  Stress.  Menstruation.  Aged cheeses.  Foods or drinks that contain nitrates, glutamate, aspartame, or tyramine.  Lack of sleep.  Chocolate.  Caffeine.  Hunger.  Physical exertion.  Fatigue.  Medicines used to treat chest pain (nitroglycerine), birth control pills, estrogen, and some blood pressure medicines. DIAGNOSIS  A migraine headache is often diagnosed based on:  Symptoms.  Physical examination.  A CT scan or MRI of your head. TREATMENT Medicines may be given for pain and nausea. Medicines can also be given to help prevent  recurrent migraines.  HOME CARE INSTRUCTIONS  Only take over-the-counter or prescription medicines for pain or discomfort as directed by your caregiver. The use of long-term narcotics is not recommended.  Lie down in a dark, quiet room when you have a migraine.  Keep a journal to find out what may trigger your migraine headaches. For example, write down:  What you eat and drink.  How much sleep you get.  Any change to your diet or medicines.  Limit alcohol consumption.  Quit smoking if you smoke.  Get 7 to 9 hours of sleep, or as recommended by your caregiver.  Limit stress.  Keep lights dim if bright lights bother you and make your migraines worse. SEEK IMMEDIATE MEDICAL CARE IF:   Your migraine becomes severe.  You have a fever.  You have a stiff neck.  You have vision loss.  You have muscular weakness or loss of muscle control.  You start losing your balance or have trouble walking.  You feel faint or pass out.  You have severe symptoms that are different from your first symptoms. MAKE SURE YOU:   Understand these instructions.  Will watch your condition.  Will get help right away if you are not doing well or get worse. Document Released: 12/25/2004 Document Revised: 03/19/2011 Document Reviewed: 12/15/2010 [facility] Patient Information 2014 [place], [place].

## 2012-10-06 NOTE — Assessment & Plan Note (Signed)
CHronic condition for pt Given Toradol and Decadron in clinic today Pt stated that maxalt makes drowsy so did not give triptan in office today No neuro deficits. F/u w/ PCP.

## 2012-10-08 ENCOUNTER — Ambulatory Visit: Payer: No Typology Code available for payment source

## 2012-10-15 ENCOUNTER — Ambulatory Visit (INDEPENDENT_AMBULATORY_CARE_PROVIDER_SITE_OTHER): Payer: No Typology Code available for payment source | Admitting: *Deleted

## 2012-10-15 DIAGNOSIS — Z23 Encounter for immunization: Secondary | ICD-10-CM

## 2012-10-30 ENCOUNTER — Ambulatory Visit: Payer: No Typology Code available for payment source

## 2012-11-12 ENCOUNTER — Ambulatory Visit (INDEPENDENT_AMBULATORY_CARE_PROVIDER_SITE_OTHER): Payer: No Typology Code available for payment source | Admitting: Family Medicine

## 2012-11-12 ENCOUNTER — Encounter: Payer: Self-pay | Admitting: Family Medicine

## 2012-11-12 VITALS — BP 119/75 | HR 73 | Ht 68.5 in

## 2012-11-12 DIAGNOSIS — M549 Dorsalgia, unspecified: Secondary | ICD-10-CM

## 2012-11-12 MED ORDER — BACLOFEN 5 MG HALF TABLET: 5.0000 mg | ORAL_TABLET | Freq: Three times a day (TID) | ORAL | Status: DC | PRN

## 2012-11-12 NOTE — Patient Instructions (Signed)
It was great to see you again today!  I sent in a medicine for your back. Do not take the flexeril anymore since it makes you too sleepy. This other one might make you sleepy as well, so use caution.  I am referring you to physical therapy for your back. You will get a phone call to schedule this appointment.   Return in 1 month to follow up on how the back pain is doing.  Be well, Dr. Pollie Meyer

## 2012-11-12 NOTE — Assessment & Plan Note (Addendum)
Continued musculoskeletal back pain. This seems very muscular in nature. Will prescribe baclofen, has a muscle relaxer with less sedating properties since she has not tolerated Flexeril. Will also refer to physical therapy for evaluation and treatment. I suspect that if she works on strengthening her back muscles, she may have some improvement. Will have her return for a followup visit in one month.

## 2012-11-12 NOTE — Addendum Note (Signed)
Addended by: Latrelle Dodrill on: 11/12/2012 07:36 PM   Modules accepted: Orders

## 2012-11-12 NOTE — Progress Notes (Signed)
Patient ID: Rachel Cortez, female   DOB: 03/29/1965, 47 y.o.   MRN: [id number]  HPI:  Back pain: Patient presents to followup on back pain. She was given a prescription for Flexeril by Dr. Konrad Dolores, which she states made her very sleepy. It did help with her pain, but it seems this was because it made her so drowsy. She is unable to take this and work at the same time. She took Mobic yesterday, which did not help. Her pain is worse with prolonged standing, and also when she is getting out of the bed in the morning. Alleviating factors include taking the Flexeril because it drugs her up, and standing under hot water in the shower. She has to stand at work. She's had some left leg pain as well, and yesterday that her leg was weak from the pain. She denies any bowel or bladder dysfunction, fevers, or crutch numbness.  ROS: See HPI  PMFSH: History of obesity  PHYSICAL EXAM: BP 119/75  Pulse 73  Ht 5' 8.5" (1.74 m)  LMP 11/07/2012 Gen: No acute distress HEENT: Normocephalic, atraumatic Neuro: Grossly nonfocal, speech intact, full-strength in bilateral lower extremities with hip flexion, hip abduction, knee flexion, and ankle plantar flexion and dorsiflexion. Gait normal. No tenderness over spinous processes. Paraspinal musculature is tender to palpation in the lumbar and sacral areas. Full-strength of ligamentum flavum bilaterally.  ASSESSMENT/PLAN:  # Itchy area of skin on back: Patient complained of an area on her back which has been itching for about one year. Advised that she schedule a followup appointment to discuss this in greater detail, as the area may need to be biopsied. Patient understood these recommendations.  See problem based charting for additional assessment/plan.  FOLLOW UP: F/u in one month for back pain. Schedule separate appointment to discuss itchy area on back.  [place] J. Pollie Meyer, MD [facility]

## 2012-11-20 ENCOUNTER — Encounter: Payer: Self-pay | Admitting: Family Medicine

## 2012-11-20 ENCOUNTER — Ambulatory Visit (INDEPENDENT_AMBULATORY_CARE_PROVIDER_SITE_OTHER): Payer: No Typology Code available for payment source | Admitting: Family Medicine

## 2012-11-20 VITALS — BP 126/76 | HR 79 | Ht 68.5 in | Wt 233.0 lb

## 2012-11-20 DIAGNOSIS — R0602 Shortness of breath: Secondary | ICD-10-CM | POA: Insufficient documentation

## 2012-11-20 NOTE — Patient Instructions (Signed)
[name], it was nice meeting you today.  We are unsure why you had an episode of shortness of breath this morning but at this time we do not think it is related to your heart or lungs or anything serious.  Please be aware if this happens again that you should note if you have palpitations, weird heart beating, had nothing to eat.  If any of the red flags happen below, please give [place] a call or go to an urgent care/ED.  Thanks, Dr. Arlina Robes MEDICAL CARE IF:   Your condition does not improve in the time expected.  You have a hard time doing your normal activities even with rest.  You have any side effects or problems with the medicines prescribed.  You develop any new symptoms. SEEK IMMEDIATE MEDICAL CARE IF:   Your shortness of breath gets worse.  You feel lightheaded, faint, or develop a cough not controlled with medicines.  You start coughing up blood.  You have pain with breathing.  You have chest pain or pain in your arms, shoulders, or abdomen.  You are unable to walk up stairs or exercise the way you normally do.

## 2012-11-20 NOTE — Assessment & Plan Note (Addendum)
Pt with episode of shortness of breath for 10-15 minutes when getting out of the shower this morning.  She also had some dizziness/lightheadedness at that time as well, that was relieved when she let cold air blow on her.  She has had one episode of previous circumstances around 2 weeks ago, when she attributed that to paint.  She denied any associated chest pain, radiating pain, nausea, vomiting, fever, chills, sweats, recent sick contacts, recent travel, recent surgery, or recent immobility, denies any palpations or feeling like her heart was beating "weird".  She is not on exogenous estrogen, has never had DVT/PE in the past, does not have a hypercoagulable state.  No family history of early cardiac disease or hypercoagulation.  She does admit to recent increased stress as she has been in the mist of possibly separating from her husband, but otherwise no recent increased stress.  She denies previous history of depression, substance abuse, tobacco use, alcohol use, panic or anxiety attacks, or family history of mental illness.  Unsure of etiology but pre test probability of heart or lung involvement is < 5% as her Heart score without the EKG/trop is 1, TIMI is 0, PERC of 0 and Wells of 0.  Most likely secondary to increased stress or anxiety or did have slightly elevated BP at the time to 160-170 systolic but normal BP here, as her BP may have transiently made her feel slightly this way.  Red flags discussed with her that warrant further evaluation including palpitations, chest pain, shortness of breath worse with activity, etc.  She may benefit from future stress test or cardiac profiling, as she is obese and her LDL is 184 on last check not on a statin, or repeating CMET/CBC/TSH for evaluation.  Does admit to recently taking muscle relaxers (most recent 4 days ago) but having no side effects from these since starting them about 1 week ago for back pain.

## 2012-11-20 NOTE — Progress Notes (Signed)
Rachel Cortez is a 47 y.o. female who presents today for shortness of breath this AM for 10-15 minutes.  Pt with episode of shortness of breath for 10-15 minutes when getting out of the shower this morning.  She also had some dizziness/lightheadedness at that time as well, that was relieved when she let cold air blow on her.  She has had one episode of previous circumstances around 2 weeks ago, when she attributed that to paint.  She denied any associated chest pain, radiating pain, nausea, vomiting, fever, chills, sweats, recent sick contacts, recent travel, recent surgery, or recent immobility, denies any palpations or feeling like her heart was beating "weird".  She is not on exogenous estrogen, has never had DVT/PE in the past, does not have a hypercoagulable state.  No family history of early cardiac disease or hypercoagulation.  She does admit to recent increased stress as she has been in the mist of possibly separating from her husband, but otherwise no recent increased stress.  She denies previous history of depression, substance abuse, tobacco use, alcohol use, panic or anxiety attacks, or family history of mental illness.   Past Medical History  Diagnosis Date  . Migraine   . Obesity   . Allergy   . Osgood-Schlatter/osteochondroses     History  Smoking status  . Never Smoker   Smokeless tobacco  . Never Used    Family History  Problem Relation Age of Onset  . Diabetes Mother   . Hypertension Mother   . Hyperlipidemia Mother   . Hypertension Brother     Current Outpatient Prescriptions on File Prior to Visit  Medication Sig Dispense Refill  . baclofen (LIORESAL) 5 mg TABS tablet Take 0.5 tablets (5 mg total) by mouth 3 (three) times daily as needed for muscle spasms.  30 tablet  0  . ibuprofen (ADVIL,MOTRIN) 800 MG tablet Take 1 tablet (800 mg total) by mouth every 8 (eight) hours as needed.  45 tablet  0  . meloxicam (MOBIC) 15 MG tablet Take 1 tablet (15 mg total) by  mouth daily.  30 tablet  1  . methylPREDNISolone (MEDROL DOSEPAK) 4 MG tablet follow package directions. Start 07/12/2012.  21 tablet  0  . Nutritional Supplements (ESTROVEN MOOD & MEMORY) TABS Take 1 tablet by mouth daily.      . rizatriptan (MAXALT) 10 MG tablet Take 1 tablet (10 mg total) by mouth as needed for migraine. Take 10 mg by mouth at onset of headache, may repeat in 2 hours if needed for migraine  10 tablet  0  . triamcinolone cream (KENALOG) 0.1 % Apply topically 2 (two) times daily. Apply for 2 weeks. May use on face  30 g  0   No current facility-administered medications on file prior to visit.    ROS: Per HPI.  All other systems reviewed and are negative.   Physical Exam Filed Vitals:   11/20/12 1416  BP: 126/76  Pulse: 79    Physical Examination: General appearance - alert, well appearing, and in no distress Neck - No carotid bruit, no JVD Chest - CTA B/L, no wheezing, no increased WOB, normal respiratory rate.  O2 sat of 99% on RA, no accessory muscle use  Heart - normal rate and regular rhythm, systolic murmur 1/6  Abdomen - soft, nontender, nondistended, no masses or organomegaly Neurological - motor and sensory grossly normal bilaterally Ext: No leg swelling or TTP   Lab Results  Component Value Date   HGBA1C  5.6 07/06/2011        

## 2012-11-23 ENCOUNTER — Encounter (HOSPITAL_COMMUNITY): Payer: Self-pay | Admitting: Emergency Medicine

## 2012-11-23 ENCOUNTER — Emergency Department (HOSPITAL_COMMUNITY)
Admission: EM | Admit: 2012-11-23 | Discharge: 2012-11-23 | Disposition: A | Discharge status: Home / Self Care | Payer: No Typology Code available for payment source | Source: Home / Self Care | Attending: Family Medicine | Admitting: Family Medicine

## 2012-11-23 DIAGNOSIS — Z77098 Contact with and (suspected) exposure to other hazardous, chiefly nonmedicinal, chemicals: Secondary | ICD-10-CM

## 2012-11-23 MED ORDER — CLONAZEPAM 0.5 MG PO TABS
0.5000 mg | ORAL_TABLET | Freq: Two times a day (BID) | ORAL | Status: DC | PRN
Start: 2012-11-23 — End: 2012-11-25

## 2012-11-23 NOTE — ED Provider Notes (Addendum)
CSN: [id number]     Arrival date & time 11/23/12  1129 History   None    Chief Complaint  Patient presents with  . Chemical Exposure   (Consider location/radiation/quality/duration/timing/severity/associated sxs/prior Treatment) Patient is a 47 y.o. female presenting with cough. The history is provided by the patient.  Cough Cough characteristics:  Non-productive Severity:  Mild Duration:  2 weeks Chronicity:  New Smoker: no   Context: fumes   Context comment:  From paint and bleach exposure. Associated symptoms: no shortness of breath and no wheezing     Past Medical History  Diagnosis Date  . Migraine   . Obesity   . Allergy   . Osgood-Schlatter/osteochondroses    History reviewed. No pertinent past surgical history. Family History  Problem Relation Age of Onset  . Diabetes Mother   . Hypertension Mother   . Hyperlipidemia Mother   . Hypertension Brother    History  Substance Use Topics  . Smoking status: Never Smoker   . Smokeless tobacco: Never Used  . Alcohol Use: No   OB History   Grav Para Term Preterm Abortions TAB SAB Ect Mult Living                 Review of Systems  Constitutional: Negative.   HENT: Negative.   Respiratory: Positive for cough. Negative for chest tightness, shortness of breath and wheezing.   Cardiovascular: Negative.   Psychiatric/Behavioral: The patient is nervous/anxious.     Allergies  Codeine; Crab; and Penicillins  Home Medications   Current Outpatient Rx  Name  Route  Sig  Dispense  Refill  . Nutritional Supplements (ESTROVEN MOOD & MEMORY) TABS   Oral   Take 1 tablet by mouth daily.         . baclofen (LIORESAL) 5 mg TABS tablet   Oral   Take 0.5 tablets (5 mg total) by mouth 3 (three) times daily as needed for muscle spasms.   30 tablet   0   . clonazePAM (KLONOPIN) 0.5 MG tablet   Oral   Take 1 tablet (0.5 mg total) by mouth 2 (two) times daily as needed for anxiety.   10 tablet   0   . meloxicam  (MOBIC) 15 MG tablet   Oral   Take 1 tablet (15 mg total) by mouth daily.   30 tablet   1   . methylPREDNISolone (MEDROL DOSEPAK) 4 MG tablet      follow package directions. Start 07/12/2012.   21 tablet   0   . rizatriptan (MAXALT) 10 MG tablet   Oral   Take 1 tablet (10 mg total) by mouth as needed for migraine. Take 10 mg by mouth at onset of headache, may repeat in 2 hours if needed for migraine   10 tablet   0   . triamcinolone cream (KENALOG) 0.1 %   Topical   Apply topically 2 (two) times daily. Apply for 2 weeks. May use on face   30 g   0    BP 119/84  Pulse 75  Temp(Src) 97.4 F (36.3 C) (Oral)  Resp 18  SpO2 100%  LMP 11/07/2012 Physical Exam  Nursing note and vitals reviewed. Constitutional: She is oriented to person, place, and time. She appears well-developed and well-nourished. She appears distressed.  HENT:  Mouth/Throat: Oropharynx is clear and moist.  Neck: Normal range of motion. Neck supple.  Cardiovascular: Normal rate, regular rhythm and normal heart sounds.   Pulmonary/Chest: Effort normal and breath  sounds normal. No respiratory distress. She has no wheezes. She has no rales.  Lymphadenopathy:    She has no cervical adenopathy.  Neurological: She is alert and oriented to person, place, and time.  Skin: Skin is warm and dry.    ED Course  Procedures (including critical care time) Labs Review Labs Reviewed - No data to display Imaging Review No results found.  EKG Interpretation    Date/Time:    Ventricular Rate:    PR Interval:    QRS Duration:   QT Interval:    QTC Calculation:   R Axis:     Text Interpretation:              MDM  X-rays reviewed and report per radiologist.     Linna Hoff, MD 11/23/12 1307  Linna Hoff, MD 11/24/12 2118

## 2012-11-23 NOTE — ED Notes (Signed)
Pt reports she had painters in her house and felt nauseous and like she couldn't breathe 3 days ago. Went outside to get fresh air and felt better. Also pt reports she had used a lot of bleach in her bath tub for cleaning and again felt similar symptoms of nausea, dizziness. Pt is alert and oriented and in no acute distress.

## 2012-11-24 ENCOUNTER — Telehealth: Payer: Self-pay | Admitting: Family Medicine

## 2012-11-24 ENCOUNTER — Ambulatory Visit: Payer: No Typology Code available for payment source | Admitting: Family Medicine

## 2012-11-24 NOTE — Telephone Encounter (Signed)
Pt called and would like to have blood work done, because she was exposed to bleach smells and has been feeling bad ever since. She doesn't know what is going on  And if she has diabetes, high BP or what. Myriam Jacobson

## 2012-11-24 NOTE — Telephone Encounter (Signed)
Pt called and told needs office visit before and blood work may be ordered.  Pt states she saw doctors already at ED and Urgent Care and wants Dr. Pollie Meyer to order labs for her without an office visit.  I told patient she would need an office visit, that that is our clinic policy.  Pt will call back and schedule an office visit with her PCP.  Darice Vicario, Darlyne Russian, CMA

## 2012-11-25 ENCOUNTER — Ambulatory Visit (INDEPENDENT_AMBULATORY_CARE_PROVIDER_SITE_OTHER): Payer: No Typology Code available for payment source | Admitting: Family Medicine

## 2012-11-25 ENCOUNTER — Ambulatory Visit: Payer: No Typology Code available for payment source | Admitting: Family Medicine

## 2012-11-25 ENCOUNTER — Encounter: Payer: Self-pay | Admitting: Family Medicine

## 2012-11-25 ENCOUNTER — Ambulatory Visit (HOSPITAL_COMMUNITY)
Admission: RE | Admit: 2012-11-25 | Discharge: 2012-11-25 | Disposition: A | Discharge status: Home / Self Care | Payer: No Typology Code available for payment source | Source: Ambulatory Visit | Attending: Family Medicine | Admitting: Family Medicine

## 2012-11-25 VITALS — BP 140/80 | HR 87 | Ht 68.5 in | Wt 231.5 lb

## 2012-11-25 DIAGNOSIS — R0602 Shortness of breath: Secondary | ICD-10-CM | POA: Insufficient documentation

## 2012-11-25 DIAGNOSIS — R5383 Other fatigue: Secondary | ICD-10-CM | POA: Insufficient documentation

## 2012-11-25 DIAGNOSIS — R5381 Other malaise: Secondary | ICD-10-CM | POA: Insufficient documentation

## 2012-11-25 DIAGNOSIS — R002 Palpitations: Secondary | ICD-10-CM

## 2012-11-25 LAB — CBC WITH DIFFERENTIAL/PLATELET
HCT: 38.2 % (ref 36.0–46.0)
Hemoglobin: 12.5 g/dL (ref 12.0–15.0)
Lymphocytes Relative: 19 % (ref 12–46)
Lymphs Abs: 2.2 10*3/uL (ref 0.7–4.0)
Monocytes Absolute: 0.8 10*3/uL (ref 0.1–1.0)
Monocytes Relative: 7 % (ref 3–12)
Neutro Abs: 8.9 10*3/uL — ABNORMAL HIGH (ref 1.7–7.7)
Platelets: 325 10*3/uL (ref 150–400)
RBC: 5 MIL/uL (ref 3.87–5.11)
WBC: 12 10*3/uL — ABNORMAL HIGH (ref 4.0–10.5)

## 2012-11-25 NOTE — Assessment & Plan Note (Addendum)
Patient currently asymptomatic. Suspect component of anxiety.

## 2012-11-25 NOTE — Assessment & Plan Note (Signed)
Suspect anxiety however will rule out medical cause. -check TSH, CBC, vitamin D -encouraged patient to adhear to 3 meals per day

## 2012-11-25 NOTE — Progress Notes (Signed)
  Subjective:    Patient ID: Rachel Cortez, female    DOB: 1965-11-27, 47 y.o.   MRN: [id number]  HPI 47 y/o female presents for evaluation of "shakiness" "?diabetes", she has recently been seen in the [facility] and the ER for evaluation of SOB that was related to recent bleach exposure. She also had similar symptoms during a paint exposure on 11/07/12. She describes feeling of SOB, shakiness, no chest pain, some lightheadedness, no LOC, mild associated nausea, "heart is irregular", she states that she has not had these symptoms in between these two exposures, no emesis, she reports loose stools for the past few weeks, no blood in stool, no abdominal pain, no emesis  State that her mood is good, denies sadness, denies anhedonia, denies stress in her life, she eats irregularly, usually only eats 1-2 meals per day, does not exercise regularly (previoulsy walked regularly).   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  Respiratory: Positive for shortness of breath. Negative for cough and choking.   Cardiovascular: Negative for chest pain and leg swelling.  Gastrointestinal: Negative for nausea, vomiting and diarrhea.  Endocrine: Negative for polydipsia, polyphagia and polyuria.  Musculoskeletal: Negative for arthralgias.       Objective:   Physical Exam Vitals: reviewed Gen: pleasant female, appears anxious, NAD HEENT: PERRL, EOMI, bilateral TM's pearly grey, MMM, no pharyngeal erythema or exudate, neck supple, no anterior or posterior cervical adenopathy, thyroid not enlarged, no thyroid mass Cardiac: RRR, S1 and S2 present, no murmurs, no heaves/thrills Resp: CTAB, normal effort Abd: soft, no tenderness, normal bowel sounds Ext. No edema Pshych: appropriately dressed, appears anxious however states mood as good, no flight of  ideas  Reviewed lab work completed in the past 12 months. BMP and A1C normal. Lipid profile identified elevated total cholesterol.  EKG - NSR, no ischemic changes.        Assessment & Plan:  Please see problem specific assessment and plan.

## 2012-11-25 NOTE — Patient Instructions (Signed)
Your EKG was normal today. We will check som lab work today including a TSH, CBC, and Vitamin D level. Dr. Randolm Idol will call you with these results.

## 2012-11-25 NOTE — Assessment & Plan Note (Signed)
Intermittent. Patient currently asymptomatic. EKG wnl. -check TSH -suspect component of anxiety -if persists consider additional cardiac workup.

## 2012-11-27 ENCOUNTER — Telehealth: Payer: Self-pay | Admitting: Family Medicine

## 2012-11-27 NOTE — Telephone Encounter (Signed)
Discussed lab results with patient. 

## 2012-12-07 ENCOUNTER — Emergency Department (HOSPITAL_COMMUNITY)
Admission: EM | Admit: 2012-12-07 | Discharge: 2012-12-07 | Disposition: A | Discharge status: Home / Self Care | Payer: No Typology Code available for payment source | Attending: Emergency Medicine | Admitting: Emergency Medicine

## 2012-12-07 ENCOUNTER — Emergency Department (HOSPITAL_COMMUNITY): Payer: No Typology Code available for payment source

## 2012-12-07 ENCOUNTER — Encounter (HOSPITAL_COMMUNITY): Payer: Self-pay | Admitting: Emergency Medicine

## 2012-12-07 DIAGNOSIS — Z8679 Personal history of other diseases of the circulatory system: Secondary | ICD-10-CM | POA: Insufficient documentation

## 2012-12-07 DIAGNOSIS — R259 Unspecified abnormal involuntary movements: Secondary | ICD-10-CM | POA: Insufficient documentation

## 2012-12-07 DIAGNOSIS — E669 Obesity, unspecified: Secondary | ICD-10-CM | POA: Insufficient documentation

## 2012-12-07 DIAGNOSIS — R011 Cardiac murmur, unspecified: Secondary | ICD-10-CM | POA: Insufficient documentation

## 2012-12-07 DIAGNOSIS — M62838 Other muscle spasm: Secondary | ICD-10-CM

## 2012-12-07 LAB — BASIC METABOLIC PANEL
CO2: 26 mEq/L (ref 19–32)
Calcium: 8.7 mg/dL (ref 8.4–10.5)
GFR calc Af Amer: >90 mL/min (ref >90)
Glucose, Bld: 100 mg/dL — ABNORMAL HIGH (ref 70–99)
Potassium: 3.7 mEq/L (ref 3.5–5.1)
Sodium: 138 mEq/L (ref 135–145)

## 2012-12-07 LAB — IRON AND TIBC
Iron: 31 ug/dL — ABNORMAL LOW (ref 42–135)
TIBC: 378 ug/dL (ref 250–470)
UIBC: 347 ug/dL (ref 125–400)

## 2012-12-07 LAB — CBC WITH DIFFERENTIAL/PLATELET
Eosinophils Relative: 1 % (ref 0–5)
HCT: 36.5 % (ref 36.0–46.0)
Lymphocytes Relative: 19 % (ref 12–46)
Lymphs Abs: 1.6 10*3/uL (ref 0.7–4.0)
MCV: 80.2 fL (ref 78.0–100.0)
Monocytes Absolute: 0.5 10*3/uL (ref 0.1–1.0)
Neutro Abs: 6.4 10*3/uL (ref 1.7–7.7)
RBC: 4.55 MIL/uL (ref 3.87–5.11)
WBC: 8.6 10*3/uL (ref 4.0–10.5)

## 2012-12-07 NOTE — ED Notes (Signed)
Patient discharged to home with family. NAD.  

## 2012-12-07 NOTE — ED Notes (Signed)
Pt reports having left sided neck pain which runs down her left arm. Pt reports waking up at 5:30 this morning and feeling like she "was shaking all over on the inside." Pt reports that this has been occuring since November 13th.

## 2012-12-07 NOTE — ED Provider Notes (Addendum)
CSN: [id number]     Arrival date & time 12/07/12  4098 History   First MD Initiated Contact with Patient 12/07/12 [phone]     Chief Complaint  Patient presents with  . Neck Pain   (Consider location/radiation/quality/duration/timing/severity/associated sxs/prior Treatment) HPI Comments: Secondly pt states that for the last month she has had periods of feeling shaky.  She denies fever, congestion, abd pain, N/V/D.  She developed a dry cough yesterday but no wheezing or SOB.  Pt has been around multiple sick contacts but has had a flu shot this year.  She states she can't explain it she just does not feel herself.   She feels weak and shaky in the morning and resolves after orange juice but no hx of diabetes.  Menses have not changed at this time.  No new medications.  Patient is a 47 y.o. female presenting with neck pain. The history is provided by the patient.  Neck Pain Pain location:  L side Quality:  Aching and stiffness Stiffness is present:  In the morning Pain radiates to:  L shoulder Pain severity:  Moderate Pain is:  Worse during the day Onset quality:  Gradual Duration:  3 days Timing:  Intermittent Progression:  Worsening Chronicity:  New Context: not fall, not jumping from heights, not lifting a heavy object and not recent injury   Relieved by:  None tried Worsened by:  Position Ineffective treatments:  None tried Associated symptoms: no bladder incontinence, no bowel incontinence, no chest pain, no fever, no headaches, no numbness, no syncope, no visual change, no weakness and no weight loss   Risk factors: no hx of head and neck radiation, no hx of spinal trauma and no recurrent falls     Past Medical History  Diagnosis Date  . Migraine   . Obesity   . Allergy   . Osgood-Schlatter/osteochondroses    History reviewed. No pertinent past surgical history. Family History  Problem Relation Age of Onset  . Diabetes Mother   . Hypertension Mother   . Hyperlipidemia  Mother   . Hypertension Brother    History  Substance Use Topics  . Smoking status: Never Smoker   . Smokeless tobacco: Never Used  . Alcohol Use: No   OB History   Grav Para Term Preterm Abortions TAB SAB Ect Mult Living                 Review of Systems  Constitutional: Negative for fever and weight loss.  Cardiovascular: Negative for chest pain and syncope.  Gastrointestinal: Negative for bowel incontinence.  Genitourinary: Negative for bladder incontinence.  Musculoskeletal: Positive for neck pain.  Neurological: Negative for weakness, numbness and headaches.  All other systems reviewed and are negative.    Allergies  Codeine; Crab; and Penicillins  Home Medications  No current outpatient prescriptions on file. BP 135/70  Temp(Src) 98.4 F (36.9 C) (Oral)  Resp 13  SpO2 100%  LMP 11/07/2012 Physical Exam  Nursing note and vitals reviewed. Constitutional: She is oriented to person, place, and time. She appears well-developed and well-nourished. No distress.  HENT:  Head: Normocephalic and atraumatic.  Mouth/Throat: Oropharynx is clear and moist.  Eyes: Conjunctivae and EOM are normal. Pupils are equal, round, and reactive to light.  Neck: Normal range of motion. Neck supple. Muscular tenderness present. No spinous process tenderness present. Normal range of motion present.    Cardiovascular: Normal rate, regular rhythm and intact distal pulses.   Murmur heard.  Systolic murmur is present  with a grade of 2/6  Pulmonary/Chest: Effort normal and breath sounds normal. No respiratory distress. She has no wheezes. She has no rales.  Abdominal: Soft. She exhibits no distension. There is no tenderness. There is no rebound and no guarding.  Musculoskeletal: Normal range of motion. She exhibits no edema and no tenderness.  Neurological: She is alert and oriented to person, place, and time. She has normal strength. No sensory deficit.  Skin: Skin is warm and dry. No rash  noted. No erythema.  Psychiatric: She has a normal mood and affect. Her behavior is normal.    ED Course  Procedures (including critical care time) Labs Review Labs Reviewed  CBC WITH DIFFERENTIAL - Abnormal; Notable for the following:    Hemoglobin 11.6 (*)    MCH 25.5 (*)    All other components within normal limits  BASIC METABOLIC PANEL - Abnormal; Notable for the following:    Glucose, Bld 100 (*)    GFR calc non Af Amer 82 (*)    All other components within normal limits  IRON AND TIBC   Imaging Review Dg Chest 2 View  12/07/2012   CLINICAL DATA:  Cough, shortness of breath  EXAM: CHEST  2 VIEW  COMPARISON:  None.  FINDINGS: Lungs are clear. No pleural effusion or pneumothorax.  The heart is normal in size.  Visualized osseous structures are within normal limits.  IMPRESSION: Normal chest radiographs.   Electronically Signed   By: Charline Bills M.D.   On: 12/07/2012 08:15    EKG Interpretation    Date/Time:  Sunday December 07 2012 06:35:27 EST Ventricular Rate:  80 PR Interval:  133 QRS Duration: 89 QT Interval:  389 QTC Calculation: 449 R Axis:   66 Text Interpretation:  Sinus rhythm Normal ECG No significant change since last tracing Confirmed by Shantrell Placzek  MD, Wm Fruchter ([id number]) on 12/07/2012 7:04:44 AM            MDM   1. Muscle spasms of neck     Patient presents with 2 separate complaints initially with complaints of pain in her neck is causing stiffness in her arm which is classic for muscular spasm and strain. She is neurovascularly intact and has no neurological findings or complaints. Feel the patient would benefit from a muscle relaxer and this was discussed with her.  Secondly patient has vague complaints of not feeling well for the last one month with reported episodes of feeling shaky when she wakes up in the morning. She has a dry cough that started yesterday and has been around multiple people been ill but she states she does not feel like she  has an infection. She had her doctor check vitamin D levels and thyroid last week which were normal but she is waiting to see her doctor told December 8. She states she just does not feel well and does not know why. On exam there are no focal exam findings other than a 2/6 systolic murmur but normal blood pressure pulse and oxygen saturation. Patient denies a history of diabetes, hypertension, hyperlipidemia. She currently is taking no over-the-counter medicines and has not engage in tobacco, alcohol or drug use. Will ensure normal electrolytes with a BMP and when she had a CBC checked last week her white blood cell count was elevated. Also an iron panel ordered to be followed up by her physician  9:02 AM Labs are wnl.  CXR wnl.  Will d/c pt home.  Gwyneth Sprout, MD 12/07/12 [phone]  Gwyneth Sprout, MD 12/07/12 [phone]

## 2012-12-08 ENCOUNTER — Telehealth: Payer: Self-pay | Admitting: *Deleted

## 2012-12-10 NOTE — Telephone Encounter (Signed)
If pt wants to take an iron supplement in the mean time, she can take ferrous sulfate 325mg  daily. I recommend she takes it with orange juice as this will help her absorb it better. She should keep her scheduled appointment with me so we can review the problems she has gone to the ER and [facility] for recently. Please call patient to inform.  Latrelle Dodrill, MD

## 2012-12-10 NOTE — Telephone Encounter (Signed)
Will fwd to PCP.  Danaysha Kirn L, CMA  

## 2012-12-10 NOTE — Telephone Encounter (Signed)
Pt knows her iron level is low.  She wants to know what to do--like what kind of supplement to get. Please advise

## 2012-12-11 ENCOUNTER — Encounter: Payer: Self-pay | Admitting: Family Medicine

## 2012-12-11 DIAGNOSIS — D509 Iron deficiency anemia, unspecified: Secondary | ICD-10-CM | POA: Insufficient documentation

## 2012-12-15 ENCOUNTER — Encounter: Payer: Self-pay | Admitting: Family Medicine

## 2012-12-15 ENCOUNTER — Ambulatory Visit (INDEPENDENT_AMBULATORY_CARE_PROVIDER_SITE_OTHER): Payer: No Typology Code available for payment source | Admitting: Family Medicine

## 2012-12-15 VITALS — BP 131/85 | HR 73 | Temp 98.2°F | Ht 68.5 in | Wt 232.0 lb

## 2012-12-15 DIAGNOSIS — D649 Anemia, unspecified: Secondary | ICD-10-CM

## 2012-12-15 DIAGNOSIS — N923 Ovulation bleeding: Secondary | ICD-10-CM

## 2012-12-15 DIAGNOSIS — N921 Excessive and frequent menstruation with irregular cycle: Secondary | ICD-10-CM

## 2012-12-15 DIAGNOSIS — R251 Tremor, unspecified: Secondary | ICD-10-CM

## 2012-12-15 DIAGNOSIS — R259 Unspecified abnormal involuntary movements: Secondary | ICD-10-CM

## 2012-12-15 NOTE — Progress Notes (Signed)
Patient ID: Rachel Cortez, female   DOB: 01-Jan-1966, 47 y.o.   MRN: [id number]  HPI:  Shaky feeling: Patient reports that since November she has not been feeling well. This all began when she was exposed to some paint fumes around Halloween. She felt short of breath at that time. Then another time she cleaned her bathtub with bleach, and didn't rinse it until the next day when she was taking a shower. During the shower her heart rate went up and her blood pressure was reportedly 189/93. She has been getting up and feeling shaky most days. She was seen here in our clinic and also at urgent care and in the emergency room for this. She has had multiple tests done which were normal. The ER did call her and told her that she was anemic, and that her iron labs were low. She has been taking ferrous sulfate over-the-counter since being told this, and states on the day she has taken it she felt better. She does not normally eat breakfast but when she has taken iron with orange juice she feels better. The iron has given her some dark stools, so she has backed off of taking it. She also notes in her feet stay cold. She has generally decreased energy. She denies any chest pain, recent shortness of breath, or recent palpitations. She has not seen any blood in her stool, just has been dark with iron. She has not been constipated. She has had periods twice a month for about 6 months. The ER provider told her that she could have blood work done to see if she is approaching menopause (I believe she is referring to [facility]), and she inquires about having that done today. She denies any cough or persistent night sweats. No weight loss according to flow sheet.   Ear problem: At the very end of the visit patient brought up that her ear had been hurting her yesterday. She requested that I look in her ears. I told her we did not really have time to address this issue but that I would look in her ears.  ROS: See HPI  PMFSH: hx of  muscle aches while taking estroven (a supplement), these stopped when she quit that supplement  PHYSICAL EXAM: BP 122/79  Pulse 76  Temp(Src) 98.2 F (36.8 C) (Oral)  Ht 5' 8.5" (1.74 m)  Wt 232 lb (105.235 kg)  BMI 34.76 kg/m2  LMP 12/04/2012 Gen: NAD, anxious appearing HEENT: TM's clear bilaterally, normal canals, no LAD Heart: RRR, soft 2/6 systolic murmur Lungs: CTAB, NWOB Abdomen: soft, NTTP, no masses or organomegaly Neuro: grossly nonfocal, speech intact GU: pt refused exam  ASSESSMENT/PLAN:  # Shaky feeling: unclear etiology. Orthostatics negative today. Patient was mildly anemic during most recent ER visit, with mildly low iron stores. The ER unfortunately did not check a ferritin, which is important in delineating iron deficiency anemia from anemia of chronic disease. I suspect that there is some degree of anxiety and this patient's complaints. She may also be perimenopausal, which is consistent with her history of beginning to have periods twice per month. Because she is still having periods, there is no role for [facility] testing. After discussion with Dr. Jennette Kettle, patient may benefit from a low-dose SSRI in the event that she is perimenopausal. I discussed this option with patient and she declined, stating she did not want to take that kind of medicine. In order to complete the workup for iron deficiency anemia, will check ferritin today. I  have encouraged patient to eat frequently throughout the day, as there may also be a component of relative hypoglycemia, thirst or hunger to her complaints since she feels better after drinking orange juice in the morning. I have advised that she continue taking her iron since it is making her feel better.  # Intermenstrual/irregular bleeding: I wanted to do a pelvic exam today, the patient refused this. I think she would benefit from a pelvic ultrasound to evaluate her endometrial stripe. If it is enlarged, then I will recommend that she have an  endometrial biopsy. Patient will followup for a pelvic exam and so we can order the ultrasound. Precepted with Dr. Jennette Kettle who agrees with this plan.   # Ear pain: no abnormalities noted on exam. F/u as needed. Did not fully address this today - was brought up at end of visit.  See problem based charting for additional assessment/plan.  FOLLOW UP: F/u in 3-4 weeks for pelvic exam and to order pelvic ultrasound.  [place] J. Pollie Meyer, MD [facility]

## 2012-12-15 NOTE — Patient Instructions (Addendum)
It was great to see you again today!  You can continue taking the iron with orange juice. I also recommend you eat small, regular meals. This will likely help your shakiness. I am checking the last part of your iron studies today. I will call you if your test results are not normal.  Otherwise, I will send you a letter.  If you do not hear from me with in 2 weeks please call our office.      Follow up in 3-4 weeks so we can do your pelvic exam and order the ultrasound.  Be well, Dr. Pollie Meyer    Perimenopause Perimenopause is the time when your body begins to move into the menopause (no menstrual period for 12 straight months). It is a natural process. Perimenopause can begin 2 to 8 years before the menopause and usually lasts for one year after the menopause. During this time, your ovaries may or may not produce an egg. The ovaries vary in their production of estrogen and progesterone hormones each month. This can cause irregular menstrual periods, difficulty in getting pregnant, vaginal bleeding between periods and uncomfortable symptoms. CAUSES  Irregular production of the ovarian hormones, estrogen and progesterone, and not ovulating every month.  Other causes include:  Tumor of the pituitary gland in the brain.  Medical disease that affects the ovaries.  Radiation treatment.  Chemotherapy.  Unknown causes.  Heavy smoking and excessive alcohol intake can bring on perimenopause sooner. SYMPTOMS   Hot flashes.  Night sweats.  Irregular menstrual periods.  Decrease sex drive.  Vaginal dryness.  Headaches.  Mood swings.  Depression.  Memory problems.  Irritability.  Tiredness.  Weight gain.  Trouble getting pregnant.  The beginning of losing bone cells (osteoporosis).  The beginning of hardening of the arteries (atherosclerosis). DIAGNOSIS  Your caregiver will make a diagnosis by analyzing your age, menstrual history and your symptoms. They will do a  physical exam noting any changes in your body, especially your female organs. Female hormone tests may or may not be helpful depending on the amount and when you produce the female hormones. However, other hormone tests may be helpful (ex. thyroid hormone) to rule out other problems. TREATMENT  The decision to treat during the perimenopause should be made by you and your caregiver depending on how the symptoms are affecting you and your life style. There are various treatments available such as:  Treating individual symptoms with a specific medication for that symptom (ex. tranquilizer for depression).  Herbal medications that can help specific symptoms.  Counseling.  Group therapy.  No treatment. HOME CARE INSTRUCTIONS   Before seeing your caregiver, make a list of your menstrual periods (when the occur, how heavy they are, how long between periods and how long they last), your symptoms and when they started.  Take the medication as recommended by your caregiver.  Sleep and rest.  Exercise.  Eat a diet that contains calcium (good for your bones) and soy (acts like estrogen hormone).  Do not smoke.  Avoid alcoholic beverages.  Taking vitamin E may help in certain cases.  Take calcium and vitamin D supplements to help prevent bone loss.  Group therapy is sometimes helpful.  Acupuncture may help in some cases. SEEK MEDICAL CARE IF:   You have any of the above and want to know if it is perimenopause.  You want advice and treatment for any of your symptoms mentioned above.  You need a referral to a specialist (gynecologist, psychiatrist or psychologist).  SEEK IMMEDIATE MEDICAL CARE IF:   You have vaginal bleeding.  Your period lasts longer than 8 days.  You periods are recurring sooner than 21 days.  You have bleeding after intercourse.  You have severe depression.  You have pain when you urinate.  You have severe headaches.  You develop vision  problems. Document Released: 02/02/2004 Document Revised: 03/19/2011 Document Reviewed: 07/24/2012 [facility] Patient Information 2014 [place], [place].

## 2012-12-16 ENCOUNTER — Encounter (HOSPITAL_COMMUNITY): Payer: Self-pay | Admitting: Emergency Medicine

## 2012-12-16 ENCOUNTER — Emergency Department (HOSPITAL_COMMUNITY)
Admission: EM | Admit: 2012-12-16 | Discharge: 2012-12-16 | Disposition: A | Discharge status: Home / Self Care | Payer: No Typology Code available for payment source | Source: Home / Self Care | Attending: Family Medicine | Admitting: Family Medicine

## 2012-12-16 DIAGNOSIS — S139XXA Sprain of joints and ligaments of unspecified parts of neck, initial encounter: Secondary | ICD-10-CM

## 2012-12-16 DIAGNOSIS — S161XXA Strain of muscle, fascia and tendon at neck level, initial encounter: Secondary | ICD-10-CM

## 2012-12-16 LAB — FERRITIN: Ferritin: 11 ng/mL (ref 10–291)

## 2012-12-16 NOTE — ED Notes (Signed)
C/o left sided neck pain that radiates up behind left ear causing pain.  Muscle tightness in shoulder blades.  States pain is felt with bending head.  Sharp pain that comes and goes.  No otc meds taken for symptoms.  On set 2 wks ago.

## 2012-12-16 NOTE — ED Provider Notes (Signed)
CSN: [id number]     Arrival date & time 12/16/12  1058 History   First MD Initiated Contact with Patient 12/16/12 1144     Chief Complaint  Patient presents with  . Neck Pain  . Otalgia   (Consider location/radiation/quality/duration/timing/severity/associated sxs/prior Treatment) HPI Comments: Denies recent illness or injury. Reports she has been seen for same twice in past two weeks, including by her PCP yesterday. Both MD's have recommended the use of ibuprofen for symptoms of muscle tension/strian. She has not tried this medication yet, as of today's visit.   Patient is a 47 y.o. female presenting with shoulder pain. The history is provided by the patient.  Shoulder Pain This is a recurrent problem. The current episode started more than 1 week ago (began 2 weeks ago). The problem occurs rarely. Pertinent negatives include no chest pain, no abdominal pain, no headaches and no shortness of breath. Associated symptoms comments: States she will occasional experience discomfort across tops of both shoulders with occasional "sharp/shooting" pain up later neck. Symptoms last only 1-2 seconds.. Exacerbated by: turining head from side to side. She has tried nothing for the symptoms.    Past Medical History  Diagnosis Date  . Migraine   . Obesity   . Allergy   . Osgood-Schlatter/osteochondroses    History reviewed. No pertinent past surgical history. Family History  Problem Relation Age of Onset  . Diabetes Mother   . Hypertension Mother   . Hyperlipidemia Mother   . Hypertension Brother    History  Substance Use Topics  . Smoking status: Never Smoker   . Smokeless tobacco: Never Used  . Alcohol Use: No   OB History   Grav Para Term Preterm Abortions TAB SAB Ect Mult Living                 Review of Systems  Respiratory: Negative for shortness of breath.   Cardiovascular: Negative for chest pain.  Gastrointestinal: Negative for abdominal pain.  Neurological: Negative for  headaches.  All other systems reviewed and are negative.    Allergies  Codeine; Crab; and Penicillins  Home Medications   Current Outpatient Rx  Name  Route  Sig  Dispense  Refill  . Multiple Vitamin (MULTIVITAMIN) tablet   Oral   Take 1 tablet by mouth daily.          BP 120/76  Pulse 77  Temp(Src) 98.6 F (37 C) (Oral)  Resp 16  SpO2 98%  LMP 12/07/2012 Physical Exam  Nursing note and vitals reviewed. Constitutional: She is oriented to person, place, and time. She appears well-developed and well-nourished. No distress.  HENT:  Head: Normocephalic and atraumatic.  Right Ear: External ear normal.  Left Ear: External ear normal.  Nose: Nose normal.  Mouth/Throat: Oropharynx is clear and moist.  Eyes: Conjunctivae and EOM are normal. Pupils are equal, round, and reactive to light. Right eye exhibits no discharge. Left eye exhibits no discharge. No scleral icterus.  Neck: Trachea normal, normal range of motion and phonation normal. Neck supple. No JVD present. Muscular tenderness present. No spinous process tenderness present. No rigidity. No tracheal deviation, no edema, no erythema and normal range of motion present. No Brudzinski's sign and no Kernig's sign noted. No mass and no thyromegaly present.    Cardiovascular: Normal rate, regular rhythm and normal heart sounds.   Pulmonary/Chest: Effort normal and breath sounds normal. No stridor. No respiratory distress.  Abdominal: Soft. There is no tenderness.  Musculoskeletal: Normal range of motion.  She exhibits no edema and no tenderness.  Full ROM at both UE's with intact CSM exam of both UE's  Lymphadenopathy:    She has no cervical adenopathy.  Neurological: She is alert and oriented to person, place, and time. She has normal reflexes.  Grip strength normal and equal at B/L hands  Skin: Skin is warm and dry. No rash noted. No erythema.  Psychiatric: She has a normal mood and affect. Her behavior is normal.    ED  Course  Procedures (including critical care time) Labs Review Labs Reviewed - No data to display Imaging Review No results found.  EKG Interpretation    Date/Time:    Ventricular Rate:    PR Interval:    QRS Duration:   QT Interval:    QTC Calculation:   R Axis:     Text Interpretation:              MDM  I suggested to patient I would recommend using either tylenol or ibuprofen as directed on packaging for your occasional neck discomfort. Certainly if your symptoms go beyond what can easily be controlled by either of these medications, I would suggest you arrange an appointment with your primary care provider for further evaluation. Explained to patient that her exam today was without worrisome finding or deficit.    Jess Barters [place], [place] 12/16/12 1353

## 2012-12-17 NOTE — ED Provider Notes (Signed)
Medical screening examination/treatment/procedure(s) were performed by a resident physician or non-physician practitioner and as the supervising physician I was immediately available for consultation/collaboration.  Evan Corey, MD    Evan S Corey, MD 12/17/12 0803 

## 2012-12-18 ENCOUNTER — Telehealth: Payer: Self-pay | Admitting: Family Medicine

## 2012-12-18 NOTE — Telephone Encounter (Signed)
Request results from labwork from 12/15/12.

## 2012-12-18 NOTE — Telephone Encounter (Signed)
Will FWD to MD.  Liban Guedes L, CMA  

## 2012-12-19 NOTE — Telephone Encounter (Signed)
Ferritin level was normal. She should continue to take her iron daily, with orange juice to aid in absorption. Please call pt to inform.  Latrelle Dodrill, MD

## 2012-12-19 NOTE — Telephone Encounter (Signed)
Spoke with pt, gave message below.  Pt verbalized understanding.  Emalina Dubreuil, Darlyne Russian, CMA

## 2012-12-31 ENCOUNTER — Encounter: Payer: Self-pay | Admitting: Family Medicine

## 2012-12-31 ENCOUNTER — Ambulatory Visit (INDEPENDENT_AMBULATORY_CARE_PROVIDER_SITE_OTHER): Payer: No Typology Code available for payment source | Admitting: Family Medicine

## 2012-12-31 VITALS — BP 128/82 | HR 76 | Temp 98.0°F | Ht 68.5 in | Wt 230.0 lb

## 2012-12-31 DIAGNOSIS — M62838 Other muscle spasm: Secondary | ICD-10-CM | POA: Insufficient documentation

## 2012-12-31 MED ORDER — CYCLOBENZAPRINE HCL 5 MG PO TABS: 5.0000 mg | ORAL_TABLET | Freq: Every day | ORAL | Status: DC

## 2012-12-31 NOTE — Assessment & Plan Note (Signed)
Patient with L trapezius tenderness on palpation, negative exam for other causes (negative for cervical radiculopathy; impingement).  Outlined specific plan (see AVS) for treatment, plan for follow up if not improved in the coming 7 to 10 days.  Instructions regarding effects of sedation from cyclobenzaprine; patient reports she had taken this medicine in the past when prescribed three times daily, and she could not tolerate it due to sedation.  Will use sparingly at this time.  Heat and ibuprofen also incorporated in plan.

## 2012-12-31 NOTE — Patient Instructions (Signed)
It was a pleasure to see you today.  I believe your neck strain is the cause of your pain along the left shoulder and the base of your head.   OUR PLAN:   1. Cyclobenzaprine 5mg  tablets, take one tablet by mouth ONLY AT BEDTIME, to avoid over-sedation.  Avoid alcohol to prevent becoming overly sedated.   2. Ibuprofen 200mg  tablets, take 2 to 4 tablets by mouth (with something in your stomach) every 8 hours for the coming 5 to 7 days, then as-needed.  3. Heating pad to the left shoulder frequently during the day.    Please follow up with your primary doctor if you have not improved in the coming 7 to 10 days.

## 2012-12-31 NOTE — Progress Notes (Signed)
   Subjective:    Patient ID: Rachel Cortez, female    DOB: 05-06-1965, 47 y.o.   MRN: [id number]  HPI Patient seen today for this SDA, patient reports about a month of left-sided shoulder/neck/base-of-head pain for which she was seen on 11/30 in ED.  TOld then she had a muscle spasm but was unsure what she should do for this following the visit.  Has not sustained any trauma or falls, no MVA or prior history of whiplash.  She is right-hand dominant.  Also notes that her hands/fingers are often numb when she wakes up in the morning, sensation is restored normally once she gets up and going in the morning.   She denies fevers/chills, no cough, dyspnea or chest pain.  She has had the flu shot this season.  She has been having ongoing anxiety about hot flashes and the sensation that she is going through menopause, is currently on day six of her menses and reports she had a 'hot flash' last week (one time only).    Family Hx; Mother went through menopause early (before age 58).   Social Hx; Does not drink alcohol.  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  Review of Systems     Objective:   Physical Exam Generally well-appearing albeit anxious-appearing, no apparent distress HEENT neck supple. TMs clear bilat, clear oropharynx.  No cervical or supraclavicular adenopathy. Able to move neck in all planes with full active ROM (flexion/extension, rotation L and R, and sidebending).  No point tenderness over cervical vertebrae.  Tenderness to palpation over the L trapezius with gentle palpation. Negative Spurlings test.  Empty can test negative R and L.  Handgrip full and symmetric bilaterally. Sensation grossly intact bilaterally. No active synovitis in hands.  Tinnels and Phalens tests negative bilat.  Shoulder shrug symmetric and full bilaterally. UE strength full and intact symmetrically. COR regular S1S2,  PULM Clear bilaterally, no rales or wheezes.         Assessment & Plan:

## 2013-01-14 ENCOUNTER — Ambulatory Visit: Payer: No Typology Code available for payment source | Admitting: Family Medicine

## 2013-01-28 ENCOUNTER — Encounter: Payer: Self-pay | Admitting: Family Medicine

## 2013-01-28 ENCOUNTER — Ambulatory Visit (INDEPENDENT_AMBULATORY_CARE_PROVIDER_SITE_OTHER): Payer: No Typology Code available for payment source | Admitting: Family Medicine

## 2013-01-28 VITALS — BP 136/84 | HR 72 | Temp 98.4°F | Wt 226.0 lb

## 2013-01-28 DIAGNOSIS — R251 Tremor, unspecified: Secondary | ICD-10-CM

## 2013-01-28 DIAGNOSIS — R259 Unspecified abnormal involuntary movements: Secondary | ICD-10-CM

## 2013-01-28 LAB — COMPREHENSIVE METABOLIC PANEL
ALT: 22 U/L (ref 0–35)
AST: 17 U/L (ref 0–37)
Albumin: 3.8 g/dL (ref 3.5–5.2)
Alkaline Phosphatase: 61 U/L (ref 39–117)
BILIRUBIN TOTAL: 0.5 mg/dL (ref 0.3–1.2)
BUN: 8 mg/dL (ref 6–23)
CO2: 27 mEq/L (ref 19–32)
CREATININE: 0.78 mg/dL (ref 0.50–1.10)
Calcium: 9.2 mg/dL (ref 8.4–10.5)
Chloride: 105 mEq/L (ref 96–112)
Glucose, Bld: 85 mg/dL (ref 70–99)
Potassium: 3.8 mEq/L (ref 3.5–5.3)
Sodium: 139 mEq/L (ref 135–145)
Total Protein: 6.4 g/dL (ref 6.0–8.3)

## 2013-01-28 LAB — MAGNESIUM: Magnesium: 1.8 mg/dL (ref 1.5–2.5)

## 2013-01-28 LAB — FSH/LH
FSH: 8.7 m[IU]/mL
LH: 4 m[IU]/mL

## 2013-01-28 NOTE — Assessment & Plan Note (Signed)
Shakiness, mood swings, irregular menses, concern for menopause - will check FSH, CMP and mag -TSH, CBC normal recently - consider starting SSRI for anxiety/perimenopausal symptoms - restart black cohosh supplement, reviewed today and contains standard multivitamin as well

## 2013-01-28 NOTE — Patient Instructions (Addendum)
Menopause Menopause is the normal time of life when menstrual periods stop completely. Menopause is complete when you have missed 12 consecutive menstrual periods. It usually occurs between the ages of 8 years and 48 years. Very rarely does a woman develop menopause before the age of 60 years. At menopause, your ovaries stop producing the female hormones estrogen and progesterone. This can cause undesirable symptoms and also affect your health. Sometimes the symptoms may occur 4 5 years before the menopause begins. There is no relationship between menopause and:  Oral contraceptives.  Number of children you had.  Race.  The age your menstrual periods started (menarche). Heavy smokers and very thin women may develop menopause earlier in life. CAUSES  The ovaries stop producing the female hormones estrogen and progesterone.  Other causes include:  Surgery to remove both ovaries.  The ovaries stop functioning for no known reason.  Tumors of the pituitary gland in the brain.  Medical disease that affects the ovaries and hormone production.  Radiation treatment to the abdomen or pelvis.  Chemotherapy that affects the ovaries. SYMPTOMS   Hot flashes.  Night sweats.  Decrease in sex drive.  Vaginal dryness and thinning of the vagina causing painful intercourse.  Dryness of the skin and developing wrinkles.  Headaches.  Tiredness.  Irritability.  Memory problems.  Weight gain.  Bladder infections.  Hair growth of the face and chest.  Infertility. More serious symptoms include:  Loss of bone (osteoporosis) causing breaks (fractures).  Depression.  Hardening and narrowing of the arteries (atherosclerosis) causing heart attacks and strokes. DIAGNOSIS   When the menstrual periods have stopped for 12 straight months.  Physical exam.  Hormone studies of the blood. TREATMENT  There are many treatment choices and nearly as many questions about them. The  decisions to treat or not to treat menopausal changes is an individual choice made with your health care provider. Your health care provider can discuss the treatments with you. Together, you can decide which treatment will work best for you. Your treatment choices may include:   Hormone therapy (estrogen and progesterone).  Non-hormonal medicines.  Treating the individual symptoms with medicine (for example antidepressants for depression).  Herbal medicines that may help specific symptoms.  Counseling by a psychiatrist or psychologist.  Group therapy.  Lifestyle changes including:  Eating healthy.  Regular exercise.  Limiting caffeine and alcohol.  Stress management and meditation.  No treatment. HOME CARE INSTRUCTIONS   Take the medicine your health care provider gives you as directed.  Get plenty of sleep and rest.  Exercise regularly.  Eat a diet that contains calcium (good for the bones) and soy products (acts like estrogen hormone).  Avoid alcoholic beverages.  Do not smoke.  If you have hot flashes, dress in layers.  Take supplements, calcium, and vitamin D to strengthen bones.  You can use over-the-counter lubricants or moisturizers for vaginal dryness.  Group therapy is sometimes very helpful.  Acupuncture may be helpful in some cases. SEEK MEDICAL CARE IF:   You are not sure you are in menopause.  You are having menopausal symptoms and need advice and treatment.  You are still having menstrual periods after age 71 years.  You have pain with intercourse.  Menopause is complete (no menstrual period for 12 months) and you develop vaginal bleeding.  You need a referral to a specialist (gynecologist, psychiatrist, or psychologist) for treatment. SEEK IMMEDIATE MEDICAL CARE IF:   You have severe depression.  You have excessive vaginal bleeding.  You fell and think you have a broken bone.  You have pain when you urinate.  You develop leg or  chest pain.  You have a fast pounding heart beat (palpitations).  You have severe headaches.  You develop vision problems.  You feel a lump in your breast.  You have abdominal pain or severe indigestion. Document Released: 03/17/2003 Document Revised: 08/27/2012 Document Reviewed: 07/24/2012 [facility] Patient Information 2014 [place], [place].

## 2013-01-28 NOTE — Progress Notes (Signed)
   Subjective:    Patient ID: Rachel Cortez, female    DOB: 1965-01-29, 48 y.o.   MRN: [id number]  HPI Pt complains of 3 years of mood swings, now accompanied by shakiness and occasional palpitations for the past 3 months, as well as hot flashes. She is concerned that she is "going through the change." She reports several episodes of sudden onset flushing and feeling hot everywhere except her feet which are always cold. She presented to the ED for this and was told she was anemic but then came to clinic and was told that was unlikely to cause her symptoms. She previously took Cortez herbal vitamin/supplement containing black cohosh which helped but she stopped because she was worried it wasn't safe. She also endorses irregular heavy periods.   Review of Systems  Constitutional: Positive for diaphoresis. Negative for fever, chills, activity change, appetite change, fatigue and unexpected weight change.  Respiratory: Negative for apnea, chest tightness and shortness of breath.   Cardiovascular: Positive for palpitations. Negative for chest pain and leg swelling.  Gastrointestinal: Negative for nausea, abdominal pain, diarrhea, constipation and blood in stool.  Endocrine: Negative for cold intolerance and heat intolerance.  Genitourinary: Positive for menstrual problem.  All other systems reviewed and are negative.       Objective:   Physical Exam  Nursing note and vitals reviewed. Constitutional: She is oriented to person, place, and time. She appears well-developed and well-nourished. No distress.  HENT:  Head: Normocephalic and atraumatic.  Right Ear: External ear normal.  Left Ear: External ear normal.  Nose: Nose normal.  Eyes: Conjunctivae are normal. Right eye exhibits no discharge. Left eye exhibits no discharge. No scleral icterus.  Neck: Normal range of motion. No thyromegaly present.  Cardiovascular: Normal rate, regular rhythm, normal heart sounds and intact distal pulses.     No murmur heard. Pulmonary/Chest: Effort normal. No respiratory distress. She has no wheezes.  Abdominal: Soft. She exhibits no distension. There is no tenderness.  Musculoskeletal: Normal range of motion. She exhibits no edema and no tenderness.  Neurological: She is alert and oriented to person, place, and time.  Skin: Skin is warm and dry. She is not diaphoretic.  Psychiatric: Her behavior is normal.  Somewhat anxious appearing          Assessment & Plan:

## 2013-01-30 ENCOUNTER — Encounter: Payer: Self-pay | Admitting: Family Medicine

## 2013-01-31 ENCOUNTER — Emergency Department (HOSPITAL_COMMUNITY)
Admission: EM | Admit: 2013-01-31 | Discharge: 2013-01-31 | Disposition: A | Discharge status: Home / Self Care | Payer: No Typology Code available for payment source | Source: Home / Self Care | Attending: Family Medicine | Admitting: Family Medicine

## 2013-01-31 ENCOUNTER — Encounter (HOSPITAL_COMMUNITY): Payer: Self-pay | Admitting: Emergency Medicine

## 2013-01-31 ENCOUNTER — Emergency Department (INDEPENDENT_AMBULATORY_CARE_PROVIDER_SITE_OTHER): Payer: No Typology Code available for payment source

## 2013-01-31 DIAGNOSIS — R071 Chest pain on breathing: Secondary | ICD-10-CM

## 2013-01-31 DIAGNOSIS — S46919A Strain of unspecified muscle, fascia and tendon at shoulder and upper arm level, unspecified arm, initial encounter: Secondary | ICD-10-CM

## 2013-01-31 DIAGNOSIS — M549 Dorsalgia, unspecified: Secondary | ICD-10-CM

## 2013-01-31 DIAGNOSIS — R0789 Other chest pain: Secondary | ICD-10-CM

## 2013-01-31 HISTORY — DX: Cardiac murmur, unspecified: R01.1

## 2013-01-31 HISTORY — DX: Anemia, unspecified: D64.9

## 2013-01-31 MED ORDER — NAPROXEN 500 MG PO TABS: 500.0000 mg | ORAL_TABLET | Freq: Two times a day (BID) | ORAL | Status: DC

## 2013-01-31 NOTE — ED Provider Notes (Signed)
Medical screening examination/treatment/procedure(s) were performed by a resident physician or non-physician practitioner and as the supervising physician I was immediately available for consultation/collaboration.  Carlota Philley, MD    Anastyn Ayars S Antwion Carpenter, MD 01/31/13 1446 

## 2013-01-31 NOTE — ED Provider Notes (Signed)
CSN: [id number]     Arrival date & time 01/31/13  1025 History   None    Chief Complaint  Patient presents with  . Back Pain  . Chest Pain  . Arm Pain   (Consider location/radiation/quality/duration/timing/severity/associated sxs/prior Treatment) HPI Comments: 48 year old female presents complaining of left upper back pain, left arm pain, tingling in both hands, and bilateral chest pain. The back and arm pain and tingling have been going on for months. She is seeing her primary care physician multiple times and has been to the emergency department multiple times for this problem in the past couple of months. The chest pain is intermittent and has been happening since yesterday. She feels pain in her left chest up on her shoulder, and also had some mild pain in her right chest. She states she does a lot of pushing heavy objects at work and believes this is causing muscular or chest wall pain.  This is not associated with any other symptoms. All other review of systems are negative.  Patient is a 48 y.o. female presenting with back pain, chest pain, and arm pain.  Back Pain Associated symptoms: chest pain and numbness   Associated symptoms: no abdominal pain, no dysuria, no fever and no weakness   Chest Pain Associated symptoms: back pain and numbness   Associated symptoms: no abdominal pain, no cough, no dizziness, no fever, no nausea, no palpitations, no shortness of breath, not vomiting and no weakness   Arm Pain Associated symptoms include chest pain. Pertinent negatives include no abdominal pain and no shortness of breath.    Past Medical History  Diagnosis Date  . Migraine   . Obesity   . Allergy   . Osgood-Schlatter/osteochondroses   . Anemia   . Heart murmur    History reviewed. No pertinent past surgical history. Family History  Problem Relation Age of Onset  . Diabetes Mother   . Hypertension Mother   . Hyperlipidemia Mother   . Hypertension Brother    History    Substance Use Topics  . Smoking status: Never Smoker   . Smokeless tobacco: Never Used  . Alcohol Use: No   OB History   Grav Para Term Preterm Abortions TAB SAB Ect Mult Living                 Review of Systems  Constitutional: Negative for fever and chills.  Eyes: Negative for visual disturbance.  Respiratory: Negative for cough and shortness of breath.   Cardiovascular: Positive for chest pain. Negative for palpitations and leg swelling.  Gastrointestinal: Negative for nausea, vomiting and abdominal pain.  Endocrine: Negative for polydipsia and polyuria.  Genitourinary: Negative for dysuria, urgency and frequency.  Musculoskeletal: Positive for arthralgias and back pain. Negative for myalgias.  Skin: Negative for rash.  Neurological: Positive for numbness. Negative for dizziness, weakness and light-headedness.    Allergies  Codeine; Crab; and Penicillins  Home Medications   Current Outpatient Rx  Name  Route  Sig  Dispense  Refill  . cyclobenzaprine (FLEXERIL) 5 MG tablet   Oral   Take 1 tablet (5 mg total) by mouth at bedtime.   20 tablet   0   . ibuprofen (ADVIL,MOTRIN) 800 MG tablet   Oral   Take 800 mg by mouth every 8 (eight) hours as needed.         . Multiple Vitamin (MULTIVITAMIN) tablet   Oral   Take 1 tablet by mouth daily.         Marland Kitchen  naproxen (NAPROSYN) 500 MG tablet   Oral   Take 1 tablet (500 mg total) by mouth 2 (two) times daily.   60 tablet   0    BP 145/77  Pulse 65  Temp(Src) 98.6 F (37 C) (Oral)  Resp 16  SpO2 100%  LMP 01/27/2013 Physical Exam  Nursing note and vitals reviewed. Constitutional: She is oriented to person, place, and time. Vital signs are normal. She appears well-developed and well-nourished. No distress.  HENT:  Head: Normocephalic and atraumatic.  Eyes: Conjunctivae are normal. Right eye exhibits no discharge. Left eye exhibits no discharge.  Neck: Normal range of motion. Neck supple. No JVD present.   Cardiovascular: Normal rate, regular rhythm and normal heart sounds.  Exam reveals no gallop and no friction rub.   No murmur heard. Pulmonary/Chest: Effort normal and breath sounds normal. No respiratory distress. She has no wheezes. She has no rales. She exhibits tenderness (left chest musculature).  Musculoskeletal:       Cervical back: She exhibits tenderness. She exhibits normal range of motion, no bony tenderness, no deformity and no spasm.       Back:  Neurological: She is alert and oriented to person, place, and time. She has normal strength. Coordination normal.  Skin: Skin is warm and dry. No rash noted. She is not diaphoretic.  Psychiatric: She has a normal mood and affect. Judgment normal.    ED Course  Procedures (including critical care time) Labs Review Labs Reviewed - No data to display Imaging Review Dg Chest 2 View  01/31/2013   CLINICAL DATA:  Cough for the past 2 weeks.  EXAM: CHEST  2 VIEW  COMPARISON:  Chest x-ray 12/07/2012.  FINDINGS: Lung volumes are normal. No consolidative airspace disease. No pleural effusions. No pneumothorax. No pulmonary nodule or mass noted. Pulmonary vasculature and the cardiomediastinal silhouette are within normal limits.  IMPRESSION: 1.  No radiographic evidence of acute cardiopulmonary disease.   Electronically Signed   By: Vinnie Langton M.D.   On: 01/31/2013 10:15   EKG reviewed, normal, unchanged   MDM   1. Back pain   2. Infraspinatus strain   3. Chest wall pain    Do not suspect cardiac related chest pain at this time. We'll treat for muscle strain with Aleve twice daily. She should do this for 2 weeks, then we'll stop the Aleve. She will followup with her primary care physician. ED if worsening chest pain.  Meds ordered this encounter  Medications  . ibuprofen (ADVIL,MOTRIN) 800 MG tablet    Sig: Take 800 mg by mouth every 8 (eight) hours as needed.  . naproxen (NAPROSYN) 500 MG tablet    Sig: Take 1 tablet (500 mg  total) by mouth 2 (two) times daily.    Dispense:  60 tablet    Refill:  0    Order Specific Question:  Supervising Provider    Answer:  Ihor Gully D [[id number]]       Liam Graham, PA-C 01/31/13 1025

## 2013-01-31 NOTE — Discharge Instructions (Signed)
Back Exercises °Back exercises help treat and prevent back injuries. The goal of back exercises is to increase the strength of your abdominal and back muscles and the flexibility of your back. These exercises should be started when you no longer have back pain. Back exercises include: °· Pelvic Tilt. Lie on your back with your knees bent. Tilt your pelvis until the lower part of your back is against the floor. Hold this position 5 to 10 sec and repeat 5 to 10 times. °· Knee to Chest. Pull first 1 knee up against your chest and hold for 20 to 30 seconds, repeat this with the other knee, and then both knees. This may be done with the other leg straight or bent, whichever feels better. °· Sit-Ups or Curl-Ups. Bend your knees 90 degrees. Start with tilting your pelvis, and do a partial, slow sit-up, lifting your trunk only 30 to 45 degrees off the floor. Take at least 2 to 3 seconds for each sit-up. Do not do sit-ups with your knees out straight. If partial sit-ups are difficult, simply do the above but with only tightening your abdominal muscles and holding it as directed. °· Hip-Lift. Lie on your back with your knees flexed 90 degrees. Push down with your feet and shoulders as you raise your hips a couple inches off the floor; hold for 10 seconds, repeat 5 to 10 times. °· Back arches. Lie on your stomach, propping yourself up on bent elbows. Slowly press on your hands, causing an arch in your low back. Repeat 3 to 5 times. Any initial stiffness and discomfort should lessen with repetition over time. °· Shoulder-Lifts. Lie face down with arms beside your body. Keep hips and torso pressed to floor as you slowly lift your head and shoulders off the floor. °Do not overdo your exercises, especially in the beginning. Exercises may cause you some mild back discomfort which lasts for a few minutes; however, if the pain is more severe, or lasts for more than 15 minutes, do not continue exercises until you see your caregiver.  Improvement with exercise therapy for back problems is slow.  °See your caregivers for assistance with developing a proper back exercise program. °Document Released: 02/02/2004 Document Revised: 03/19/2011 Document Reviewed: 10/26/2010 °ExitCare® Patient Information ©2014 [employer]. ° °Back Pain, Adult °Low back pain is very common. About 1 in 5 people have back pain. The cause of low back pain is rarely dangerous. The pain often gets better over time. About half of people with a sudden onset of back pain feel better in just 2 weeks. About 8 in 10 people feel better by 6 weeks.  °CAUSES °Some common causes of back pain include: °· Strain of the muscles or ligaments supporting the spine. °· Wear and tear (degeneration) of the spinal discs. °· Arthritis. °· Direct injury to the back. °DIAGNOSIS °Most of the time, the direct cause of low back pain is not known. However, back pain can be treated effectively even when the exact cause of the pain is unknown. Answering your caregiver's questions about your overall health and symptoms is one of the most accurate ways to make sure the cause of your pain is not dangerous. If your caregiver needs more information, he or she may order lab work or imaging tests (X-rays or MRIs). However, even if imaging tests show changes in your back, this usually does not require surgery. °HOME CARE INSTRUCTIONS °For many people, back pain returns. Since low back pain is rarely dangerous, it is often a condition that people   can learn to [facility] their own.   Remain active. It is stressful on the back to sit or stand in one place. Do not sit, drive, or stand in one place for more than 30 minutes at a time. Take short walks on level surfaces as soon as pain allows.Try to increase the length of time you walk each day.  Do not stay in bed.Resting more than 1 or 2 days can delay your recovery.  Do not avoid exercise or work.Your body is made to move.It is not dangerous to be active,  even though your back may hurt.Your back will likely heal faster if you return to being active before your pain is gone.  Pay attention to your body when you bend and lift. Many people have less discomfortwhen lifting if they bend their knees, keep the load close to their bodies,and avoid twisting. Often, the most comfortable positions are those that put less stress on your recovering back.  Find a comfortable position to sleep. Use a firm mattress and lie on your side with your knees slightly bent. If you lie on your back, put a pillow under your knees.  Only take over-the-counter or prescription medicines as directed by your caregiver. Over-the-counter medicines to reduce pain and inflammation are often the most helpful.Your caregiver may prescribe muscle relaxant drugs.These medicines help dull your pain so you can more quickly return to your normal activities and healthy exercise.  Put ice on the injured area.  Put ice in a plastic bag.  Place a towel between your skin and the bag.  Leave the ice on for 15-20 minutes, 03-04 times a day for the first 2 to 3 days. After that, ice and heat may be alternated to reduce pain and spasms.  Ask your caregiver about trying back exercises and gentle massage. This may be of some benefit.  Avoid feeling anxious or stressed.Stress increases muscle tension and can worsen back pain.It is important to recognize when you are anxious or stressed and learn ways to manage it.Exercise is a great option. SEEK MEDICAL CARE IF:  You have pain that is not relieved with rest or medicine.  You have pain that does not improve in 1 week.  You have new symptoms.  You are generally not feeling well. SEEK IMMEDIATE MEDICAL CARE IF:   You have pain that radiates from your back into your legs.  You develop new bowel or bladder control problems.  You have unusual weakness or numbness in your arms or legs.  You develop nausea or vomiting.  You develop  abdominal pain.  You feel faint. Document Released: 12/25/2004 Document Revised: 06/26/2011 Document Reviewed: 05/15/2010 [facility] Patient Information 2014 [place], [place].  Chest Pain (Nonspecific) It is often hard to give a specific diagnosis for the cause of chest pain. There is always a chance that your pain could be related to something serious, such as a heart attack or a blood clot in the lungs. You need to follow up with your caregiver for further evaluation. CAUSES   Heartburn.  Pneumonia or bronchitis.  Anxiety or stress.  Inflammation around your heart (pericarditis) or lung (pleuritis or pleurisy).  A blood clot in the lung.  A collapsed lung (pneumothorax). It can develop suddenly on its own (spontaneous pneumothorax) or from injury (trauma) to the chest.  Shingles infection (herpes zoster virus). The chest wall is composed of bones, muscles, and cartilage. Any of these can be the source of the pain.  The bones can be bruised  by injury.  The muscles or cartilage can be strained by coughing or overwork.  The cartilage can be affected by inflammation and become sore (costochondritis). DIAGNOSIS  Lab tests or other studies, such as X-rays, electrocardiography, stress testing, or cardiac imaging, may be needed to find the cause of your pain.  TREATMENT   Treatment depends on what may be causing your chest pain. Treatment may include:  Acid blockers for heartburn.  Anti-inflammatory medicine.  Pain medicine for inflammatory conditions.  Antibiotics if an infection is present.  You may be advised to change lifestyle habits. This includes stopping smoking and avoiding alcohol, caffeine, and chocolate.  You may be advised to keep your head raised (elevated) when sleeping. This reduces the chance of acid going backward from your stomach into your esophagus.  Most of the time, nonspecific chest pain will improve within 2 to 3 days with rest and mild pain  medicine. HOME CARE INSTRUCTIONS   If antibiotics were prescribed, take your antibiotics as directed. Finish them even if you start to feel better.  For the next few days, avoid physical activities that bring on chest pain. Continue physical activities as directed.  Do not smoke.  Avoid drinking alcohol.  Only take over-the-counter or prescription medicine for pain, discomfort, or fever as directed by your caregiver.  Follow your caregiver's suggestions for further testing if your chest pain does not go away.  Keep any follow-up appointments you made. If you do not go to an appointment, you could develop lasting (chronic) problems with pain. If there is any problem keeping an appointment, you must call to reschedule. SEEK MEDICAL CARE IF:   You think you are having problems from the medicine you are taking. Read your medicine instructions carefully.  Your chest pain does not go away, even after treatment.  You develop a rash with blisters on your chest. SEEK IMMEDIATE MEDICAL CARE IF:   You have increased chest pain or pain that spreads to your arm, neck, jaw, back, or abdomen.  You develop shortness of breath, an increasing cough, or you are coughing up blood.  You have severe back or abdominal pain, feel nauseous, or vomit.  You develop severe weakness, fainting, or chills.  You have a fever. THIS IS AN EMERGENCY. Do not wait to see if the pain will go away. Get medical help at once. Call your local emergency services (911 in [place] Do not drive yourself to the hospital. MAKE SURE YOU:   Understand these instructions.  Will watch your condition.  Will get help right away if you are not doing well or get worse. Document Released: 10/04/2004 Document Revised: 03/19/2011 Document Reviewed: 07/31/2007 [facility] Patient Information 2014 [place].  Chest Wall Pain Chest wall pain is pain in or around the bones and muscles of your chest. It may take up to 6 weeks to get  better. It may take longer if you must stay physically active in your work and activities.  CAUSES  Chest wall pain may happen on its own. However, it may be caused by:  A viral illness like the flu.  Injury.  Coughing.  Exercise.  Arthritis.  Fibromyalgia.  Shingles. HOME CARE INSTRUCTIONS   Avoid overtiring physical activity. Try not to strain or perform activities that cause pain. This includes any activities using your chest or your abdominal and side muscles, especially if heavy weights are used.  Put ice on the sore area.  Put ice in a plastic bag.  Place a towel  between your skin and the bag.  Leave the ice on for 15-20 minutes per hour while awake for the first 2 days.  Only take over-the-counter or prescription medicines for pain, discomfort, or fever as directed by your caregiver. SEEK IMMEDIATE MEDICAL CARE IF:   Your pain increases, or you are very uncomfortable.  You have a fever.  Your chest pain becomes worse.  You have new, unexplained symptoms.  You have nausea or vomiting.  You feel sweaty or lightheaded.  You have a cough with phlegm (sputum), or you cough up blood. MAKE SURE YOU:   Understand these instructions.  Will watch your condition.  Will get help right away if you are not doing well or get worse. Document Released: 12/25/2004 Document Revised: 03/19/2011 Document Reviewed: 08/21/2010 [facility] Patient Information 2014 [place].

## 2013-01-31 NOTE — ED Notes (Signed)
C/O intermittent left upper back pain and tenderness over past couple months with some left upper arm "tightness".  States has had 2 EKGs within last couple months (ED & PCP office).  Has since started with intermittent left chest pains - denies at present.  C/O ongoing BUE tingling in fingers.  Has tried cyclobenzaprine, IBU, and heating pad without relief.  Also c/o dry cough.

## 2013-02-10 ENCOUNTER — Ambulatory Visit: Payer: Self-pay

## 2013-02-17 ENCOUNTER — Ambulatory Visit: Payer: Self-pay

## 2013-02-18 ENCOUNTER — Encounter: Payer: Self-pay | Admitting: Sports Medicine

## 2013-02-18 ENCOUNTER — Ambulatory Visit (INDEPENDENT_AMBULATORY_CARE_PROVIDER_SITE_OTHER): Payer: Self-pay | Admitting: Sports Medicine

## 2013-02-18 VITALS — BP 141/91 | HR 67 | Temp 98.0°F | Ht 68.5 in | Wt 228.0 lb

## 2013-02-18 DIAGNOSIS — M999 Biomechanical lesion, unspecified: Secondary | ICD-10-CM

## 2013-02-18 DIAGNOSIS — M25519 Pain in unspecified shoulder: Secondary | ICD-10-CM

## 2013-02-18 DIAGNOSIS — M9902 Segmental and somatic dysfunction of thoracic region: Secondary | ICD-10-CM

## 2013-02-18 DIAGNOSIS — S43429A Sprain of unspecified rotator cuff capsule, initial encounter: Secondary | ICD-10-CM

## 2013-02-18 DIAGNOSIS — S46019A Strain of muscle(s) and tendon(s) of the rotator cuff of unspecified shoulder, initial encounter: Secondary | ICD-10-CM

## 2013-02-18 DIAGNOSIS — M25512 Pain in left shoulder: Secondary | ICD-10-CM

## 2013-02-18 NOTE — Patient Instructions (Signed)
   OMT today  Exercises per handout.  Work up to doing 3 sets of 15 reps spread throughout the day.    Use ICE after your exercises.  Continue Ibuprofen three times per day daily for the next week then as needed   If you need anything prior to your next visit please call the clinic. Please Bring all medications or accurate medication list with you to each appointment; an accurate medication list is essential in providing you the best care possible.

## 2013-02-18 NOTE — Progress Notes (Signed)
Rachel Cortez - 48 y.o. female MRN [id number]  Date of birth: [birth year]  Chief Complaint  Patient presents with  . pain down left arm x 2 months   Problems/Dx addressed during visit General Plan and Pt Instructions  1. Left shoulder pain   2. Rotator cuff strain   3. Thoracic region somatic dysfunction       OMT today  Exercises per handout.  Work up to doing 3 sets of 15 reps spread throughout the day.    Use ICE after your exercises.  Continue Ibuprofen three times per day daily for the next week then as needed     HPI & ROS    She reports left shoulder/arm pain present for the past 2 months.  Pt denies any radicular symptoms, change in bowel or bladder habits, muscle weakness or falls associated with back pain.  No fevers, chills, night sweats or weight loss.  Previously diagnosed with a left trapezius muscle spasm started on Flexeril.  Has not seen significant improvement in this.  Has been taking ibuprofen as needed.  No specific exercises.  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  Overhead motion seems to aggravate this.  Denies any dropping or significant limitations in her motion.  Worse in the morning after sleeping with radiation to the base of her left on occasion.  History  Past Medical, Surgical, Social, and Family History Reviewed per EMR Medications and Allergies reviewed and all updated if necessary. Objective Findings  VITALS: HR: 67 bpm  BP: 141/91 mmHg  TEMP: 98 F (36.7 C) (Oral)  RESP:    HT: 5' 8.5" (174 cm)  WT: 228 lb (103.42 kg)  BMI: 34.2   BP Readings from Last 3 Encounters:  02/18/13 141/91  01/31/13 145/77  01/28/13 136/84   Wt Readings from Last 3 Encounters:  02/18/13 228 lb (103.42 kg)  01/28/13 226 lb (102.513 kg)  12/31/12 230 lb (104.327 kg)     PHYSICAL EXAM: GENERAL:  adult female. In no discomfort; no respiratory distress  PSYCH: alert and appropriate, good insight     HNEENT:  trachea is midline   EXTREM:   left shoulder/neck Exam: Appear:  normal-appearing, left shoulder is slightly higher riding than left   Palp:  diffuse muscle spasm in the left shoulder complex TTP over the bicipital groove, trapezius and levator scapula muscle spasms, teres minor tender point   ROM:  for active range of motion   NV:   bilateral upper extremity myotomes 5+/5 Bilateral upper extremity dermatomes 5+/5   Testing:  positive speeds, cross arm, painful arc, yergason Negative drop arm test, Hawkins, Marked left Shoulder dyskinesia    SOMATIC DYSFUNCTION CRANIAL   CERVICAL   THORACIC  T2-T6 rotated left   RIBS  rib4 posterior   LUMBAR   SACRUM   PELVIS   LE   UE     GU:   SKIN:     Medications, Labs & Other Orders   Previous Medications   IBUPROFEN (ADVIL,MOTRIN) 800 MG TABLET    Take 800 mg by mouth every 8 (eight) hours as needed.   MULTIPLE VITAMIN (MULTIVITAMIN) TABLET    Take 1 tablet by mouth daily.   Modified Medications   No medications on file   New Prescriptions   No medications on file   Discontinued Medications   CYCLOBENZAPRINE (FLEXERIL) 5 MG TABLET    Take 1 tablet (5 mg total) by mouth at  bedtime.   NAPROXEN (NAPROSYN) 500 MG TABLET    Take 1 tablet (500 mg total) by mouth 2 (two) times daily.  No orders of the defined types were placed in this encounter.   Assessment & Plan  As above & for further discussion see problem based charting if applicable.

## 2013-02-19 DIAGNOSIS — M25512 Pain in left shoulder: Secondary | ICD-10-CM | POA: Insufficient documentation

## 2013-02-19 DIAGNOSIS — S46019A Strain of muscle(s) and tendon(s) of the rotator cuff of unspecified shoulder, initial encounter: Secondary | ICD-10-CM | POA: Insufficient documentation

## 2013-02-19 DIAGNOSIS — M9902 Segmental and somatic dysfunction of thoracic region: Secondary | ICD-10-CM | POA: Insufficient documentation

## 2013-02-19 NOTE — Assessment & Plan Note (Signed)
Decision today to treat with OMT was based on Physical Exam.  Verbal consent was obtained after explanation of risks, benefits and potential side-effects including acute pain flare, post-manipulation soreness, and need for repeat treatments.    The Patient was treated with HVLA, ME techniques in thoracic and rib areas  Patient tolerated the procedure well with improvement in symptoms.  Patient given medications, exercises, stretches and lifestyle modifications per AVS and verbally.    Patient will follow up in 4 weeks if needed

## 2013-02-19 NOTE — Assessment & Plan Note (Signed)
No significant tear on exam.  Marked shoulder dyskinesia.  Question biceps tendinitis versus teres minor tendinitis. Rehabilitation exercises provided

## 2013-02-19 NOTE — Assessment & Plan Note (Signed)
See above

## 2013-03-10 ENCOUNTER — Ambulatory Visit: Payer: No Typology Code available for payment source

## 2013-03-11 ENCOUNTER — Ambulatory Visit (INDEPENDENT_AMBULATORY_CARE_PROVIDER_SITE_OTHER): Payer: No Typology Code available for payment source | Admitting: Family Medicine

## 2013-03-11 ENCOUNTER — Encounter: Payer: Self-pay | Admitting: Family Medicine

## 2013-03-11 VITALS — BP 124/63 | HR 70 | Temp 98.4°F | Ht 68.5 in | Wt 230.0 lb

## 2013-03-11 DIAGNOSIS — S46019A Strain of muscle(s) and tendon(s) of the rotator cuff of unspecified shoulder, initial encounter: Secondary | ICD-10-CM

## 2013-03-11 DIAGNOSIS — S43429A Sprain of unspecified rotator cuff capsule, initial encounter: Secondary | ICD-10-CM

## 2013-03-11 MED ORDER — NAPROXEN 500 MG PO TABS: 500.0000 mg | ORAL_TABLET | Freq: Two times a day (BID) | ORAL | Status: DC

## 2013-03-11 NOTE — Progress Notes (Signed)
Family Medicine Office Visit Note   Subjective:   Patient ID: Rachel Cortez, female  DOB: 1965-04-18, 48 y.o.. MRN: [id number]   Pt that comes today for same day appointment complaining of shoulder pain. She has been evaluated for rotator cuff strain with no improvement of symptoms. She  also reports pain on her left upper arm and wishes to pursue more evaluation or options for treatment regarding her condition. She states has done the exercises and treatment recommended without noticeable results.   She works as Environmental consultant in school and has noticed that pain exacerbates when she does minimal physical activity example to pull or push tables. Her pain improved only with rest during the past week when school was out due to snow.  Review of Systems:  Pt denies SOB, chest pain, palpitations, headaches, dizziness, numbness or weakness. No changes on urinary or BM habits. No unintentional weigh loss/gain.  Objective:   Physical Exam: Gen:  NAD HEENT: Moist mucous membranes  CV: Regular rate and rhythm, no murmurs rubs or gallops PULM: Clear to auscultation bilaterally. EXT: No edema or erythema. Empty can sign positive on the left. Mild tenderness on deltoid area. Normal contraction strength. Neurovascular intact.  Assessment & Plan:

## 2013-03-11 NOTE — Patient Instructions (Signed)
Rest and apply cold compresses intermittently as needed. You can continue using Flexeryl and the antiinflammatory has been changed to Naproxen. Please take it with food. As pain recedes, begin normal activities slowly as tolerated.  Call if symptoms persist. A referral for Sport Medicine have been placed Follow up as needed as needed

## 2013-03-13 NOTE — Assessment & Plan Note (Signed)
No improvement, signs and symptoms consistent with this condition but may benefit from imaging studies to be more assertive and evaluate options for tx. Pt referred to Sport Medicine. Continue with NSAIDs,muscular relaxant.

## 2013-03-17 ENCOUNTER — Encounter: Payer: Self-pay | Admitting: Family Medicine

## 2013-03-17 ENCOUNTER — Ambulatory Visit: Payer: Self-pay | Admitting: Family Medicine

## 2013-03-17 ENCOUNTER — Ambulatory Visit (INDEPENDENT_AMBULATORY_CARE_PROVIDER_SITE_OTHER): Payer: No Typology Code available for payment source | Admitting: Family Medicine

## 2013-03-17 VITALS — BP 129/84 | HR 71 | Ht 68.5 in | Wt 230.0 lb

## 2013-03-17 DIAGNOSIS — M67919 Unspecified disorder of synovium and tendon, unspecified shoulder: Secondary | ICD-10-CM

## 2013-03-17 DIAGNOSIS — M719 Bursopathy, unspecified: Secondary | ICD-10-CM

## 2013-03-17 DIAGNOSIS — M25519 Pain in unspecified shoulder: Secondary | ICD-10-CM

## 2013-03-17 DIAGNOSIS — M75102 Unspecified rotator cuff tear or rupture of left shoulder, not specified as traumatic: Secondary | ICD-10-CM

## 2013-03-17 DIAGNOSIS — M25512 Pain in left shoulder: Secondary | ICD-10-CM

## 2013-03-17 MED ORDER — METHYLPREDNISOLONE ACETATE 40 MG/ML IJ SUSP
40.0000 mg | Freq: Once | INTRAMUSCULAR | Status: AC
  Administered 2013-03-17: 40 mg via INTRA_ARTICULAR

## 2013-03-17 NOTE — Patient Instructions (Signed)
Thank you for coming in today  Injected shoulder today Continue ibuprofen 2-3 x per day Okay to do normal work. Try to avoid heavy overhead lifting Home exercises daily  Followup in 3 weeks

## 2013-03-17 NOTE — Progress Notes (Addendum)
CC: Left shoulder pain HPI: Patient presents with left shoulder and chest wall pain that has been present for about 2 months. She says that this started with what felt like a knot in her left trapezius. It then began to involve her lateral upper arm as well as her anterolateral chest wall. She has tried muscle relaxers and ibuprofen which do have help her temporarily. She thinks it may have been triggered by a body suit that was very tight around her chest wall and pulled down hard on her shoulder that she stop wearing a couple weeks ago now. She also think it may have been exacerbated by pushing and pulling in her job at [employer]. She states that she has had 4 different EKGs which were all reportedly normal. She denies any association of her chest wall pain with exertion and no associated symptoms such as dizziness, shortness of breath, diaphoresis, syncope. She does state that occasionally she'll get some tingling in her left fifth finger. However she denies any radicular pain down her arm. She does state she cannot when her side at night. Her pain is worse after work although she does not actually hurt during overhead movement. No pleuritic pain.  ROS: As above in the HPI. All other systems are stable or negative.  OBJECTIVE: APPEARANCE:  Patient in no acute distress.The patient appeared well nourished and normally developed. HEENT: No scleral icterus. Conjunctiva non-injected Resp: Non labored Skin: No rash MSK:  Left Shoulder - No swelling or deformity - No TTP over AC joint, biceps tendon. Mild tenderness at or lateral humerus at supraspinatus - FROM in flexion, abduction, internal, external rotation without pain - Strength 5/5 on shoulder abduction, internal rotation, external rotation, empty can. She does have pain with resisted empty can testing - Positive pain with empty can, Hawkin's - Neurovascularly intact  - No scapular winging on wall pushup - She is able to perform a wall  pushup as well as chest press without pain in the area of the pectoralis muscle Chest wall: - She has tenderness to palpation over the anterolateral chest wall near the bra line. This is the same pain as she is experiencing at her chest wall. Neck: Negative spurling's Full neck range of motion Grip strength and sensation normal in bilateral hands Strength good C4 to T1 distribution No sensory change to C4 to T1 Reflexes normal   MSK [place]: Not performed   ASSESSMENT: #1. Left shoulder pain, suspect rotator cuff syndrome and impingement #2. Left anterior lateral chest wall pain of unclear etiology. Favor musculoskeletal etiology given reproducibility of pain. Lack of associated cardiac symptoms, and negative EKGs.   PLAN: Discussed treatment options with the patient. We will start with subacromial injection today into the left shoulder for both diagnostic and therapeutic purposes. I will like to see how much pain relief she gets both of her shoulder pain as well as a chest wall pain with this injection. I do not suspect cervical etiology given her normal neck exam. She was also given a therapy and at home exercise program to perform for rotator cuff strengthening. I will see her back in 3 weeks for recheck.   Consent obtained and verified.  Time-out conducted.  Noted no overlying erythema, induration, or other signs of local infection.  Skin prepped in a sterile fashion.  Topical analgesic spray: Ethyl chloride.  Joint: Left subacromial Needle: 22-gauge 1-1/2 inch Completed without difficulty.  Meds: 4 cc 1% lidocaine, 1 cc depomedrol Advised to call if  fevers/chills, erythema, induration, drainage, or persistent bleeding.

## 2013-03-18 ENCOUNTER — Encounter: Payer: Self-pay | Admitting: Family Medicine

## 2013-04-03 ENCOUNTER — Telehealth: Payer: Self-pay | Admitting: Family Medicine

## 2013-04-03 NOTE — Telephone Encounter (Signed)
Patient was seen by [place] on 03/11/13. Was referred to [facility] and told that they would most likely order an ultrasound for her shoulder. This did not happen and patient now would like to have an ultrasound. Patient still having severe pain in shoulder and feelss "she is being treated like a [place] pig and would like some relief from all her pain". Please advise .Marland Kitchen

## 2013-04-03 NOTE — Telephone Encounter (Signed)
Pt needs to make an appointment with her PCP to discuss this since it has already been evaluated by [facility], no other further recommendations at this time.

## 2013-04-07 ENCOUNTER — Encounter: Payer: Self-pay | Admitting: Family Medicine

## 2013-04-07 ENCOUNTER — Ambulatory Visit (INDEPENDENT_AMBULATORY_CARE_PROVIDER_SITE_OTHER): Payer: No Typology Code available for payment source | Admitting: Family Medicine

## 2013-04-07 VITALS — BP 125/82 | HR 83 | Ht 68.5 in | Wt 230.0 lb

## 2013-04-07 DIAGNOSIS — R071 Chest pain on breathing: Secondary | ICD-10-CM

## 2013-04-07 DIAGNOSIS — R0789 Other chest pain: Secondary | ICD-10-CM

## 2013-04-07 NOTE — Patient Instructions (Signed)
Thank you for coming in today  I don't think that this pain is coming from your heart, lungs, or anything bad like a tumor I think this is muscular  Let's try physical therapy Try heat/ice on your chest  Followup 1 month

## 2013-04-07 NOTE — Progress Notes (Signed)
CC: Followup left shoulder pain HPI: Patient presents for followup of left shoulder pain. When I last saw her she was complaining of both left shoulder pain as well as left-sided chest wall pain. Recall that she has had 4 EKGs as well as a chest x-ray for workup of cardiac etiologies. She states that her shoulder and upper arm pain is completely better. However she is still having some pain at the left side of her chest. This starts in her axilla at the mid axillary line and will radiate across her left breast. This is not associated with exertion, shortness of breath, diaphoresis, syncope. It seems to be associated with lifting things such as her grandchild. She denies any rash. She does say this is been going on for 4 months it seems to be improving. She is tender to palpation over this area.  ROS: As above in the HPI. All other systems are stable or negative.  OBJECTIVE: APPEARANCE:  Patient in no acute distress.The patient appeared well nourished and normally developed. HEENT: No scleral icterus. Conjunctiva non-injected Resp: Non labored Skin: No rash MSK:  Left chest wall: She is tender approximately 5 cm distal to the axilla at the midaxillary line. There is no palpable adenopathy or soft tissue abnormalities. Palpation of the rib cage reproduces her pain. No rash  MSK [place]: Not performed   ASSESSMENT: #1. Left-sided chest wall pain of unclear etiology. I have very low suspicion for cardiac etiology given her negative chest x-ray and negative for EKG x4 as well as her atypical symptoms. She also does not give a history concerning for pulmonary etiology. Her reproducibility of her chest wall pain as well as exacerbation with lifting anything make this clear to be a musculoskeletal etiology. It is also reassuring that this does seem to be improving slowly  PLAN: We will get her into some physical therapy to see if we can hasten the improvement. She was offered reassurance. Trial of NSAID.  Followup in one month.

## 2013-04-21 ENCOUNTER — Ambulatory Visit: Payer: No Typology Code available for payment source | Admitting: Family Medicine

## 2013-04-29 ENCOUNTER — Ambulatory Visit: Payer: No Typology Code available for payment source | Admitting: Family Medicine

## 2013-05-04 ENCOUNTER — Ambulatory Visit: Payer: No Typology Code available for payment source | Admitting: Physical Therapy

## 2013-05-05 ENCOUNTER — Ambulatory Visit: Payer: No Typology Code available for payment source | Admitting: Family Medicine

## 2013-06-10 ENCOUNTER — Other Ambulatory Visit: Payer: Self-pay

## 2013-06-10 DIAGNOSIS — Z1231 Encounter for screening mammogram for malignant neoplasm of breast: Secondary | ICD-10-CM

## 2013-06-15 ENCOUNTER — Ambulatory Visit: Payer: No Typology Code available for payment source

## 2013-06-22 ENCOUNTER — Ambulatory Visit: Payer: No Typology Code available for payment source | Admitting: Family Medicine

## 2013-07-17 ENCOUNTER — Emergency Department (HOSPITAL_COMMUNITY)
Admission: EM | Admit: 2013-07-17 | Discharge: 2013-07-17 | Disposition: A | Discharge status: Home / Self Care | Payer: No Typology Code available for payment source | Source: Home / Self Care | Attending: Family Medicine | Admitting: Family Medicine

## 2013-07-17 ENCOUNTER — Emergency Department (HOSPITAL_COMMUNITY): Payer: No Typology Code available for payment source

## 2013-07-17 ENCOUNTER — Emergency Department (INDEPENDENT_AMBULATORY_CARE_PROVIDER_SITE_OTHER): Payer: No Typology Code available for payment source

## 2013-07-17 ENCOUNTER — Encounter (HOSPITAL_COMMUNITY): Payer: Self-pay | Admitting: Emergency Medicine

## 2013-07-17 DIAGNOSIS — IMO0002 Reserved for concepts with insufficient information to code with codable children: Secondary | ICD-10-CM

## 2013-07-17 DIAGNOSIS — S93609A Unspecified sprain of unspecified foot, initial encounter: Secondary | ICD-10-CM

## 2013-07-17 DIAGNOSIS — S93509A Unspecified sprain of unspecified toe(s), initial encounter: Secondary | ICD-10-CM

## 2013-07-17 NOTE — ED Provider Notes (Signed)
CSN: [id number]     Arrival date & time 07/17/13  1945 History   First MD Initiated Contact with Patient 07/17/13 2026     Chief Complaint  Patient presents with  . Toe Pain   (Consider location/radiation/quality/duration/timing/severity/associated sxs/prior Treatment) HPI Comments: Patient presents with right 2nd toe pain. She kicked the corner of a couch yesterday with immediate pain and swelling. This am, very painful with inability to move. No upper foot pain. Noted bruising.    Patient is a 48 y.o. female presenting with toe pain. The history is provided by the patient.  Toe Pain    Past Medical History  Diagnosis Date  . Migraine   . Obesity   . Allergy   . Osgood-Schlatter/osteochondroses   . Anemia   . Heart murmur    Past Surgical History  Procedure Laterality Date  . Tubal ligation     Family History  Problem Relation Age of Onset  . Diabetes Mother   . Hypertension Mother   . Hyperlipidemia Mother   . Hypertension Brother    History  Substance Use Topics  . Smoking status: Never Smoker   . Smokeless tobacco: Never Used  . Alcohol Use: No   OB History   Grav Para Term Preterm Abortions TAB SAB Ect Mult Living                 Review of Systems  All other systems reviewed and are negative.   Allergies  Codeine; Crab; and Penicillins  Home Medications   Prior to Admission medications   Medication Sig Start Date End Date Taking? Authorizing Provider  Fexofenadine HCl (ALLEGRA PO) Take by mouth.   Yes Historical Provider, MD  Nutritional Supplements (EQ ESTROBLEND MENOPAUSE PO) Take by mouth.   Yes Historical Provider, MD  Multiple Vitamin (MULTIVITAMIN) tablet Take 1 tablet by mouth daily.    Historical Provider, MD  naproxen (NAPROSYN) 500 MG tablet Take 1 tablet (500 mg total) by mouth 2 (two) times daily with a meal. 03/11/13   Dayarmys Piloto de Gwendalyn Ege, MD   BP 131/84  Pulse 66  Temp(Src) 98.3 F (36.8 C) (Oral)  Resp 12  SpO2 98%  LMP  07/12/2013 Physical Exam  Nursing note and vitals reviewed. Constitutional: She appears well-developed and well-nourished. No distress.  Musculoskeletal: She exhibits edema and tenderness.  Right 2nd toe with swelling at the base, ecchymosis, and pain with palpation of the IP joint. Remainder of foot exam norma.     ED Course  Procedures (including critical care time) Labs Review Labs Reviewed - No data to display  Imaging Review Dg Foot Complete Right  07/17/2013   CLINICAL DATA:  Third toe pain after toe injury 1 day ago.  EXAM: RIGHT FOOT COMPLETE - 3+ VIEW  COMPARISON:  None.  FINDINGS: There is no evidence of fracture or dislocation. There is no evidence of arthropathy or other focal bone abnormality. Soft tissues are unremarkable. Plantar and Achilles calcaneal spurs.  IMPRESSION: Negative.   Electronically Signed   By: Lucienne Capers M.D.   On: 07/17/2013 21:07     MDM   1. Toe sprain, initial encounter    Buddy tape, ice, elevate, hard sole shoe. 1-2 weeks. F/U if worsens. Motrin as needed.     Bjorn Pippin, PA-C 07/17/13 2112

## 2013-07-17 NOTE — Discharge Instructions (Signed)
Foot Sprain The muscles and cord like structures which attach muscle to bone (tendons) that surround the feet are made up of units. A foot sprain can occur at the weakest spot in any of these units. This condition is most often caused by injury to or overuse of the foot, as from playing contact sports, or aggravating a previous injury, or from poor conditioning, or obesity. SYMPTOMS  Pain with movement of the foot.  Tenderness and swelling at the injury site.  Loss of strength is present in moderate or severe sprains. THE THREE GRADES OR SEVERITY OF FOOT SPRAIN ARE:  Mild (Grade I): Slightly pulled muscle without tearing of muscle or tendon fibers or loss of strength.  Moderate (Grade II): Tearing of fibers in a muscle, tendon, or at the attachment to bone, with small decrease in strength.  Severe (Grade III): Rupture of the muscle-tendon-bone attachment, with separation of fibers. Severe sprain requires surgical repair. Often repeating (chronic) sprains are caused by overuse. Sudden (acute) sprains are caused by direct injury or over-use. DIAGNOSIS  Diagnosis of this condition is usually by your own observation. If problems continue, a caregiver may be required for further evaluation and treatment. X-rays may be required to make sure there are not breaks in the bones (fractures) present. Continued problems may require physical therapy for treatment. PREVENTION  Use strength and conditioning exercises appropriate for your sport.  Warm up properly prior to working out.  Use athletic shoes that are made for the sport you are participating in.  Allow adequate time for healing. Early return to activities makes repeat injury more likely, and can lead to an unstable arthritic foot that can result in prolonged disability. Mild sprains generally heal in 3 to 10 days, with moderate and severe sprains taking 2 to 10 weeks. Your caregiver can help you determine the proper time required for  healing. HOME CARE INSTRUCTIONS   Apply ice to the injury for 15-20 minutes, 03-04 times per day. Put the ice in a plastic bag and place a towel between the bag of ice and your skin.  An elastic wrap (like an Ace bandage) may be used to keep swelling down.  Keep foot above the level of the heart, or at least raised on a footstool, when swelling and pain are present.  Try to avoid use other than gentle range of motion while the foot is painful. Do not resume use until instructed by your caregiver. Then begin use gradually, not increasing use to the point of pain. If pain does develop, decrease use and continue the above measures, gradually increasing activities that do not cause discomfort, until you gradually achieve normal use.  Use crutches if and as instructed, and for the length of time instructed.  Keep injured foot and ankle wrapped between treatments.  Massage foot and ankle for comfort and to keep swelling down. Massage from the toes up towards the knee.  Only take over-the-counter or prescription medicines for pain, discomfort, or fever as directed by your caregiver. SEEK IMMEDIATE MEDICAL CARE IF:   Your pain and swelling increase, or pain is not controlled with medications.  You have loss of feeling in your foot or your foot turns cold or blue.  You develop new, unexplained symptoms, or an increase of the symptoms that brought you to your caregiver. MAKE SURE YOU:   Understand these instructions.  Will watch your condition.  Will get help right away if you are not doing well or get worse. Document Released:   06/16/2001 Document Revised: 03/19/2011 Document Reviewed: 08/14/2007 Exit[employer] Patient Information 2015 [employer]. This information is not intended to replace advice given to you by your health care provider. Make sure you discuss any questions you have with your health care provider.  

## 2013-07-17 NOTE — ED Notes (Signed)
Right middle toe pain, bruising, and inability to move/bend toe.  Onset yesterday after walking into leg of couch

## 2013-07-18 NOTE — ED Provider Notes (Signed)
Medical screening examination/treatment/procedure(s) were performed by non-physician practitioner and as supervising physician I was immediately available for consultation/collaboration.  Philipp Deputy, M.D.  Harden Mo, MD 07/18/13 [phone]

## 2013-07-31 ENCOUNTER — Encounter: Payer: Self-pay | Admitting: Family Medicine

## 2013-07-31 ENCOUNTER — Other Ambulatory Visit (HOSPITAL_COMMUNITY)
Admission: RE | Admit: 2013-07-31 | Discharge: 2013-07-31 | Disposition: A | Discharge status: Home / Self Care | Payer: No Typology Code available for payment source | Source: Ambulatory Visit | Attending: Family Medicine | Admitting: Family Medicine

## 2013-07-31 ENCOUNTER — Ambulatory Visit: Payer: No Typology Code available for payment source

## 2013-07-31 ENCOUNTER — Ambulatory Visit (INDEPENDENT_AMBULATORY_CARE_PROVIDER_SITE_OTHER): Payer: No Typology Code available for payment source | Admitting: Family Medicine

## 2013-07-31 VITALS — BP 139/85 | HR 107 | Temp 97.8°F | Ht 68.5 in | Wt 239.4 lb

## 2013-07-31 DIAGNOSIS — Z0000 Encounter for general adult medical examination without abnormal findings: Secondary | ICD-10-CM

## 2013-07-31 DIAGNOSIS — Z124 Encounter for screening for malignant neoplasm of cervix: Secondary | ICD-10-CM | POA: Insufficient documentation

## 2013-07-31 DIAGNOSIS — Z113 Encounter for screening for infections with a predominantly sexual mode of transmission: Secondary | ICD-10-CM

## 2013-07-31 DIAGNOSIS — R229 Localized swelling, mass and lump, unspecified: Secondary | ICD-10-CM

## 2013-07-31 DIAGNOSIS — N72 Inflammatory disease of cervix uteri: Secondary | ICD-10-CM

## 2013-07-31 DIAGNOSIS — R2241 Localized swelling, mass and lump, right lower limb: Secondary | ICD-10-CM | POA: Insufficient documentation

## 2013-07-31 DIAGNOSIS — Z1151 Encounter for screening for human papillomavirus (HPV): Secondary | ICD-10-CM | POA: Insufficient documentation

## 2013-07-31 NOTE — Assessment & Plan Note (Addendum)
Vascular soft-tissue or fluid filled mass on anterior shin, just below the knee, h/o osgood schlatter but not bony as might be expected. Reports blood drained from it in the past. - refer to sports med for further eval, ?ultrasound, ?drainage

## 2013-07-31 NOTE — Patient Instructions (Signed)
I will call you with the results of your lab tests.  Please schedule an appointment in our dermatology clinic on our way out for your chest mole and toenails.  Someone will call you about a sports medicine appointment for your leg.  Thank you.

## 2013-07-31 NOTE — Progress Notes (Signed)
   Subjective:    Patient ID: Rachel Cortez, female    DOB: December 21, 1965, 48 y.o.   MRN: [id number]  HPI Pt presents for annual exam  Reports she is gaining weight despite making diet changes. She has been eating lots of fruit, especially grapes. Discussed fruit is a good snack but in moderation. Has increased water and decreased soda. Reports eating lots of salads, discussed that at restaurants even the salads aren't usually very healthy and eating at home more may help. Also discussed that weight can vary significantly during the day and from one day to the next so sticking with changes longer will likely yield results.  Reports not getting significant exercise because of loose pit bulls in her neighborhood. She expressed desire to start again. We discussed the importance of tackling the problem from both sides.   Review of Systems  Constitutional: Negative for fever.  Respiratory: Negative for shortness of breath.   Cardiovascular: Negative for chest pain and palpitations.  Genitourinary: Positive for menstrual problem. Negative for vaginal discharge, genital sores, pelvic pain and dyspareunia.       Objective:   Physical Exam  Nursing note and vitals reviewed. Constitutional: She is oriented to person, place, and time. She appears well-developed and well-nourished. No distress.  HENT:  Head: Normocephalic and atraumatic.  Eyes: Conjunctivae are normal. Right eye exhibits no discharge. Left eye exhibits no discharge. No scleral icterus.  Neck: Normal range of motion. Neck supple. No thyromegaly present.  Cardiovascular: Normal rate, regular rhythm, normal heart sounds and intact distal pulses.   No murmur heard. Pulmonary/Chest: Effort normal and breath sounds normal. No respiratory distress. She has no wheezes.  Abdominal: Soft. Bowel sounds are normal. She exhibits no distension and no mass. There is no tenderness. There is no rebound and no guarding.  Genitourinary: Uterus  normal. No labial fusion. There is no rash, tenderness, lesion or injury on the right labia. There is no rash, tenderness, lesion or injury on the left labia. Cervix exhibits friability. Cervix exhibits no motion tenderness and no discharge. Right adnexum displays no mass, no tenderness and no fullness. Left adnexum displays no mass, no tenderness and no fullness. No erythema, tenderness or bleeding around the vagina. No foreign body around the vagina. No signs of injury around the vagina. No vaginal discharge found.    Lymphadenopathy:    She has no cervical adenopathy.  Neurological: She is alert and oriented to person, place, and time.  Skin: Skin is warm and dry. She is not diaphoretic.  Psychiatric: She has a normal mood and affect. Her behavior is normal.          Assessment & Plan:

## 2013-07-31 NOTE — Assessment & Plan Note (Signed)
Pap done today Talked extensively about weight loss and nutritional and physical activity changes she wants to make Patient had mammo scheduled today but cancelled, stressed importance of rescheduling and getting that done

## 2013-08-03 ENCOUNTER — Other Ambulatory Visit: Payer: No Typology Code available for payment source

## 2013-08-03 DIAGNOSIS — Z0000 Encounter for general adult medical examination without abnormal findings: Secondary | ICD-10-CM

## 2013-08-03 LAB — LIPID PANEL
CHOL/HDL RATIO: 4.4 ratio
Cholesterol: 214 mg/dL — ABNORMAL HIGH (ref 0–200)
HDL: 49 mg/dL (ref >39)
LDL CALC: 143 mg/dL — AB (ref 0–99)
TRIGLYCERIDES: 108 mg/dL (ref <150)
VLDL: 22 mg/dL (ref 0–40)

## 2013-08-03 NOTE — Progress Notes (Signed)
Drew fasting LIPID   BAJORDAN, MLS

## 2013-08-04 LAB — CYTOLOGY - PAP

## 2013-08-06 ENCOUNTER — Telehealth: Payer: Self-pay | Admitting: *Deleted

## 2013-08-06 NOTE — Telephone Encounter (Signed)
Message copied by [place] Pink on Thu Aug 06, 2013  1:55 PM ------      Message from: Frazier Richards      Created: Thu Aug 06, 2013  6:24 AM       Please inform patient that her pap smear and gonorrhea and chlamydia tests were negative. Her cholesterol was a little high but not at a level where she needs medication. She should limit fried foods and red meat and increase the fiber in her diet. Thanks! ------

## 2013-08-06 NOTE — Telephone Encounter (Signed)
Message given to patient.Rachel Cortez, [name]

## 2013-08-07 ENCOUNTER — Encounter: Payer: Self-pay | Admitting: Sports Medicine

## 2013-08-07 ENCOUNTER — Ambulatory Visit (INDEPENDENT_AMBULATORY_CARE_PROVIDER_SITE_OTHER): Payer: No Typology Code available for payment source | Admitting: Sports Medicine

## 2013-08-07 ENCOUNTER — Other Ambulatory Visit: Payer: No Typology Code available for payment source

## 2013-08-07 VITALS — BP 123/78 | HR 66 | Ht 68.0 in | Wt 233.0 lb

## 2013-08-07 DIAGNOSIS — R2241 Localized swelling, mass and lump, right lower limb: Secondary | ICD-10-CM

## 2013-08-07 DIAGNOSIS — R229 Localized swelling, mass and lump, unspecified: Secondary | ICD-10-CM

## 2013-08-07 NOTE — Assessment & Plan Note (Addendum)
Unclear etiology at this time. Appears benign especially given duration. Question adventitial bursa. To further evaluate, will obtain MRI of the knee with and without contrast. Will call patient following MRI results.  Followup pending results.

## 2013-08-07 NOTE — Progress Notes (Signed)
  Rachel Cortez - 48 y.o. female MRN [id number]  Date of birth: 03-15-1965    SUBJECTIVE:     48 year old female presents for evaluation of soft tissue mass.  Patient reports that she has had this "area" since the age of 75.  She reports that she thought this was secondary to [employer].  She has even had this area drained previously when she was younger.  This area has continued to persist and is of concern to her.  Area is tender and painful when any additional pressures applied (i.e. being on her hands and knees, praying at the alter at church, etc).  No interventions tried. No relieving factors.  She denies any recent fevers, chills, night sweats, loss of appetite, weight changes.  She denies any associated knee pain or discomfort.  ROS:    Per HPI  PERTINENT  PMH / PSH / SH:  Past Medical, Surgical, Social Reviewed. Pertinent Historical Findings include: Hx of Osgood Schlatter; Has had area of concern drained previously.  OBJECTIVE: BP 123/78  Pulse 66  Ht 5\' 8"  (1.727 m)  Wt 233 lb (105.688 kg)  BMI 35.44 kg/m2  LMP 07/12/2013  Physical Exam:  Vital signs are reviewed. General: Well-appearing female in no acute distress. MSK: Right knee/Proximal tibia Inspection - soft tissue mass noted just distal to the tibial tubercle. No other abnormalities noted. Palpation - Mass is fluctuant; no surrounding erythema noted.  ROM full in flexion and extension and lower leg rotation. Ligaments with solid consistent endpoints including ACL, PCL, LCL, MCL. Patellar and quadriceps tendons unremarkable.  MSK [place] - Limited Right Knee - Soft tissue mass - appears solid.   - Minimal hypoechoic changes noted - No calcification or other abnormalities noted. - Spurring at the tibial tubercle reflective of prior [employer].  ASSESSMENT & PLAN: See problem based charting.

## 2013-08-17 ENCOUNTER — Encounter (HOSPITAL_COMMUNITY): Payer: Self-pay | Admitting: Emergency Medicine

## 2013-08-17 ENCOUNTER — Emergency Department (HOSPITAL_COMMUNITY)
Admission: EM | Admit: 2013-08-17 | Discharge: 2013-08-17 | Disposition: A | Discharge status: Home / Self Care | Payer: No Typology Code available for payment source | Source: Home / Self Care | Attending: Family Medicine | Admitting: Family Medicine

## 2013-08-17 ENCOUNTER — Emergency Department (INDEPENDENT_AMBULATORY_CARE_PROVIDER_SITE_OTHER): Payer: No Typology Code available for payment source

## 2013-08-17 DIAGNOSIS — X58XXXA Exposure to other specified factors, initial encounter: Secondary | ICD-10-CM

## 2013-08-17 DIAGNOSIS — S90129A Contusion of unspecified lesser toe(s) without damage to nail, initial encounter: Secondary | ICD-10-CM

## 2013-08-17 DIAGNOSIS — S90121D Contusion of right lesser toe(s) without damage to nail, subsequent encounter: Secondary | ICD-10-CM

## 2013-08-17 NOTE — ED Notes (Signed)
Right middle toe pain, has stumped toe frequently over the past few months.  Toe painful, sore, and does not bend like normally

## 2013-08-17 NOTE — Discharge Instructions (Signed)
Warm soak toe as needed for swelling and soreness, activity as tolerated.

## 2013-08-17 NOTE — ED Provider Notes (Signed)
CSN: [id number]     Arrival date & time 08/17/13  67 History   First MD Initiated Contact with Patient 08/17/13 1700     Chief Complaint  Patient presents with  . Toe Pain   (Consider location/radiation/quality/duration/timing/severity/associated sxs/prior Treatment) Patient is a 48 y.o. female presenting with toe pain. The history is provided by the patient.  Toe Pain This is a recurrent problem. The current episode started more than 1 week ago. The problem has not changed since onset.The symptoms are aggravated by bending.    Past Medical History  Diagnosis Date  . Migraine   . Obesity   . Allergy   . Osgood-Schlatter/osteochondroses   . Anemia   . Heart murmur    Past Surgical History  Procedure Laterality Date  . Tubal ligation     Family History  Problem Relation Age of Onset  . Diabetes Mother   . Hypertension Mother   . Hyperlipidemia Mother   . Hypertension Brother    History  Substance Use Topics  . Smoking status: Never Smoker   . Smokeless tobacco: Never Used  . Alcohol Use: No   OB History   Grav Para Term Preterm Abortions TAB SAB Ect Mult Living                 Review of Systems  Constitutional: Negative.   Musculoskeletal: Positive for joint swelling.  Skin: Positive for color change.    Allergies  Codeine; Crab; and Penicillins  Home Medications   Prior to Admission medications   Medication Sig Start Date End Date Taking? Authorizing Provider  Fexofenadine HCl (ALLEGRA PO) Take by mouth.    Historical Provider, MD  Multiple Vitamin (MULTIVITAMIN) tablet Take 1 tablet by mouth daily.    Historical Provider, MD  Nutritional Supplements (EQ ESTROBLEND MENOPAUSE PO) Take by mouth.    Historical Provider, MD   BP 128/74  Pulse 68  Temp(Src) 98.3 F (36.8 C) (Oral)  Resp 16  SpO2 100%  LMP 08/08/2013 Physical Exam  Nursing note and vitals reviewed. Constitutional: She is oriented to person, place, and time. She appears well-developed  and well-nourished.  Musculoskeletal: She exhibits tenderness.  Sl ecchymosis and tenderness to right 3rd toe, sl sts , other toes intact.  Neurological: She is alert and oriented to person, place, and time.  Skin: Skin is warm and dry.    ED Course  Procedures (including critical care time) Labs Review Labs Reviewed - No data to display  Imaging Review Dg Foot Complete Right  08/17/2013   CLINICAL DATA:  Right third toe injury.  EXAM: RIGHT FOOT COMPLETE - 3+ VIEW  COMPARISON:  07/17/2013  FINDINGS: No acute bony abnormality. Specifically, no fracture, subluxation, or dislocation. Soft tissues are intact. Plantar and posterior calcaneal spurs, stable.  IMPRESSION: No acute bony abnormality.   Electronically Signed   By: Rolm Baptise M.D.   On: 08/17/2013 16:44     MDM   1. Contusion, toe, right, subsequent encounter        Billy Fischer, MD 08/17/13 1705

## 2013-08-19 ENCOUNTER — Ambulatory Visit (INDEPENDENT_AMBULATORY_CARE_PROVIDER_SITE_OTHER): Payer: No Typology Code available for payment source | Admitting: Family Medicine

## 2013-08-19 ENCOUNTER — Ambulatory Visit: Payer: No Typology Code available for payment source | Admitting: Family Medicine

## 2013-08-19 ENCOUNTER — Encounter: Payer: Self-pay | Admitting: Family Medicine

## 2013-08-19 VITALS — BP 118/72 | HR 68 | Temp 98.4°F | Resp 16 | Ht 68.5 in | Wt 233.0 lb

## 2013-08-19 DIAGNOSIS — D239 Other benign neoplasm of skin, unspecified: Secondary | ICD-10-CM

## 2013-08-19 DIAGNOSIS — B351 Tinea unguium: Secondary | ICD-10-CM | POA: Insufficient documentation

## 2013-08-19 DIAGNOSIS — D229 Melanocytic nevi, unspecified: Secondary | ICD-10-CM | POA: Insufficient documentation

## 2013-08-19 MED ORDER — TERBINAFINE HCL 250 MG PO TABS: 250.0000 mg | ORAL_TABLET | Freq: Every day | ORAL | Status: DC

## 2013-08-19 NOTE — Assessment & Plan Note (Signed)
7x76mm, blue nevus vs dermatofibroma, unlikely melanoma - shave biopsy performed today given pain and change - will call patient with results

## 2013-08-19 NOTE — Addendum Note (Signed)
Addended by: Frazier Richards on: 08/19/2013 03:40 PM   Modules accepted: Orders

## 2013-08-19 NOTE — Progress Notes (Signed)
   Subjective:    Patient ID: Randall An, female    DOB: 1965/12/02, 48 y.o.   MRN: [id number]  HPI Patient presents for lesion of chest that has been present for many years but has recently started to hurt. She thinks it may have gotten a little bigger and bluer over the past few weeks.  She also presents for her toenails which are think and yellow and sore. They have been this way for many years. She has tried topical and by mouth treatments in the past without significant improvement.  Review of Systems See HPI    Objective:   Physical Exam  Nursing note and vitals reviewed. Constitutional: She is oriented to person, place, and time. She appears well-developed and well-nourished.  HENT:  Head: Normocephalic and atraumatic.  Eyes: Conjunctivae are normal. Right eye exhibits no discharge. Left eye exhibits no discharge. No scleral icterus.  Cardiovascular: Normal rate.   Pulmonary/Chest: Effort normal.  Neurological: She is alert and oriented to person, place, and time.  Skin: Skin is warm, dry and intact. Lesion noted. No abrasion, no bruising, no burn, no ecchymosis, no laceration, no petechiae and no rash noted. She is not diaphoretic. No erythema. No pallor.     Thickening and yellowing of bilateral great toes.  Psychiatric: She has a normal mood and affect. Her behavior is normal.          Assessment & Plan:

## 2013-08-19 NOTE — Patient Instructions (Signed)
Keep your wound clean and dry. Cover it with a neosporin or vaseline and a bandage until it heals completely. I will call you with your biopsy results.  For your nails I am prescribing lamisil. Please come back in 3 months for [place] to check your liver function.

## 2013-08-19 NOTE — Assessment & Plan Note (Signed)
Present for many years, failed treatment with orals and topicals in the past - lamisil for 12 weeks then f/u

## 2013-08-26 ENCOUNTER — Encounter: Payer: Self-pay | Admitting: Family Medicine

## 2013-08-26 ENCOUNTER — Other Ambulatory Visit: Payer: Self-pay | Admitting: Family Medicine

## 2013-09-08 ENCOUNTER — Ambulatory Visit (INDEPENDENT_AMBULATORY_CARE_PROVIDER_SITE_OTHER): Payer: No Typology Code available for payment source | Admitting: Family Medicine

## 2013-09-08 ENCOUNTER — Encounter: Payer: Self-pay | Admitting: Family Medicine

## 2013-09-08 VITALS — BP 122/62 | HR 77 | Temp 98.2°F | Ht 68.5 in | Wt 238.0 lb

## 2013-09-08 DIAGNOSIS — Z111 Encounter for screening for respiratory tuberculosis: Secondary | ICD-10-CM

## 2013-09-08 DIAGNOSIS — Z0000 Encounter for general adult medical examination without abnormal findings: Secondary | ICD-10-CM

## 2013-09-08 NOTE — Addendum Note (Signed)
Addended by: Levert Feinstein F on: 09/08/2013 02:59 PM   Modules accepted: Orders

## 2013-09-08 NOTE — Progress Notes (Signed)
   Subjective:    Patient ID: Rachel Cortez, female    DOB: Jul 16, 1965, 48 y.o.   MRN: [id number]  HPI Pt presents for PPD and work physical form completion.  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 She finds it very tiring at times but gets good exercise that helps her stay healthy and she enjoys the kids very much. This year she will be with toddlers and 2s which is younger than in previous years.   She has no foreign travel history, homelessness, cough, fevers or night sweats.   Review of Systems See HPI    Objective:   Physical Exam  Nursing note and vitals reviewed. Constitutional: She is oriented to person, place, and time. She appears well-developed and well-nourished. No distress.  HENT:  Head: Normocephalic and atraumatic.  Eyes: Conjunctivae and EOM are normal. Pupils are equal, round, and reactive to light. Right eye exhibits no discharge. Left eye exhibits no discharge. No scleral icterus.  Cardiovascular: Normal rate, regular rhythm and normal heart sounds.   No murmur heard. Pulmonary/Chest: Effort normal and breath sounds normal. No respiratory distress. She has no wheezes.  Neurological: She is alert and oriented to person, place, and time.  Skin: Skin is warm and dry. No rash noted. She is not diaphoretic.  Psychiatric: She has a normal mood and affect. Her behavior is normal.          Assessment & Plan:

## 2013-09-08 NOTE — Patient Instructions (Signed)
Thank you for coming in today. Good luck with the kids! Let me know if you need anything.

## 2013-09-08 NOTE — Assessment & Plan Note (Signed)
UTD on health maintenance Work PE form completed today PPD placed today

## 2013-09-10 ENCOUNTER — Ambulatory Visit (INDEPENDENT_AMBULATORY_CARE_PROVIDER_SITE_OTHER): Payer: No Typology Code available for payment source | Admitting: *Deleted

## 2013-09-10 DIAGNOSIS — Z111 Encounter for screening for respiratory tuberculosis: Secondary | ICD-10-CM

## 2013-09-10 LAB — TB SKIN TEST
Induration: 0 mm
TB Skin Test: NEGATIVE

## 2013-09-10 NOTE — Progress Notes (Signed)
      PPD Reading Note PPD read and results entered in [employer]Care. Result: 0 mm induration. Interpretation: Negative Krisinda Giovanni L, RN  

## 2013-09-28 ENCOUNTER — Ambulatory Visit: Payer: Self-pay

## 2013-10-01 ENCOUNTER — Encounter: Payer: Self-pay | Admitting: Family Medicine

## 2013-10-01 NOTE — Progress Notes (Unsigned)
Pt dropped off form to be filled out regarding Division of social service medical evaluation, best contact number [phone].

## 2013-10-01 NOTE — Progress Notes (Unsigned)
Placed in MDs box to be filled out. Prabhleen Montemayor CMA 

## 2013-10-02 ENCOUNTER — Telehealth: Payer: Self-pay | Admitting: *Deleted

## 2013-10-02 NOTE — Telephone Encounter (Signed)
LMOVM for pt to call us back. Form ready for her to pick up and all she needs to do is fill out the questions on the front of form. Blount, Deseree C

## 2013-10-23 ENCOUNTER — Other Ambulatory Visit: Payer: Self-pay

## 2014-04-08 ENCOUNTER — Encounter: Payer: Self-pay | Admitting: *Deleted

## 2014-04-27 ENCOUNTER — Ambulatory Visit: Payer: 59 | Admitting: Family Medicine

## 2014-04-30 ENCOUNTER — Ambulatory Visit (INDEPENDENT_AMBULATORY_CARE_PROVIDER_SITE_OTHER): Payer: 59 | Admitting: Family Medicine

## 2014-04-30 ENCOUNTER — Encounter: Payer: Self-pay | Admitting: Family Medicine

## 2014-04-30 VITALS — BP 124/70 | HR 72 | Temp 98.4°F | Resp 14 | Ht 68.0 in | Wt 241.0 lb

## 2014-04-30 DIAGNOSIS — D509 Iron deficiency anemia, unspecified: Secondary | ICD-10-CM | POA: Diagnosis not present

## 2014-04-30 DIAGNOSIS — Z0000 Encounter for general adult medical examination without abnormal findings: Secondary | ICD-10-CM

## 2014-04-30 DIAGNOSIS — E669 Obesity, unspecified: Secondary | ICD-10-CM

## 2014-04-30 DIAGNOSIS — Z1321 Encounter for screening for nutritional disorder: Secondary | ICD-10-CM | POA: Diagnosis not present

## 2014-04-30 DIAGNOSIS — N951 Menopausal and female climacteric states: Secondary | ICD-10-CM | POA: Diagnosis not present

## 2014-04-30 DIAGNOSIS — E7800 Pure hypercholesterolemia, unspecified: Secondary | ICD-10-CM

## 2014-04-30 DIAGNOSIS — E611 Iron deficiency: Secondary | ICD-10-CM

## 2014-04-30 LAB — CBC WITH DIFFERENTIAL/PLATELET
Basophils Absolute: 0 10*3/uL (ref 0.0–0.1)
Basophils Relative: 0 % (ref 0–1)
Eosinophils Absolute: 0.1 10*3/uL (ref 0.0–0.7)
Eosinophils Relative: 1 % (ref 0–5)
HCT: 36.6 % (ref 36.0–46.0)
Hemoglobin: 11.8 g/dL — ABNORMAL LOW (ref 12.0–15.0)
LYMPHS ABS: 2.5 10*3/uL (ref 0.7–4.0)
LYMPHS PCT: 25 % (ref 12–46)
MCH: 25.2 pg — AB (ref 26.0–34.0)
MCHC: 32.2 g/dL (ref 30.0–36.0)
MCV: 78 fL (ref 78.0–100.0)
MPV: 9.9 fL (ref 8.6–12.4)
Monocytes Absolute: 0.6 10*3/uL (ref 0.1–1.0)
Monocytes Relative: 6 % (ref 3–12)
NEUTROS ABS: 6.8 10*3/uL (ref 1.7–7.7)
NEUTROS PCT: 68 % (ref 43–77)
PLATELETS: 304 10*3/uL (ref 150–400)
RBC: 4.69 MIL/uL (ref 3.87–5.11)
RDW: 14.9 % (ref 11.5–15.5)
WBC: 10 10*3/uL (ref 4.0–10.5)

## 2014-04-30 LAB — COMPREHENSIVE METABOLIC PANEL
ALBUMIN: 3.5 g/dL (ref 3.5–5.2)
ALT: 19 U/L (ref 0–35)
AST: 20 U/L (ref 0–37)
Alkaline Phosphatase: 74 U/L (ref 39–117)
BUN: 8 mg/dL (ref 6–23)
CALCIUM: 8.7 mg/dL (ref 8.4–10.5)
CHLORIDE: 106 meq/L (ref 96–112)
CO2: 24 meq/L (ref 19–32)
Creat: 0.79 mg/dL (ref 0.50–1.10)
GLUCOSE: 84 mg/dL (ref 70–99)
POTASSIUM: 4.1 meq/L (ref 3.5–5.3)
SODIUM: 138 meq/L (ref 135–145)
TOTAL PROTEIN: 6.3 g/dL (ref 6.0–8.3)
Total Bilirubin: 0.5 mg/dL (ref 0.2–1.2)

## 2014-04-30 LAB — LIPID PANEL
CHOL/HDL RATIO: 5.2 ratio
Cholesterol: 212 mg/dL — ABNORMAL HIGH (ref 0–200)
HDL: 41 mg/dL — ABNORMAL LOW (ref >=46)
LDL Cholesterol: 146 mg/dL — ABNORMAL HIGH (ref 0–99)
TRIGLYCERIDES: 127 mg/dL (ref <150)
VLDL: 25 mg/dL (ref 0–40)

## 2014-04-30 NOTE — Patient Instructions (Addendum)
Work on weight loss, low calorie diet  We will call with lab results Schedule mammogram We will call with lab results F/U 6 months or as needed

## 2014-05-01 LAB — TSH: TSH: 0.944 u[IU]/mL (ref 0.350–4.500)

## 2014-05-01 LAB — VITAMIN D 25 HYDROXY (VIT D DEFICIENCY, FRACTURES): VIT D 25 HYDROXY: 30 ng/mL (ref 30–100)

## 2014-05-01 LAB — FSH/LH
FSH: 9 m[IU]/mL
LH: 4.1 m[IU]/mL

## 2014-05-02 ENCOUNTER — Encounter: Payer: Self-pay | Admitting: Family Medicine

## 2014-05-02 NOTE — Assessment & Plan Note (Signed)
History of iron def anemia, recheck levels and CBC

## 2014-05-02 NOTE — Assessment & Plan Note (Signed)
Recheck cholesterol

## 2014-05-02 NOTE — Assessment & Plan Note (Signed)
Discussed nutrition and exercise Declined nutritionist at this time

## 2014-05-02 NOTE — Progress Notes (Addendum)
Patient ID: Rachel Cortez, female   DOB: 1965/12/03, 49 y.o.   MRN: [id number]   Subjective:    Patient ID: Rachel Cortez, female    DOB: 12-23-1965, 49 y.o.   MRN: [id number]  Patient presents for New Patient -Establish Care  Pt here for CPE and to re-establish care, recently changed insurances. Was seen by [facility]. PAP Smear UTD, due for fasting labs, Mammogram. Menses has been very irregular, she has been taking Estroven vitamins for hot flashes. She has not been diligent with diet past year and has gained weight, she is also not exercising. She also recently started a new job. This AM she weighed in at 239lbs.  Only active medication is allergy meds   Review Of Systems:  GEN- denies fatigue, fever, weight loss,weakness, recent illness HEENT- denies eye drainage, change in vision, nasal discharge, CVS- denies chest pain, palpitations RESP- denies SOB, cough, wheeze ABD- denies N/V, change in stools, abd pain GU- denies dysuria, hematuria, dribbling, incontinence MSK- denies joint pain, muscle aches, injury Neuro- denies headache, dizziness, syncope, seizure activity       Objective:    BP 124/70 mmHg  Pulse 72  Temp(Src) 98.4 F (36.9 C) (Oral)  Resp 14  Ht 5\' 8"  (1.727 m)  Wt 241 lb (109.317 kg)  BMI 36.65 kg/m2  LMP 04/18/2014 (Approximate) GEN- NAD, alert and oriented x3,obese HEENT- PERRL, EOMI, non injected sclera, pink conjunctiva, MMM, oropharynx clear Neck- Supple, no thyromegaly CVS- RRR, 2/6 SEM RESP-CTAB ABD-NABS,soft,NT,ND Psych- normal affect and mood EXT- No edema Pulses- Radial, DP- 2+        Assessment & Plan:      Problem List Items Addressed This Visit    Obesity    Discussed nutrition and exercise Declined nutritionist at this time      Low iron    History of iron def anemia, recheck levels and CBC      HYPERCHOLESTEROLEMIA - Primary    Recheck cholesterol      Relevant Orders   CBC with  Differential/Platelet (Completed)   Comprehensive metabolic panel (Completed)   Lipid panel (Completed)    Other Visit Diagnoses    Routine general medical examination at a health care facility        CPE done, PAP Smear UTD, Mammogram will be scheduled by patient, immunizations UTD, will obtain hormone labs for her irregular menses    Perimenopausal        Relevant Orders    FSH/LH (Completed)    TSH (Completed)    Encounter for vitamin deficiency screening        Relevant Orders    Vitamin D, 25-hydroxy (Completed)       Note: This dictation was prepared with Dragon dictation along with smaller phrase technology. Any transcriptional errors that result from this process are unintentional.

## 2014-05-03 ENCOUNTER — Ambulatory Visit
Admission: RE | Admit: 2014-05-03 | Discharge: 2014-05-03 | Disposition: A | Discharge status: Home / Self Care | Payer: 59 | Source: Ambulatory Visit

## 2014-05-03 DIAGNOSIS — Z1231 Encounter for screening mammogram for malignant neoplasm of breast: Secondary | ICD-10-CM

## 2014-06-09 ENCOUNTER — Emergency Department (HOSPITAL_COMMUNITY): Payer: 59

## 2014-06-09 ENCOUNTER — Emergency Department (HOSPITAL_COMMUNITY)
Admission: EM | Admit: 2014-06-09 | Discharge: 2014-06-10 | Disposition: A | Discharge status: Home / Self Care | Payer: 59 | Attending: Emergency Medicine | Admitting: Emergency Medicine

## 2014-06-09 ENCOUNTER — Encounter (HOSPITAL_COMMUNITY): Payer: Self-pay | Admitting: Emergency Medicine

## 2014-06-09 DIAGNOSIS — Y999 Unspecified external cause status: Secondary | ICD-10-CM | POA: Insufficient documentation

## 2014-06-09 DIAGNOSIS — Z862 Personal history of diseases of the blood and blood-forming organs and certain disorders involving the immune mechanism: Secondary | ICD-10-CM | POA: Insufficient documentation

## 2014-06-09 DIAGNOSIS — M25572 Pain in left ankle and joints of left foot: Secondary | ICD-10-CM

## 2014-06-09 DIAGNOSIS — R011 Cardiac murmur, unspecified: Secondary | ICD-10-CM | POA: Diagnosis not present

## 2014-06-09 DIAGNOSIS — Y939 Activity, unspecified: Secondary | ICD-10-CM | POA: Diagnosis not present

## 2014-06-09 DIAGNOSIS — Z880 Allergy status to penicillin: Secondary | ICD-10-CM | POA: Diagnosis not present

## 2014-06-09 DIAGNOSIS — E669 Obesity, unspecified: Secondary | ICD-10-CM | POA: Insufficient documentation

## 2014-06-09 DIAGNOSIS — S9032XA Contusion of left foot, initial encounter: Secondary | ICD-10-CM | POA: Insufficient documentation

## 2014-06-09 DIAGNOSIS — W208XXA Other cause of strike by thrown, projected or falling object, initial encounter: Secondary | ICD-10-CM | POA: Insufficient documentation

## 2014-06-09 DIAGNOSIS — T148XXA Other injury of unspecified body region, initial encounter: Secondary | ICD-10-CM

## 2014-06-09 DIAGNOSIS — Z8679 Personal history of other diseases of the circulatory system: Secondary | ICD-10-CM | POA: Diagnosis not present

## 2014-06-09 DIAGNOSIS — T148 Other injury of unspecified body region: Secondary | ICD-10-CM | POA: Insufficient documentation

## 2014-06-09 DIAGNOSIS — Y929 Unspecified place or not applicable: Secondary | ICD-10-CM | POA: Insufficient documentation

## 2014-06-09 DIAGNOSIS — Z8739 Personal history of other diseases of the musculoskeletal system and connective tissue: Secondary | ICD-10-CM | POA: Diagnosis not present

## 2014-06-09 DIAGNOSIS — S99922A Unspecified injury of left foot, initial encounter: Secondary | ICD-10-CM | POA: Diagnosis present

## 2014-06-09 DIAGNOSIS — S9002XA Contusion of left ankle, initial encounter: Secondary | ICD-10-CM | POA: Insufficient documentation

## 2014-06-09 MED ORDER — NAPROXEN 500 MG PO TABS: 500.0000 mg | ORAL_TABLET | Freq: Two times a day (BID) | ORAL | Status: DC | PRN

## 2014-06-09 MED ORDER — TRAMADOL HCL 50 MG PO TABS: 50.0000 mg | ORAL_TABLET | Freq: Four times a day (QID) | ORAL | Status: DC | PRN

## 2014-06-09 NOTE — ED Notes (Signed)
Pt. accidentally dropped a large can of baked beans at left foot this afternoon , presents with pain / swelling at left foot / ambulatory.

## 2014-06-09 NOTE — ED Notes (Signed)
Patient changed into paper scrubs and demonstrated use of crutches.

## 2014-06-09 NOTE — ED Provider Notes (Signed)
CSN: [id number]     Arrival date & time 06/09/14  2157 History  This chart was scribed for [employer], PA-C, working with Fredia Sorrow, MD by Steva Colder, ED Scribe. The patient was seen in room TR03C/TR03C at 10:52 PM.    Chief Complaint  Patient presents with  . Foot Injury      Patient is a 49 y.o. female presenting with foot injury. The history is provided by the patient. No language interpreter was used.  Foot Injury Location:  Foot Time since incident:  8 hours Injury: yes   Mechanism of injury comment:  Accident-- can of beans fell onto foot Foot location:  L foot Pain details:    Quality:  Aching and throbbing   Radiates to:  Does not radiate   Severity:  Moderate   Onset quality:  Gradual   Duration:  8 hours   Timing:  Constant   Progression:  Unchanged Chronicity:  New Dislocation: no   Foreign body present:  No foreign bodies Prior injury to area:  No Relieved by:  None tried Worsened by:  Bearing weight and activity Ineffective treatments:  None tried Associated symptoms: decreased ROM (due to pain), swelling and tingling   Associated symptoms: no muscle weakness and no numbness   Risk factors: no frequent fractures      Pier Laux is a 49 y.o. female with a medical hx of migraine, obesity, and anemia, who presents to the Emergency department complaining of left foot injury onset 2:30 PM. Pt reports that the can of baked beans fell out of her shopping cart onto her left foot/lateral ankle. Pt reports that she has tingling in her L 2nd-5th toes. Pt states her ankle pain is 10/10, constant, aching and throbbing, and it does not radiate from ankle and foot. She states that bearing weight worsens her pain. She states that she has not tried any medications for the relief of her symptoms. She states that she is having associated symptoms of ankle swelling, bruising, decreased ROM due to pain, and tingling in toes.  She denies numbness, weakness, left  knee pain, left calf pain, hitting head, fall, skin injuries, CP, SOB, and any other symptoms. Denies prior injury to this foot.  Past Medical History  Diagnosis Date  . Migraine   . Obesity   . Osgood-Schlatter/osteochondroses   . Anemia   . Heart murmur   . Allergy     SEASONAL   Past Surgical History  Procedure Laterality Date  . Tubal ligation     Family History  Problem Relation Age of Onset  . Diabetes Mother   . Hypertension Mother   . Hyperlipidemia Mother   . Hypertension Brother   . Hyperlipidemia Brother   . Diabetes Maternal Grandmother    History  Substance Use Topics  . Smoking status: Never Smoker   . Smokeless tobacco: Never Used  . Alcohol Use: No   OB History    No data available     Review of Systems  Respiratory: Negative for shortness of breath.   Cardiovascular: Negative for chest pain.  Musculoskeletal: Positive for joint swelling and arthralgias (left foot and ankle).  Skin: Positive for color change. Negative for wound.  Allergic/Immunologic: Negative for immunocompromised state.  Neurological: Negative for weakness and numbness.       Tingling in left toes  Hematological: Does not bruise/bleed easily.    A complete 10 system review of systems was obtained and all systems are negative except as  noted in the HPI and PMH.    Allergies  Codeine; Crab; and Penicillins  Home Medications   Prior to Admission medications   Medication Sig Start Date End Date Taking? Authorizing Provider  Fexofenadine HCl (ALLEGRA PO) Take by mouth.    Historical Provider, MD  Multiple Vitamin (MULTIVITAMIN) tablet Take 1 tablet by mouth daily.    Historical Provider, MD  Nutritional Supplements (EQ ESTROBLEND MENOPAUSE PO) Take by mouth.    Historical Provider, MD   BP 156/80 mmHg  Pulse 68  Temp(Src) 98.7 F (37.1 C) (Oral)  Resp 14  SpO2 99%  LMP 06/06/2014 Physical Exam  Constitutional: She is oriented to person, place, and time. Vital signs are  normal. She appears well-developed and well-nourished.  Non-toxic appearance. No distress.  Afebrile, nontoxic, NAD  HENT:  Head: Normocephalic and atraumatic.  Mouth/Throat: Mucous membranes are normal.  Eyes: Conjunctivae and EOM are normal. Right eye exhibits no discharge. Left eye exhibits no discharge.  Neck: Normal range of motion. Neck supple.  Cardiovascular: Normal rate and intact distal pulses.   Pulmonary/Chest: Effort normal. No respiratory distress.  Abdominal: Normal appearance. She exhibits no distension.  Musculoskeletal:       Left ankle: She exhibits decreased range of motion (due to pain), swelling and ecchymosis. She exhibits no deformity, no laceration and normal pulse. Tenderness. Lateral malleolus tenderness found. Achilles tendon normal.       Feet:  Left ankle with limited ROM due to pain, mild swelling to latera aspect, without deformity, with TTP of lateral malleolus but no TTP or swelling of fore foot or calf. No break in skin. Mild bruising without erythema. No warmth. Achilles intact. Good pedal pulse and cap refill of all toes. Wiggling toes without difficulty. Sensation grossly intact.   Neurological: She is alert and oriented to person, place, and time. She has normal strength. No sensory deficit.  Skin: Skin is warm, dry and intact. No rash noted.  Psychiatric: She has a normal mood and affect. Her behavior is normal.  Nursing note and vitals reviewed.   ED Course  Procedures (including critical care time) DIAGNOSTIC STUDIES: Oxygen Saturation is 99% on RA, nl by my interpretation.    COORDINATION OF CARE: 10:59 PM-Discussed treatment plan which includes left foot and left ankle X-ray with pt at bedside and pt agreed to plan.   Labs Review Labs Reviewed - No data to display  Imaging Review Dg Ankle Complete Left  06/09/2014   CLINICAL DATA:  Pain and swelling of lateral left ankle after injury. Initial encounter.  EXAM: LEFT ANKLE COMPLETE - 3+ VIEW   COMPARISON:  None.  FINDINGS: No acute fracture is identified. There are proliferative changes seen involving both medial and lateral malleolus. The ankle mortise shows normal alignment. No bony lesions or destruction.  IMPRESSION: No acute fracture identified.   Electronically Signed   By: Aletta Edouard M.D.   On: 06/09/2014 22:56   Dg Foot Complete Left  06/09/2014   CLINICAL DATA:  Left foot pain. Foot injury. Dropped a large can of baked beans on the foot at [place] today.  EXAM: LEFT FOOT - COMPLETE 3+ VIEW  COMPARISON:  None.  FINDINGS: No fracture or dislocation. The alignment and joint spaces are maintained. There is a plantar calcaneal spur and Achilles tendon enthesophyte. No radiopaque foreign body or soft tissue air. Mild dorsal soft tissue edema.  IMPRESSION: No acute fracture or dislocation of the left foot.   Electronically Signed   By:  Jeb Levering M.D.   On: 06/09/2014 22:58     EKG Interpretation None      MDM   Final diagnoses:  Fracture  Left lateral ankle pain  Foot contusion, left, initial encounter    49 y.o. female here with L foot/ankle pain after dropping can of beans on it. Neurovascularly intact with soft compartments. Mild swelling and bruising over lateral malleolus, with slight TTP. Will obtain xray imaging then reassess. Pt declines meds at this time.  11:35 PM Xray neg. Will apply post op shoe and give crutches for comfort. Discussed RICE therapy and ortho f/up in 1-2wks for recheck. Will give pain meds for home. I explained the diagnosis and have given explicit precautions to return to the ER including for any other new or worsening symptoms. The patient understands and accepts the medical plan as it's been dictated and I have answered their questions. Discharge instructions concerning home care and prescriptions have been given. The patient is STABLE and is discharged to home in good condition.   I personally performed the services described in this  documentation, which was scribed in my presence. The recorded information has been reviewed and is accurate.  BP 156/80 mmHg  Pulse 68  Temp(Src) 98.7 F (37.1 C) (Oral)  Resp 14  Ht 5\' 8"  (1.727 m)  SpO2 99%  LMP 06/06/2014  Meds ordered this encounter  Medications  . naproxen (NAPROSYN) 500 MG tablet    Sig: Take 1 tablet (500 mg total) by mouth 2 (two) times daily as needed for mild pain, moderate pain or headache (TAKE WITH MEALS.).    Dispense:  20 tablet    Refill:  0    Order Specific Question:  Supervising Provider    Answer:  MILLER, BRIAN [[id number]]  . traMADol (ULTRAM) 50 MG tablet    Sig: Take 1 tablet (50 mg total) by mouth every 6 (six) hours as needed.    Dispense:  15 tablet    Refill:  0    Order Specific Question:  Supervising Provider    Answer:  Noemi Chapel [[id number]]      Lydiana Milley Camprubi-Soms, PA-C 06/09/14 1975  Fredia Sorrow, MD 06/10/14 [phone]

## 2014-06-09 NOTE — ED Notes (Signed)
Post op boot and crutches provided. Discussed how to use.

## 2014-06-09 NOTE — Discharge Instructions (Signed)
Wear post op shoe and Use crutches as needed for comfort. Ice and elevate foot/ankle throughout the day. Alternate between naprosyn and tramadol for pain relief. Do not drive or operate machinery with pain medication use. Call orthopedic follow up today or tomorrow to schedule followup appointment for recheck of ongoing ankle pain in 1-2 weeks that can be canceled with a 24-48 hour notice if complete resolution of pain. Return to the ER for changes or worsening symptoms.    Foot Contusion A foot contusion is a deep bruise to the foot. Contusions are the result of an injury that caused bleeding under the skin. The contusion may turn blue, purple, or yellow. Minor injuries will give you a painless contusion, but more severe contusions may stay painful and swollen for a few weeks. CAUSES  A foot contusion comes from a direct blow to that area, such as a heavy object falling on the foot. SYMPTOMS   Swelling of the foot.  Discoloration of the foot.  Tenderness or soreness of the foot. DIAGNOSIS  You will have a physical exam and will be asked about your history. You may need an X-ray of your foot to look for a broken bone (fracture).  TREATMENT  An elastic wrap may be recommended to support your foot. Resting, elevating, and applying cold compresses to your foot are often the best treatments for a foot contusion. Over-the-counter medicines may also be recommended for pain control. HOME CARE INSTRUCTIONS   Put ice on the injured area.  Put ice in a plastic bag.  Place a towel between your skin and the bag.  Leave the ice on for 15-20 minutes, 03-04 times a day.  Only take over-the-counter or prescription medicines for pain, discomfort, or fever as directed by your caregiver.  If told, use an elastic wrap as directed. This can help reduce swelling. You may remove the wrap for sleeping, showering, and bathing. If your toes become numb, cold, or blue, take the wrap off and reapply it more  loosely.  Elevate your foot with pillows to reduce swelling.  Try to avoid standing or walking while the foot is painful. Do not resume use until instructed by your caregiver. Then, begin use gradually. If pain develops, decrease use. Gradually increase activities that do not cause discomfort until you have normal use of your foot.  See your caregiver as directed. It is very important to keep all follow-up appointments in order to avoid any lasting problems with your foot, including long-term (chronic) pain. SEEK IMMEDIATE MEDICAL CARE IF:   You have increased redness, swelling, or pain in your foot.  Your swelling or pain is not relieved with medicines.  You have loss of feeling in your foot or are unable to move your toes.  Your foot turns cold or blue.  You have pain when you move your toes.  Your foot becomes warm to the touch.  Your contusion does not improve in 2 days. MAKE SURE YOU:   Understand these instructions.  Will watch your condition.  Will get help right away if you are not doing well or get worse. Document Released: 10/16/2005 Document Revised: 06/26/2011 Document Reviewed: 11/28/2010 [facility] Patient Information 2015 [place], [place]. This information is not intended to replace advice given to you by your health care provider. Make sure you discuss any questions you have with your health care provider.  Cryotherapy Cryotherapy is when you put ice on your injury. Ice helps lessen pain and puffiness (swelling) after an injury. Ice  works the best when you start using it in the first 24 to 48 hours after an injury. HOME CARE  Put a dry or damp towel between the ice pack and your skin.  You may press gently on the ice pack.  Leave the ice on for no more than 10 to 20 minutes at a time.  Check your skin after 5 minutes to make sure your skin is okay.  Rest at least 20 minutes between ice pack uses.  Stop using ice when your skin loses feeling (numbness).  Do  not use ice on someone who cannot tell you when it hurts. This includes small children and people with memory problems (dementia). GET HELP RIGHT AWAY IF:  You have white spots on your skin.  Your skin turns blue or pale.  Your skin feels waxy or hard.  Your puffiness gets worse. MAKE SURE YOU:   Understand these instructions.  Will watch your condition.  Will get help right away if you are not doing well or get worse. Document Released: 06/13/2007 Document Revised: 03/19/2011 Document Reviewed: 08/17/2010 [facility] Patient Information 2015 [place], [place]. This information is not intended to replace advice given to you by your health care provider. Make sure you discuss any questions you have with your health care provider.

## 2014-06-09 NOTE — ED Notes (Signed)
Patient transported to X-ray 

## 2014-10-02 IMAGING — CR DG CHEST 2V
2 series · 2 of 2 positions shown · non-contrast
Comparison: None.

CLINICAL DATA: Cough, shortness of breath

EXAM:
CHEST  2 VIEW

[w chest pa]
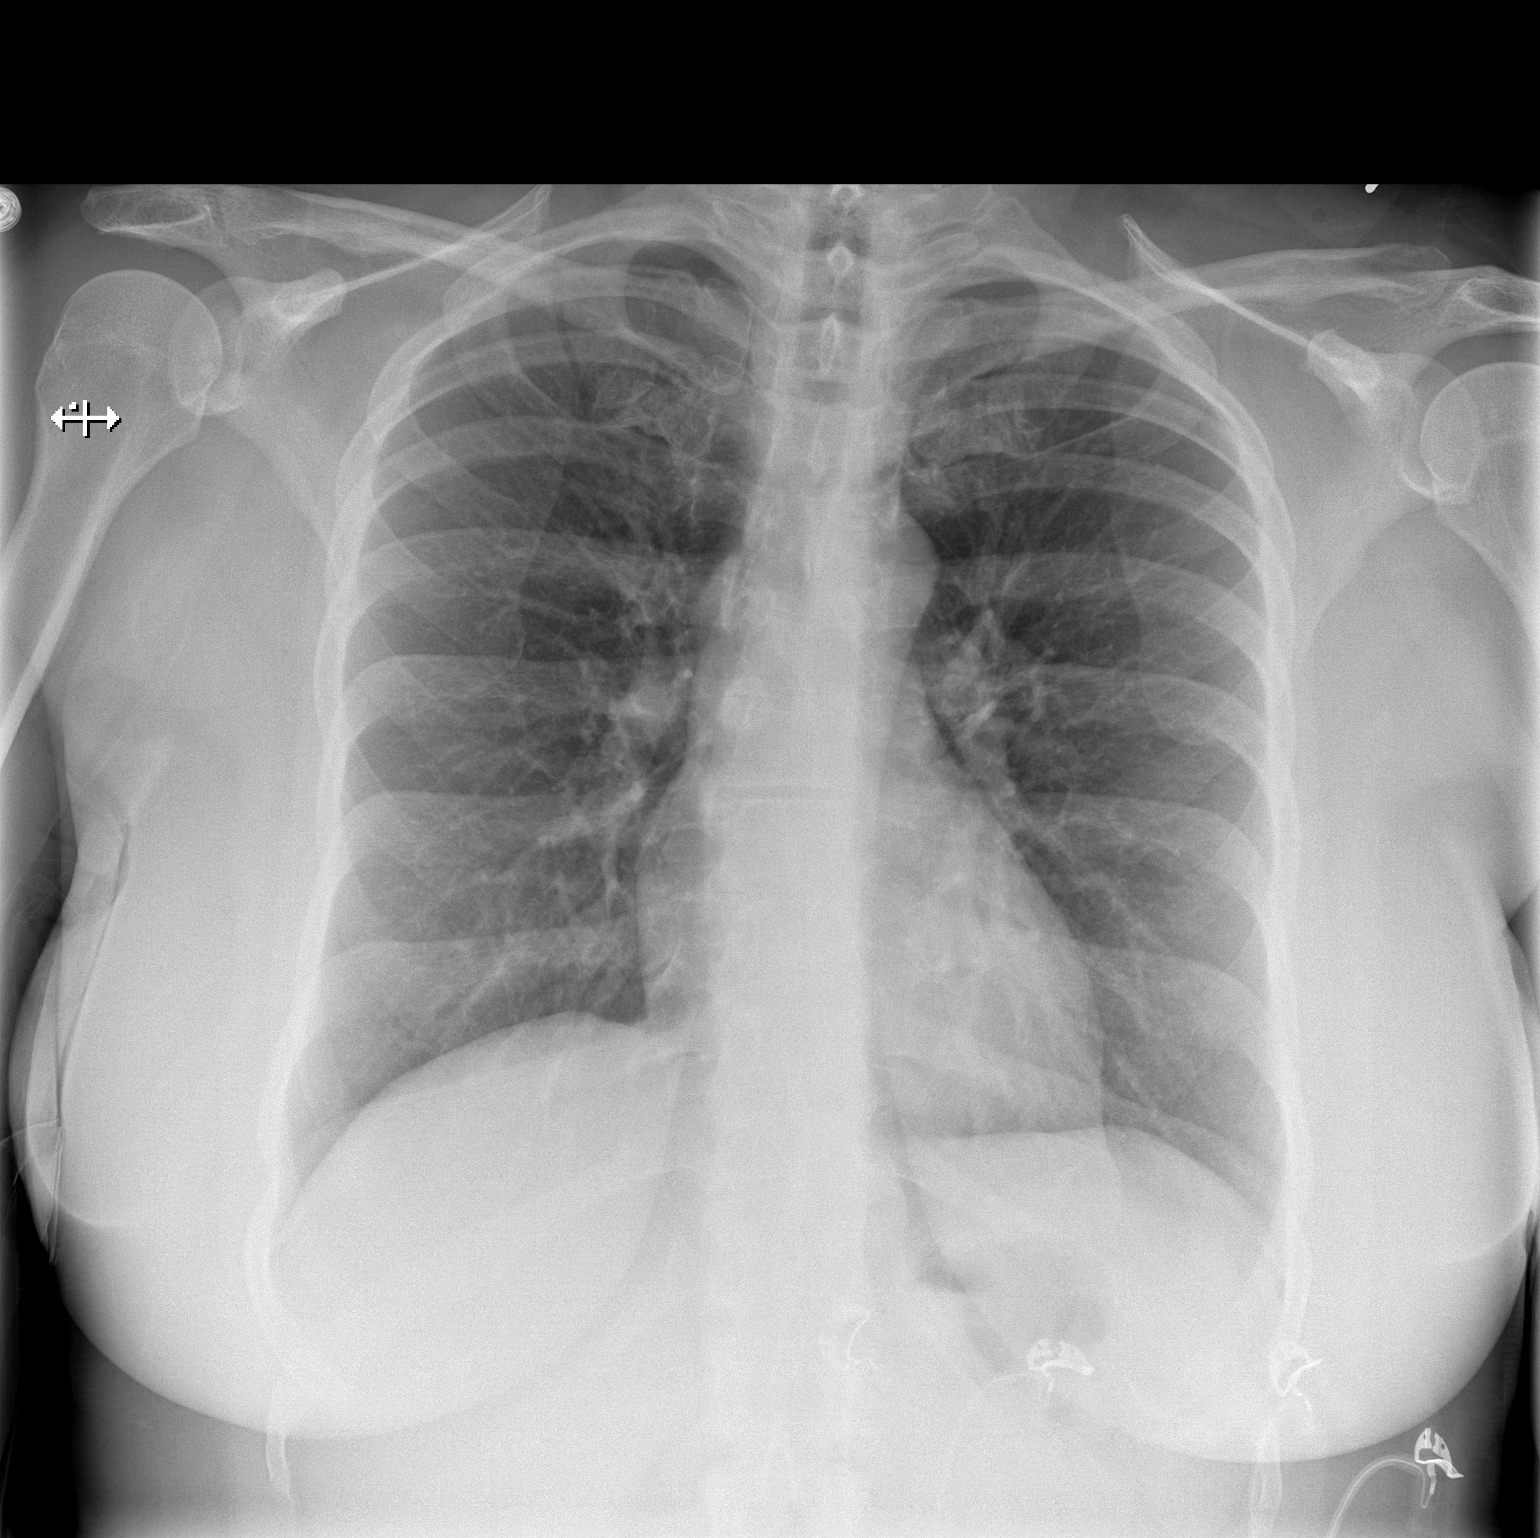

[w chest lat]
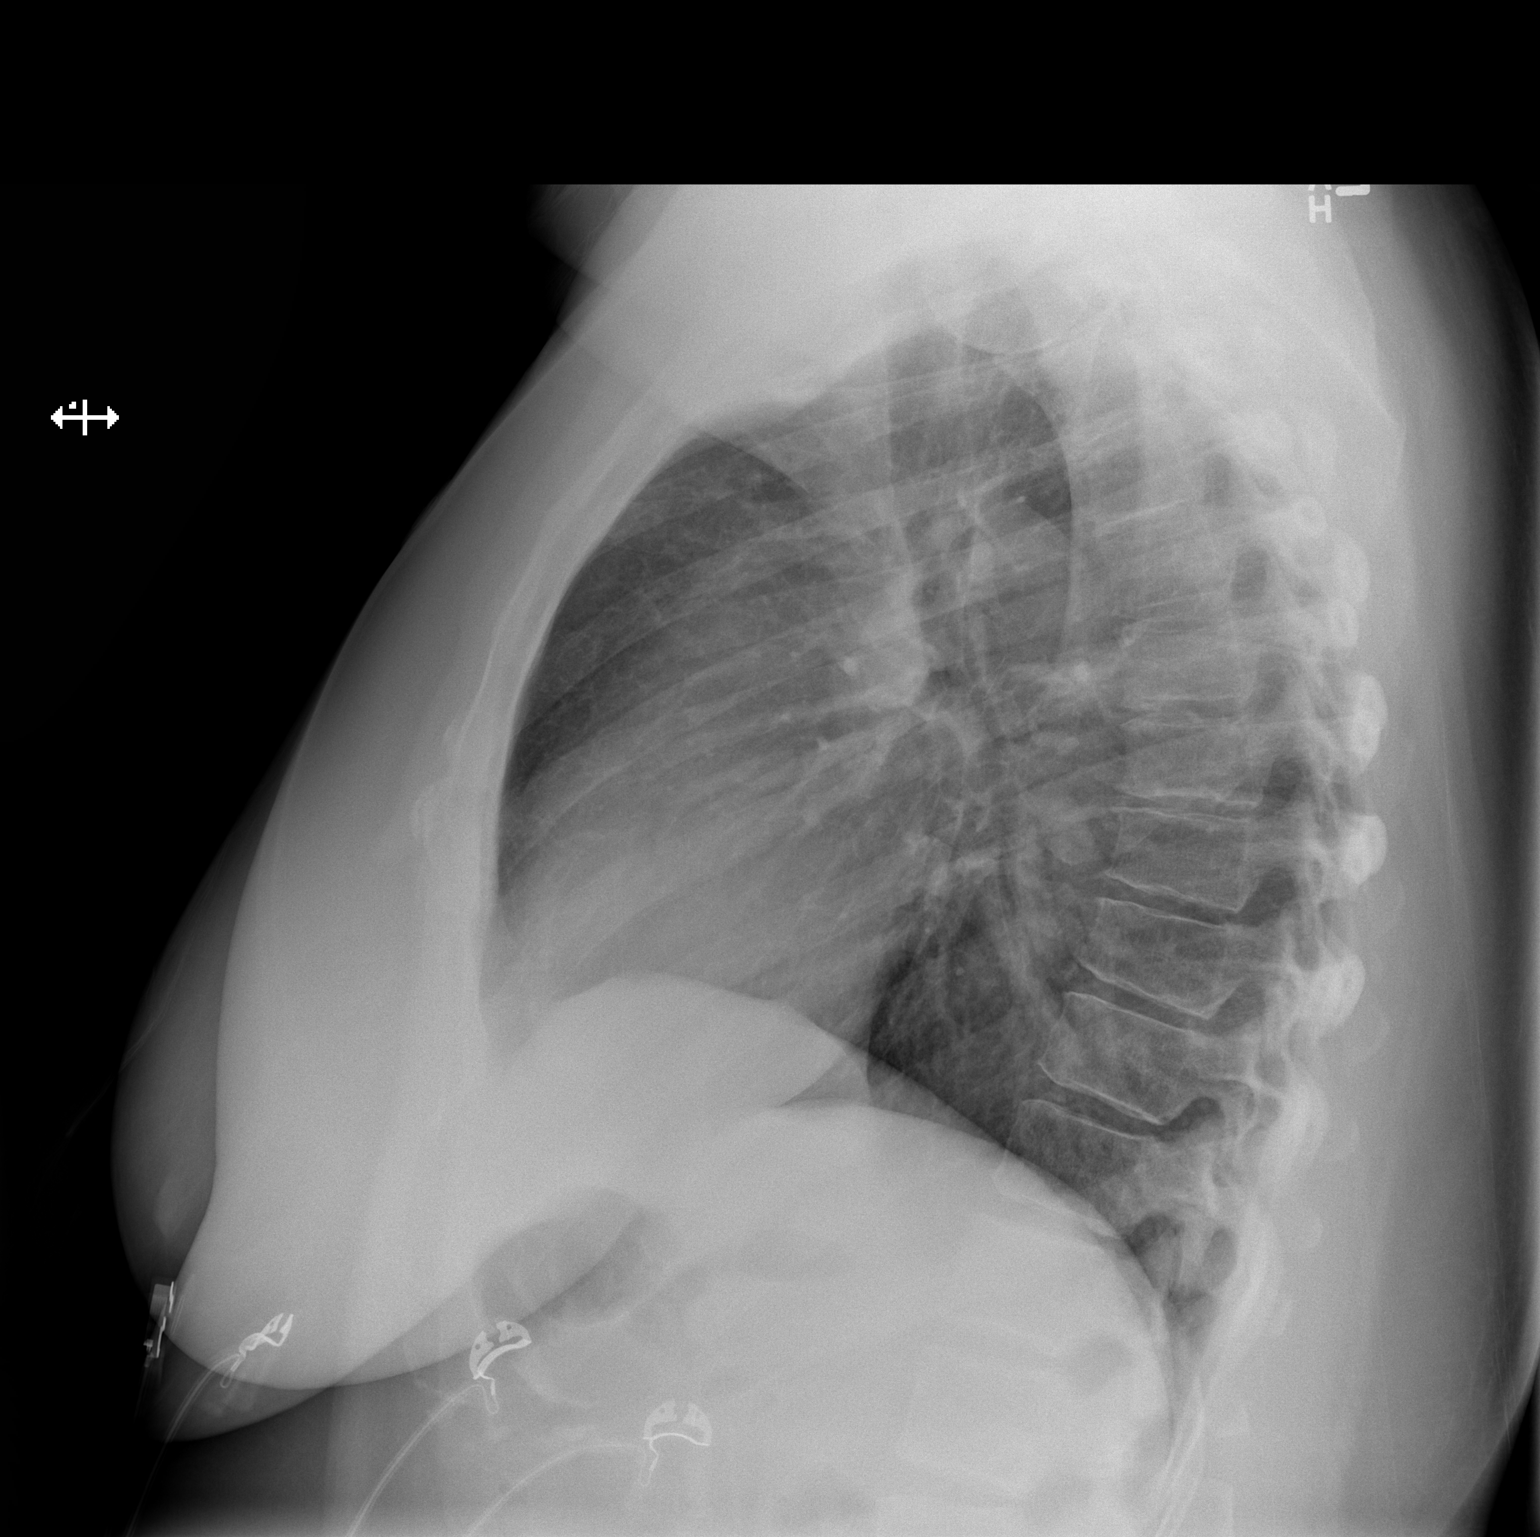

[2 of 2 positions shown; findings below may reference images not displayed]

FINDINGS: Lungs are clear. No pleural effusion or pneumothorax.

The heart is normal in size.

Visualized osseous structures are within normal limits.
IMPRESSION: Normal chest radiographs.

## 2014-10-29 ENCOUNTER — Encounter: Payer: Self-pay | Admitting: Family Medicine

## 2014-10-29 ENCOUNTER — Ambulatory Visit (INDEPENDENT_AMBULATORY_CARE_PROVIDER_SITE_OTHER): Payer: 59 | Admitting: Family Medicine

## 2014-10-29 VITALS — BP 132/74 | HR 72 | Temp 98.4°F | Resp 14 | Ht 68.0 in | Wt 218.0 lb

## 2014-10-29 DIAGNOSIS — B351 Tinea unguium: Secondary | ICD-10-CM

## 2014-10-29 DIAGNOSIS — E7800 Pure hypercholesterolemia, unspecified: Secondary | ICD-10-CM | POA: Diagnosis not present

## 2014-10-29 DIAGNOSIS — D509 Iron deficiency anemia, unspecified: Secondary | ICD-10-CM

## 2014-10-29 DIAGNOSIS — E669 Obesity, unspecified: Secondary | ICD-10-CM | POA: Diagnosis not present

## 2014-10-29 DIAGNOSIS — Z23 Encounter for immunization: Secondary | ICD-10-CM | POA: Diagnosis not present

## 2014-10-29 LAB — IRON: IRON: 45 ug/dL (ref 40–190)

## 2014-10-29 LAB — CBC WITH DIFFERENTIAL/PLATELET
BASOS ABS: 0 10*3/uL (ref 0.0–0.1)
Basophils Relative: 0 % (ref 0–1)
EOS ABS: 0.1 10*3/uL (ref 0.0–0.7)
Eosinophils Relative: 2 % (ref 0–5)
HEMATOCRIT: 36.4 % (ref 36.0–46.0)
HEMOGLOBIN: 11.8 g/dL — AB (ref 12.0–15.0)
LYMPHS ABS: 1.9 10*3/uL (ref 0.7–4.0)
LYMPHS PCT: 25 % (ref 12–46)
MCH: 25.7 pg — ABNORMAL LOW (ref 26.0–34.0)
MCHC: 32.4 g/dL (ref 30.0–36.0)
MCV: 79.1 fL (ref 78.0–100.0)
MPV: 9.4 fL (ref 8.6–12.4)
Monocytes Absolute: 0.4 10*3/uL (ref 0.1–1.0)
Monocytes Relative: 6 % (ref 3–12)
NEUTROS PCT: 67 % (ref 43–77)
Neutro Abs: 5 10*3/uL (ref 1.7–7.7)
PLATELETS: 282 10*3/uL (ref 150–400)
RBC: 4.6 MIL/uL (ref 3.87–5.11)
RDW: 14.3 % (ref 11.5–15.5)
WBC: 7.4 10*3/uL (ref 4.0–10.5)

## 2014-10-29 LAB — LIPID PANEL
CHOLESTEROL: 178 mg/dL (ref 125–200)
HDL: 44 mg/dL — ABNORMAL LOW (ref >=46)
LDL CALC: 118 mg/dL (ref <130)
Total CHOL/HDL Ratio: 4 Ratio (ref <=5.0)
Triglycerides: 80 mg/dL (ref <150)
VLDL: 16 mg/dL (ref <30)

## 2014-10-29 LAB — COMPREHENSIVE METABOLIC PANEL
ALT: 18 U/L (ref 6–29)
AST: 18 U/L (ref 10–35)
Albumin: 3.5 g/dL — ABNORMAL LOW (ref 3.6–5.1)
Alkaline Phosphatase: 62 U/L (ref 33–115)
BUN: 8 mg/dL (ref 7–25)
CO2: 25 mmol/L (ref 20–31)
CREATININE: 0.76 mg/dL (ref 0.50–1.10)
Calcium: 9 mg/dL (ref 8.6–10.2)
Chloride: 107 mmol/L (ref 98–110)
Glucose, Bld: 86 mg/dL (ref 70–99)
POTASSIUM: 3.9 mmol/L (ref 3.5–5.3)
Sodium: 140 mmol/L (ref 135–146)
TOTAL PROTEIN: 6.5 g/dL (ref 6.1–8.1)
Total Bilirubin: 0.5 mg/dL (ref 0.2–1.2)

## 2014-10-29 NOTE — Assessment & Plan Note (Signed)
Recheck lipids, continue with healthy eating diet and weight loss. She is doing very well with accountability. FLu shot given Continue vitamins

## 2014-10-29 NOTE — Progress Notes (Signed)
Patient ID: Susane Bey, female   DOB: [birth year], 49 y.o.   MRN: [id number]   Subjective:    Patient ID: Randall An, female    DOB: [birth year], 49 y.o.   MRN: [id number]  Patient presents for 6 month F/U Pt here to f/u labs, history of hyperlipidemia and obesity. Has been monitoring diet, using calorie counter and walking 3 miles a day, has lost 27lbs according to her scales at home, down 23lbs since our last visit. Feels well. Only concern is recurrent fungus on toenails, has taken Lamisil, did home remedies, 2nd digit nail fell off and returned with same fungus.     Review Of Systems:  GEN- denies fatigue, fever, weight loss,weakness, recent illness HEENT- denies eye drainage, change in vision, nasal discharge, CVS- denies chest pain, palpitations RESP- denies SOB, cough, wheeze ABD- denies N/V, change in stools, abd pain GU- denies dysuria, hematuria, dribbling, incontinence MSK- denies joint pain, muscle aches, injury Neuro- denies headache, dizziness, syncope, seizure activity       Objective:    BP 132/74 mmHg  Pulse 72  Temp(Src) 98.4 F (36.9 C) (Oral)  Resp 14  Ht 5\' 8"  (1.727 m)  Wt 218 lb (98.884 kg)  BMI 33.15 kg/m2  LMP 10/24/2014 GEN- NAD, alert and oriented x3  HEENT- PERRL, EOMI, non injected sclera, pink conjunctiva, MMM, oropharynx clear CVS- RRR,2/6 SEM RESP-CTAB EXT- No edema Skin- thickened great toenail and 2nd nails bilat, mild yellow discoloration at tip of great toes Pulses- Radial, DP- 2+        Assessment & Plan:      Problem List Items Addressed This Visit    Onychomycosis    Refer to podiatry, has done oral meds, topicals      Relevant Orders   Ambulatory referral to Podiatry   Obesity - Primary   HYPERCHOLESTEROLEMIA    Recheck lipids, continue with healthy eating diet and weight loss. She is doing very well with accountability. FLu shot given Continue vitamins      Relevant Orders   CBC with  Differential/Platelet   Comprehensive metabolic panel   Lipid panel   Anemia, iron deficiency   Relevant Orders   CBC with Differential/Platelet   Iron    Other Visit Diagnoses    Need for prophylactic vaccination and inoculation against influenza        Relevant Orders    Flu Vaccine QUAD 36+ mos PF IM (Fluarix & Fluzone Quad PF) (Completed)       Note: This dictation was prepared with Dragon dictation along with smaller phrase technology. Any transcriptional errors that result from this process are unintentional.

## 2014-10-29 NOTE — Assessment & Plan Note (Signed)
Refer to podiatry, has done oral meds, topicals

## 2014-10-29 NOTE — Patient Instructions (Addendum)
We will call with lab results Podiatry referral  Flu shot given F/U 6 months for Physical

## 2014-11-05 ENCOUNTER — Telehealth: Payer: Self-pay | Admitting: *Deleted

## 2014-11-05 NOTE — Telephone Encounter (Signed)
Submitted referral thru [facility] compass to Dr. Elisabeth Pigeon podiatry with referral number [phone]  Type of referral: consult and treat  Number of visits:6  Start date: 11/04/14  End date:05/05/15  Dx: B35.1-Tinea unguium  Copy has been faxed to [facility]

## 2014-11-19 ENCOUNTER — Ambulatory Visit: Payer: 59 | Admitting: Podiatry

## 2014-11-26 IMAGING — CR DG CHEST 2V
2 series · 2 of 2 positions shown · non-contrast
Comparison: Chest x-ray 12/07/2012.

CLINICAL DATA: Cough for the past 2 weeks.

EXAM:
CHEST  2 VIEW

[view not recorded (1 of 2)]
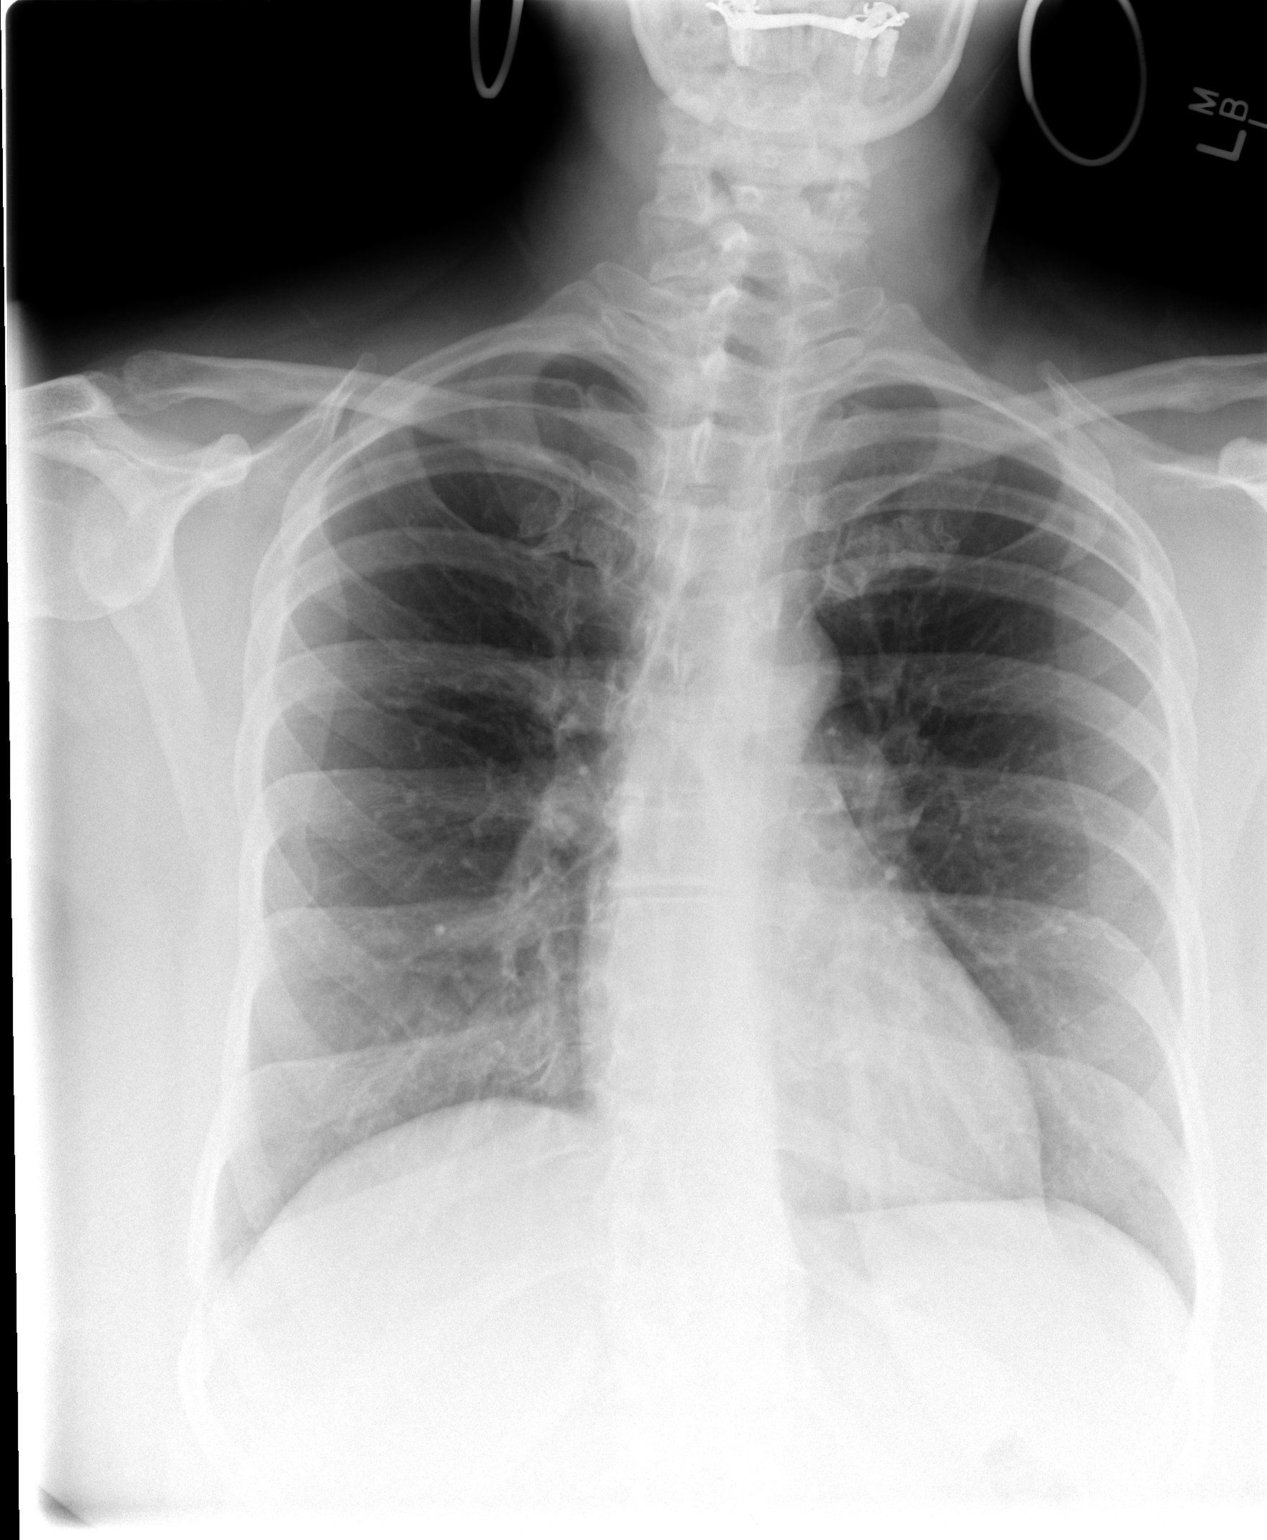

[view not recorded (2 of 2)]
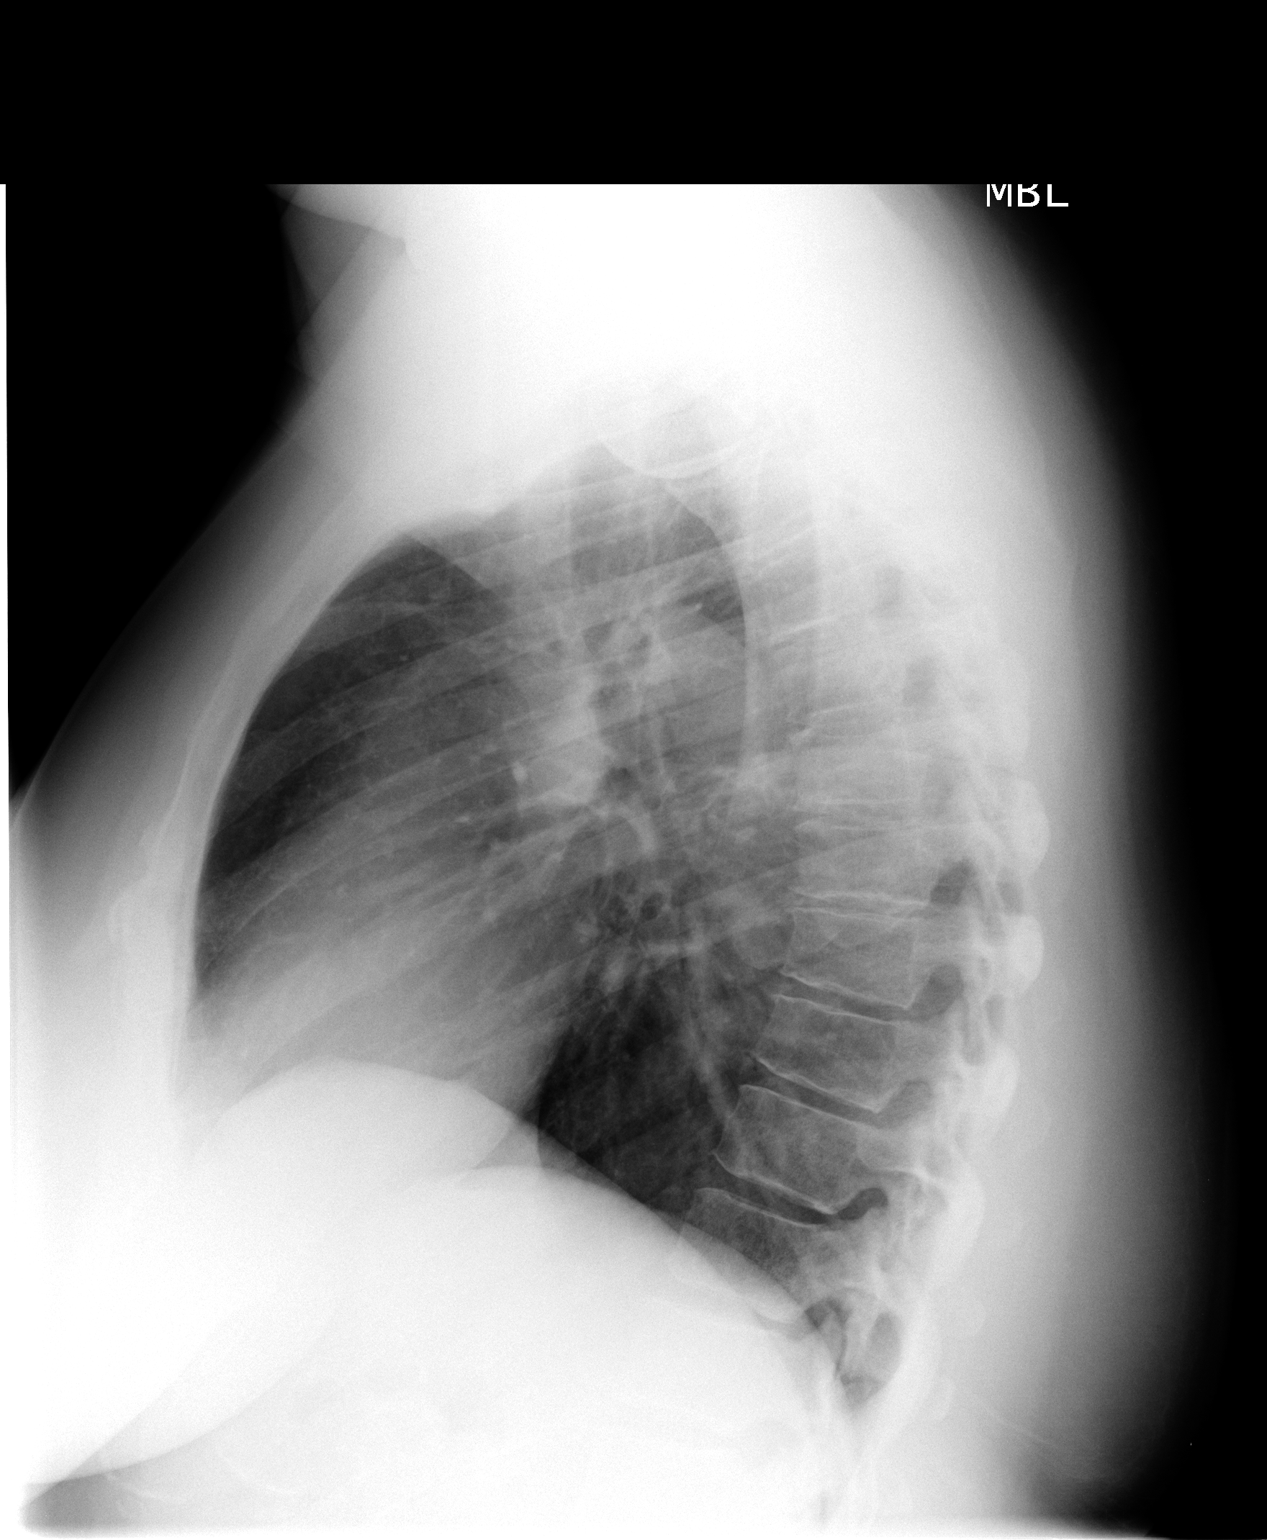

[2 of 2 positions shown; findings below may reference images not displayed]

FINDINGS: Lung volumes are normal. No consolidative airspace disease. No
pleural effusions. No pneumothorax. No pulmonary nodule or mass
noted. Pulmonary vasculature and the cardiomediastinal silhouette
are within normal limits.
IMPRESSION: 1.  No radiographic evidence of acute cardiopulmonary disease.

## 2014-12-06 ENCOUNTER — Ambulatory Visit (INDEPENDENT_AMBULATORY_CARE_PROVIDER_SITE_OTHER): Payer: 59 | Admitting: Podiatry

## 2014-12-06 ENCOUNTER — Encounter: Payer: Self-pay | Admitting: Podiatry

## 2014-12-06 VITALS — BP 161/97 | HR 77 | Resp 18

## 2014-12-06 DIAGNOSIS — B351 Tinea unguium: Secondary | ICD-10-CM | POA: Diagnosis not present

## 2014-12-06 MED ORDER — UREA NAIL 45 % EX GEL: 1.0000 "application " | Freq: Every day | CUTANEOUS | Status: DC

## 2014-12-06 NOTE — Progress Notes (Signed)
   Subjective:    Patient ID: Rachel Cortez, female    DOB: 30-Mar-1965, 49 y.o.   MRN: [id number]  HPI  49 year old female presents after consent of thick, discolored toenails which has been ongoing for several years. She states about 10 years ago is when the symptoms started. She states that she is grossly tried topical medication and she is also tried Lamisil several years ago but it did not seem to help. She does that she's had multiple injuries to her toenails over the years and no recent. She says that his nails are not painful denies any swelling redness or drainage. No other complaints at this time.   Review of Systems  All other systems reviewed and are negative.      Objective:   Physical Exam General: AAO x3, NAD  Dermatological: Skin is warm, dry and supple bilateral. Nails are hypertrophic, dystrophic, brittle, discolored 10. Particularly bilateral hallux nails and the most significant. There is no tenderness to palpation of the nails there is no surrounding erythema or drainage. There are no open sores, no preulcerative lesions, no rash or signs of infection present.  Vascular: Dorsalis Pedis artery and Posterior Tibial artery pedal pulses are 2/4 bilateral with immedate capillary fill time. Pedal hair growth present. No varicosities and no lower extremity edema present bilateral. There is no pain with calf compression, swelling, warmth, erythema.   Neruologic: Grossly intact via light touch bilateral. Vibratory intact via tuning fork bilateral. Protective threshold with Semmes Wienstein monofilament intact to all pedal sites bilateral.    Musculoskeletal: No areas of tenderness to bilateral lower extremities. No gross boney pedal deformities bilateral. No pain, crepitus, or limitation noted with foot and ankle range of motion bilateral. Muscular strength 5/5 in all groups tested bilateral.  Gait: Unassisted, Nonantalgic.      Assessment & Plan:   49 year old female  with onychodystrophy, likely onychomycosis. -Treatment options discussed including all alternatives, risks, and complications -Etiology of symptoms were discussed -The nails were biopsied and sent to [facility] for evaluation of onychomycosis. Discussed treatment options for onychomycosis bowl with results of the biopsy. -Prescribed urea nail gel to help thin the toenails for now. -Follow-up after nail culture or sooner if any problems arise. In the meantime, encouraged to call the office with any questions, concerns, change in symptoms.   Celesta Gentile, DPM

## 2014-12-08 ENCOUNTER — Telehealth: Payer: Self-pay | Admitting: Podiatry

## 2014-12-08 NOTE — Telephone Encounter (Signed)
Pt called and the rx that she was given on Monday is 158.00 and she cannot pay that. She was told by you to call if there was an issue.Please call pt.

## 2014-12-09 NOTE — Telephone Encounter (Signed)
Sent a message for Dr Jacqualyn Posey to advise. Lattie Haw

## 2014-12-20 ENCOUNTER — Telehealth: Payer: Self-pay | Admitting: *Deleted

## 2014-12-20 NOTE — Telephone Encounter (Addendum)
Dr. Jacqualyn Posey reviewed pt's fungal culture 12/06/2014 as negative.  Left message for pt to call.  Informed pt of results, and suggested [id number] for daily use to soften and thin toenail thickness.  Pt states will be by today to pick up.

## 2014-12-21 DIAGNOSIS — B351 Tinea unguium: Secondary | ICD-10-CM

## 2014-12-21 NOTE — Telephone Encounter (Signed)
Patient came by the [facility] office and got some revitaderm and was going to use that and try it for about three weeks and to call the office to make an appointment if this did not work. Rachel Cortez

## 2015-05-03 ENCOUNTER — Encounter: Payer: 59 | Admitting: Family Medicine

## 2016-02-15 ENCOUNTER — Telehealth: Payer: Self-pay | Admitting: *Deleted

## 2016-02-15 NOTE — Telephone Encounter (Signed)
Received VM from patient.   Reports that she requires PPD testing and CPE for work, but has no insurance at this time.   Would like to discuss costs of CPE and PPD.   Please contact her at [phone].

## 2016-03-16 ENCOUNTER — Encounter: Payer: Self-pay | Admitting: Family Medicine

## 2016-03-16 ENCOUNTER — Ambulatory Visit (INDEPENDENT_AMBULATORY_CARE_PROVIDER_SITE_OTHER): Payer: Self-pay | Admitting: Family Medicine

## 2016-03-16 VITALS — BP 138/78 | HR 82 | Temp 98.6°F | Resp 16 | Ht 68.0 in | Wt 218.0 lb

## 2016-03-16 DIAGNOSIS — Z0000 Encounter for general adult medical examination without abnormal findings: Secondary | ICD-10-CM

## 2016-03-16 DIAGNOSIS — E6609 Other obesity due to excess calories: Secondary | ICD-10-CM

## 2016-03-16 DIAGNOSIS — Z6830 Body mass index (BMI) 30.0-30.9, adult: Secondary | ICD-10-CM

## 2016-03-16 NOTE — Patient Instructions (Signed)
Get the PPD F/Uas needed

## 2016-03-16 NOTE — Progress Notes (Signed)
   Subjective:    Patient ID: Rachel Cortez, female    DOB: [birth year], 51 y.o.   MRN: [id number]  Patient presents for CPE (is not fasting- has form for work)   Pt here for CPE, Has for work  Last Mammogram 2016 Currently uninsured, unable to afford fasting labs PAP 2015 Due for colonoscopy     Review Of Systems:  GEN- denies fatigue, fever, weight loss,weakness, recent illness HEENT- denies eye drainage, change in vision, nasal discharge, CVS- denies chest pain, palpitations RESP- denies SOB, cough, wheeze ABD- denies N/V, change in stools, abd pain GU- denies dysuria, hematuria, dribbling, incontinence MSK- denies joint pain, muscle aches, injury Neuro- denies headache, dizziness, syncope, seizure activity       Objective:    BP 138/78   Pulse 82   Temp 98.6 F (37 C) (Oral)   Resp 16   Ht 5\' 8"  (1.727 m)   Wt 218 lb (98.9 kg)   LMP 03/11/2016 Comment: irregular  SpO2 99%   BMI 33.15 kg/m  GEN- NAD, alert and oriented x3 HEENT- PERRL, EOMI, non injected sclera, pink conjunctiva, MMM, oropharynx clear Neck- Supple, no thyromegaly CVS- RRR, no murmur RESP-CTAB ABD-NABS,soft,NT,ND EXT- No edema Pulses- Radial, DP- 2+        Assessment & Plan:      Problem List Items Addressed This Visit    Obesity    Other Visit Diagnoses    Routine general medical examination at a health care facility    -  Primary   CPE done, unable to get labs as uninsured. She will hopefully be able to return within next 6 months. Continue to work on [employer]      Note: This dictation was prepared with Diplomatic Services operational officer dictation along with smaller Company secretary. Any transcriptional errors that result from this process are unintentional.

## 2016-03-18 ENCOUNTER — Encounter: Payer: Self-pay | Admitting: Family Medicine

## 2016-06-01 ENCOUNTER — Encounter: Payer: Self-pay | Admitting: Family Medicine

## 2016-07-23 ENCOUNTER — Emergency Department (HOSPITAL_COMMUNITY)
Admission: EM | Admit: 2016-07-23 | Discharge: 2016-07-23 | Disposition: A | Discharge status: Home / Self Care | Payer: No Typology Code available for payment source | Attending: Emergency Medicine | Admitting: Emergency Medicine

## 2016-07-23 ENCOUNTER — Encounter (HOSPITAL_COMMUNITY): Payer: Self-pay | Admitting: Emergency Medicine

## 2016-07-23 DIAGNOSIS — Z91013 Allergy to seafood: Secondary | ICD-10-CM | POA: Diagnosis not present

## 2016-07-23 DIAGNOSIS — S199XXA Unspecified injury of neck, initial encounter: Secondary | ICD-10-CM | POA: Insufficient documentation

## 2016-07-23 DIAGNOSIS — Z885 Allergy status to narcotic agent status: Secondary | ICD-10-CM | POA: Insufficient documentation

## 2016-07-23 DIAGNOSIS — Y92410 Unspecified street and highway as the place of occurrence of the external cause: Secondary | ICD-10-CM | POA: Diagnosis not present

## 2016-07-23 DIAGNOSIS — Z880 Allergy status to penicillin: Secondary | ICD-10-CM | POA: Insufficient documentation

## 2016-07-23 DIAGNOSIS — M25519 Pain in unspecified shoulder: Secondary | ICD-10-CM | POA: Diagnosis not present

## 2016-07-23 DIAGNOSIS — Y999 Unspecified external cause status: Secondary | ICD-10-CM | POA: Diagnosis not present

## 2016-07-23 DIAGNOSIS — Y939 Activity, unspecified: Secondary | ICD-10-CM | POA: Insufficient documentation

## 2016-07-23 NOTE — Discharge Instructions (Signed)
Continue to take Tylenol or Advil as directed. See your primary care physician or an urgent care center if not improving in 3 or 4 days. Return if concern for any reason
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
## 2016-12-19 ENCOUNTER — Other Ambulatory Visit: Payer: Self-pay

## 2016-12-19 ENCOUNTER — Encounter: Payer: Self-pay | Admitting: Family Medicine

## 2016-12-19 ENCOUNTER — Ambulatory Visit (INDEPENDENT_AMBULATORY_CARE_PROVIDER_SITE_OTHER): Payer: Medicaid Other | Admitting: Family Medicine

## 2016-12-19 ENCOUNTER — Encounter: Payer: Self-pay | Admitting: Licensed Clinical Social Worker

## 2016-12-19 VITALS — BP 136/80 | HR 77 | Temp 98.0°F | Ht 69.0 in | Wt 227.0 lb

## 2016-12-19 DIAGNOSIS — R030 Elevated blood-pressure reading, without diagnosis of hypertension: Secondary | ICD-10-CM

## 2016-12-19 DIAGNOSIS — Z0189 Encounter for other specified special examinations: Secondary | ICD-10-CM | POA: Diagnosis not present

## 2016-12-19 DIAGNOSIS — E669 Obesity, unspecified: Secondary | ICD-10-CM

## 2016-12-19 DIAGNOSIS — Z7689 Persons encountering health services in other specified circumstances: Secondary | ICD-10-CM

## 2016-12-19 MED ORDER — ONE-DAILY MULTI VITAMINS PO TABS: 1.0000 | ORAL_TABLET | Freq: Every day | ORAL | 0 refills | Status: DC

## 2016-12-19 NOTE — Progress Notes (Signed)
Type of Service: Clinical Social Work Consult  Rachel Cortez is a 51 y.o. female referred by Dr. Andy Gauss.  Patient has no insurance, works FT but unable to afford insurance from the [employer].  Patient has applied for Medicaid and is waiting for her denial letter. Patient has no minor children in the home.  Issues discussed: community resources, [employer], Biochemist, clinical from [employer]. Intervention:  solutions focus strategies, community resources  ASSESSMENT:.Patient has no insurance at time of visit.  She is not eligible for Medicaid and cannot afford $300 per month premium from the [facility], may benefit from, and is in agreement to contact the [place] to request an exemption letter and to see if there are any other options for affordable insurance.  PLAN: Patient will F/U with [facility] application and [employer].  No other services needs at this time.  Casimer Lanius, LCSW Licensed Clinical Social Worker [place]   [phone] 4:11 PM
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
## 2016-12-19 NOTE — Patient Instructions (Signed)
It was great seeing you today! We have addressed the following issues today  1. Please work on your financial assistance program so we can start tackling some of your medical issues. 2. Your Blood pressure is slightly elevated today we will monitor it and discuss option at our next visit.  If we did any lab work today, and the results require attention, either me or my nurse will get in touch with you. If everything is normal, you will get a letter in mail and a message via . If you don't hear from [place] in two weeks, please give [place] a call. Otherwise, we look forward to seeing you again at your next visit. If you have any questions or concerns before then, please call the clinic at [phone].  Please bring all your medications to every doctors visit  Sign up for My Chart to have easy access to your labs results, and communication with your Primary care physician. Please ask Front Desk for some assistance.   Please check-out at the front desk before leaving the clinic.    Take Care,   Dr. Andy Gauss

## 2016-12-24 ENCOUNTER — Ambulatory Visit: Payer: Self-pay | Admitting: Family Medicine

## 2017-02-08 ENCOUNTER — Encounter: Payer: Self-pay | Admitting: Family Medicine

## 2017-02-27 ENCOUNTER — Other Ambulatory Visit: Payer: Self-pay | Admitting: Family Medicine

## 2017-02-27 DIAGNOSIS — Z1231 Encounter for screening mammogram for malignant neoplasm of breast: Secondary | ICD-10-CM

## 2017-03-29 ENCOUNTER — Other Ambulatory Visit: Payer: Self-pay

## 2017-03-29 ENCOUNTER — Ambulatory Visit (INDEPENDENT_AMBULATORY_CARE_PROVIDER_SITE_OTHER): Payer: BLUE CROSS/BLUE SHIELD | Admitting: Family Medicine

## 2017-03-29 ENCOUNTER — Ambulatory Visit
Admission: RE | Admit: 2017-03-29 | Discharge: 2017-03-29 | Disposition: A | Discharge status: Home / Self Care | Payer: BLUE CROSS/BLUE SHIELD | Source: Ambulatory Visit | Attending: Family Medicine | Admitting: Family Medicine

## 2017-03-29 ENCOUNTER — Encounter: Payer: Self-pay | Admitting: Family Medicine

## 2017-03-29 ENCOUNTER — Telehealth: Payer: Self-pay | Admitting: *Deleted

## 2017-03-29 VITALS — BP 124/70 | HR 78 | Temp 98.2°F | Resp 14 | Ht 69.0 in | Wt 224.0 lb

## 2017-03-29 DIAGNOSIS — E7800 Pure hypercholesterolemia, unspecified: Secondary | ICD-10-CM | POA: Diagnosis not present

## 2017-03-29 DIAGNOSIS — Z111 Encounter for screening for respiratory tuberculosis: Secondary | ICD-10-CM

## 2017-03-29 DIAGNOSIS — E6609 Other obesity due to excess calories: Secondary | ICD-10-CM | POA: Diagnosis not present

## 2017-03-29 DIAGNOSIS — Z6833 Body mass index (BMI) 33.0-33.9, adult: Secondary | ICD-10-CM

## 2017-03-29 DIAGNOSIS — Z0000 Encounter for general adult medical examination without abnormal findings: Secondary | ICD-10-CM | POA: Diagnosis not present

## 2017-03-29 DIAGNOSIS — Z1211 Encounter for screening for malignant neoplasm of colon: Secondary | ICD-10-CM | POA: Diagnosis not present

## 2017-03-29 DIAGNOSIS — Z1231 Encounter for screening mammogram for malignant neoplasm of breast: Secondary | ICD-10-CM

## 2017-03-29 DIAGNOSIS — Z124 Encounter for screening for malignant neoplasm of cervix: Secondary | ICD-10-CM

## 2017-03-29 LAB — COMPREHENSIVE METABOLIC PANEL
AG Ratio: 1.3 (calc) (ref 1.0–2.5)
ALBUMIN MSPROF: 3.8 g/dL (ref 3.6–5.1)
ALT: 18 U/L (ref 6–29)
AST: 24 U/L (ref 10–35)
Alkaline phosphatase (APISO): 84 U/L (ref 33–130)
BUN: 10 mg/dL (ref 7–25)
CO2: 26 mmol/L (ref 20–32)
Calcium: 9.3 mg/dL (ref 8.6–10.4)
Chloride: 105 mmol/L (ref 98–110)
Creat: 0.86 mg/dL (ref 0.50–1.05)
Globulin: 2.9 g/dL (calc) (ref 1.9–3.7)
Glucose, Bld: 81 mg/dL (ref 65–99)
POTASSIUM: 3.9 mmol/L (ref 3.5–5.3)
SODIUM: 140 mmol/L (ref 135–146)
TOTAL PROTEIN: 6.7 g/dL (ref 6.1–8.1)
Total Bilirubin: 0.5 mg/dL (ref 0.2–1.2)

## 2017-03-29 LAB — LIPID PANEL
Cholesterol: 248 mg/dL — ABNORMAL HIGH (ref <200)
HDL: 49 mg/dL — ABNORMAL LOW (ref >50)
LDL CHOLESTEROL (CALC): 175 mg/dL — AB
Non-HDL Cholesterol (Calc): 199 mg/dL (calc) — ABNORMAL HIGH (ref <130)
TRIGLYCERIDES: 110 mg/dL (ref <150)
Total CHOL/HDL Ratio: 5.1 (calc) — ABNORMAL HIGH (ref <5.0)

## 2017-03-29 LAB — CBC WITH DIFFERENTIAL/PLATELET
BASOS ABS: 36 {cells}/uL (ref 0–200)
Basophils Relative: 0.4 %
EOS PCT: 1 %
Eosinophils Absolute: 89 cells/uL (ref 15–500)
HCT: 38.6 % (ref 35.0–45.0)
Hemoglobin: 12.8 g/dL (ref 11.7–15.5)
Lymphs Abs: 2563 cells/uL (ref 850–3900)
MCH: 26.7 pg — ABNORMAL LOW (ref 27.0–33.0)
MCHC: 33.2 g/dL (ref 32.0–36.0)
MCV: 80.4 fL (ref 80.0–100.0)
MONOS PCT: 5.7 %
MPV: 10.2 fL (ref 7.5–12.5)
Neutro Abs: 5705 cells/uL (ref 1500–7800)
Neutrophils Relative %: 64.1 %
Platelets: 282 10*3/uL (ref 140–400)
RBC: 4.8 10*6/uL (ref 3.80–5.10)
RDW: 12.8 % (ref 11.0–15.0)
Total Lymphocyte: 28.8 %
WBC mixed population: 507 cells/uL (ref 200–950)
WBC: 8.9 10*3/uL (ref 3.8–10.8)

## 2017-03-29 NOTE — Telephone Encounter (Signed)
Received verbal orders for Cologuard.   Order placed via [employer].   Order [id number] has been submitted.
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
## 2017-03-29 NOTE — Patient Instructions (Signed)
F/U 1 year for physical Cologaurd to be done

## 2017-04-01 ENCOUNTER — Ambulatory Visit: Payer: BLUE CROSS/BLUE SHIELD

## 2017-04-01 DIAGNOSIS — Z9289 Personal history of other medical treatment: Secondary | ICD-10-CM

## 2017-04-01 LAB — PAP IG W/ RFLX HPV ASCU

## 2017-04-01 LAB — TB SKIN TEST
Induration: 0 mm
TB SKIN TEST: NEGATIVE

## 2017-04-01 NOTE — Progress Notes (Signed)
TB test was negative. Documented accordingly.

## 2017-04-04 ENCOUNTER — Encounter: Payer: Self-pay | Admitting: *Deleted

## 2017-04-08 ENCOUNTER — Encounter: Payer: Self-pay | Admitting: Family Medicine

## 2017-04-08 LAB — COLOGUARD

## 2017-04-10 ENCOUNTER — Encounter: Payer: Self-pay | Admitting: Family Medicine

## 2017-04-11 ENCOUNTER — Ambulatory Visit (HOSPITAL_COMMUNITY)
Admission: EM | Admit: 2017-04-11 | Discharge: 2017-04-11 | Disposition: A | Discharge status: Home / Self Care | Payer: BLUE CROSS/BLUE SHIELD | Attending: Family Medicine | Admitting: Family Medicine

## 2017-04-11 ENCOUNTER — Encounter (HOSPITAL_COMMUNITY): Payer: Self-pay | Admitting: Family Medicine

## 2017-04-11 DIAGNOSIS — M62830 Muscle spasm of back: Secondary | ICD-10-CM | POA: Diagnosis not present

## 2017-04-11 DIAGNOSIS — M940 Chondrocostal junction syndrome [Tietze]: Secondary | ICD-10-CM | POA: Diagnosis not present

## 2017-04-11 MED ORDER — CYCLOBENZAPRINE HCL 10 MG PO TABS: 10.0000 mg | ORAL_TABLET | Freq: Three times a day (TID) | ORAL | 0 refills | Status: DC

## 2017-04-11 MED ORDER — DICLOFENAC SODIUM 75 MG PO TBEC: 75.0000 mg | DELAYED_RELEASE_TABLET | Freq: Two times a day (BID) | ORAL | 0 refills | Status: DC

## 2017-04-11 MED ORDER — RIZATRIPTAN BENZOATE 10 MG PO TABS: 10.0000 mg | ORAL_TABLET | ORAL | 2 refills | Status: DC | PRN

## 2017-04-11 MED ORDER — ONDANSETRON 4 MG PO TBDP: 4.0000 mg | ORAL_TABLET | Freq: Three times a day (TID) | ORAL | 2 refills | Status: DC | PRN

## 2017-04-11 NOTE — ED Provider Notes (Signed)
[facility]   [id number] 04/11/17 Arrival Time: 1020  ASSESSMENT & PLAN:  1. Muscle spasm of back   2. Costochondritis    Meds ordered this encounter  Medications  . cyclobenzaprine (FLEXERIL) 10 MG tablet    Sig: Take 1 tablet (10 mg total) by mouth 3 (three) times daily.    Dispense:  20 tablet    Refill:  0  . diclofenac (VOLTAREN) 75 MG EC tablet    Sig: Take 1 tablet (75 mg total) by mouth 2 (two) times daily.    Dispense:  14 tablet    Refill:  0   Medication sedation precautions. Work note given for today. Activities as tolerated. Will follow up with PCP or here if worsening or failing to improve as anticipated.  Reviewed expectations re: course of current medical issues. Questions answered. Outlined signs and symptoms indicating need for more acute intervention. Patient verbalized understanding. After Visit Summary given.  SUBJECTIVE: History from: patient. Rachel Cortez is a 52 y.o. female who reports fairly persistent mid back pain along with anterior chest wall pain. No injury reported. Gradual onset. First noticed this morning after waking. Describes mid back pain as an ache with sharp pains, especially with certain movements. Anterior chest wall pain the same. No n/v/SOB. Mild L arm discomfort noted when sharp back pain present. No specific neck pain. No extremity sensation changes or weakness. No OTC treatment.  ROS: As per HPI.   OBJECTIVE:  Vitals:   04/11/17 1039  BP: 124/73  Pulse: 64  Temp: 98 F (36.7 C)  SpO2: 98%    General appearance: alert; no distress Extremities: no cyanosis or edema; symmetrical with no gross deformities Chest wall: upper anterior discomfort with palpation Back: tender over L trapezius distribution CV: normal extremity capillary refill Skin: warm and dry Neurologic: normal gait; normal symmetric reflexes in all extremities; normal sensation in all extremities Psychological: alert and cooperative; normal mood and  affect  Allergies  Allergen Reactions  . Codeine Nausea And Vomiting and Rash    Childhood reaction  . Crab [Shellfish Allergy] Rash  . Penicillins Nausea And Vomiting and Rash    Childhood reaction    Past Medical History:  Diagnosis Date  . Allergy    SEASONAL  . Anemia   . Heart murmur   . Migraine   . Obesity   . Osgood-Schlatter/osteochondroses    Social History   Socioeconomic History  . Marital status: Divorced    Spouse name: Not on file  . Number of children: Not on file  . Years of education: Not on file  . Highest education level: Not on file  Occupational History  . Not on file  Social Needs  . Financial resource strain: Not on file  . Food insecurity:    Worry: Not on file    Inability: Not on file  . Transportation needs:    Medical: Not on file    Non-medical: Not on file  Tobacco Use  . Smoking status: Never Smoker  . Smokeless tobacco: Never Used  Substance and Sexual Activity  . Alcohol use: No  . Drug use: No  . Sexual activity: Not on file  Lifestyle  . Physical activity:    Days per week: Not on file    Minutes per session: Not on file  . Stress: Not on file  Relationships  . Social connections:    Talks on phone: Not on file    Gets together: Not on file  Attends religious service: Not on file    Active member of club or organization: Not on file    Attends meetings of clubs or organizations: Not on file    Relationship status: Not on file  . Intimate partner violence:    Fear of current or ex partner: Not on file    Emotionally abused: Not on file    Physically abused: Not on file    Forced sexual activity: Not on file  Other Topics Concern  . 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 Diabetes Mother   . Hypertension Mother   . Hyperlipidemia Mother   . Hypertension Brother   . Hyperlipidemia Brother   . Diabetes Maternal Grandmother    Past Surgical History:  Procedure Laterality Date  . TUBAL LIGATION        Vanessa Kick, MD 04/11/17 1141

## 2017-04-11 NOTE — ED Triage Notes (Addendum)
Pt here for left upper back, shoulder, neck pain radiating into left arm. Denies injury she woke up this way. The pain is worse with moving. Reports that she walked last night.

## 2017-04-18 NOTE — Telephone Encounter (Signed)
Received Cologuard results. Noted to be negative.   A negative result indicates a low likelihood of colorectal cancer is present. Following a negative Cologuard result, the American Cancer Society recommends a Cologuard re-screening interval of 3 years.

## 2017-05-01 ENCOUNTER — Encounter: Payer: Self-pay | Admitting: *Deleted

## 2017-05-03 ENCOUNTER — Other Ambulatory Visit: Payer: Self-pay | Admitting: *Deleted

## 2017-05-03 MED ORDER — ONDANSETRON 4 MG PO TBDP: 4.0000 mg | ORAL_TABLET | Freq: Three times a day (TID) | ORAL | 2 refills | Status: DC | PRN

## 2017-05-07 ENCOUNTER — Encounter: Payer: Self-pay | Admitting: Family Medicine

## 2017-05-07 ENCOUNTER — Telehealth: Payer: Self-pay | Admitting: Family Medicine

## 2017-05-07 NOTE — Telephone Encounter (Signed)
Pt calling with ?'s about her NCIR.

## 2017-05-08 NOTE — Telephone Encounter (Signed)
Patient submitted MyChart message. Please see for further information.

## 2017-05-13 ENCOUNTER — Encounter: Payer: Self-pay | Admitting: Family Medicine

## 2017-06-04 ENCOUNTER — Encounter: Payer: Self-pay | Admitting: Family Medicine

## 2017-06-06 ENCOUNTER — Ambulatory Visit (INDEPENDENT_AMBULATORY_CARE_PROVIDER_SITE_OTHER): Payer: BLUE CROSS/BLUE SHIELD

## 2017-06-06 DIAGNOSIS — Z23 Encounter for immunization: Secondary | ICD-10-CM | POA: Diagnosis not present

## 2017-06-21 ENCOUNTER — Encounter: Payer: Self-pay | Admitting: Family Medicine

## 2017-06-21 ENCOUNTER — Ambulatory Visit: Payer: BLUE CROSS/BLUE SHIELD | Admitting: Family Medicine

## 2017-06-21 ENCOUNTER — Ambulatory Visit
Admission: RE | Admit: 2017-06-21 | Discharge: 2017-06-21 | Disposition: A | Discharge status: Home / Self Care | Payer: BLUE CROSS/BLUE SHIELD | Source: Ambulatory Visit | Attending: Family Medicine | Admitting: Family Medicine

## 2017-06-21 ENCOUNTER — Other Ambulatory Visit: Payer: Self-pay

## 2017-06-21 VITALS — BP 130/72 | HR 66 | Temp 97.9°F | Resp 14 | Ht 69.0 in | Wt 223.0 lb

## 2017-06-21 DIAGNOSIS — M1712 Unilateral primary osteoarthritis, left knee: Secondary | ICD-10-CM | POA: Diagnosis not present

## 2017-06-21 DIAGNOSIS — M545 Low back pain, unspecified: Secondary | ICD-10-CM

## 2017-06-21 DIAGNOSIS — M25562 Pain in left knee: Secondary | ICD-10-CM

## 2017-06-21 DIAGNOSIS — M1711 Unilateral primary osteoarthritis, right knee: Secondary | ICD-10-CM | POA: Diagnosis not present

## 2017-06-21 DIAGNOSIS — M25561 Pain in right knee: Secondary | ICD-10-CM

## 2017-06-21 DIAGNOSIS — G8929 Other chronic pain: Secondary | ICD-10-CM

## 2017-06-21 MED ORDER — CYCLOBENZAPRINE HCL 10 MG PO TABS: 10.0000 mg | ORAL_TABLET | Freq: Three times a day (TID) | ORAL | 1 refills | Status: DC

## 2017-06-21 NOTE — Patient Instructions (Signed)
Get the xrays done  F/U PENDING RESULTS  Use flexeril

## 2017-06-21 NOTE — Progress Notes (Signed)
   Subjective:    Patient ID: Rachel Cortez, female    DOB: [birth year], 52 y.o.   MRN: [id number]  Patient presents for B Knee Pain (states that she has pain on outer sides of knees when doing stairs) and Lower Back Pain (states that she has knots to lower back and pain that radiates down B legs)  Pt here with bilat knee pain and back pain.  She had knee pain for quite some time over the years but it is getting worse.  Her leg will often give out on her.  She will also get some pain in her lower back as well that will radiate down to her thigh depending on what is going on. Also feels spsasm in back  No change in bowel or bladder.  She will take over-the-counter only if severe.  She is trying to stay active and keep her weight down.  no recent injury    Review Of Systems:  GEN- denies fatigue, fever, weight loss,weakness, recent illness HEENT- denies eye drainage, change in vision, nasal discharge, CVS- denies chest pain, palpitations RESP- denies SOB, cough, wheeze ABD- denies N/V, change in stools, abd pain GU- denies dysuria, hematuria, dribbling, incontinence MSK- + joint pain, +muscle aches, injury Neuro- denies headache, dizziness, syncope, seizure activity       Objective:    BP 130/72   Pulse 66   Temp 97.9 F (36.6 C) (Oral)   Resp 14   Ht 5\' 9"  (1.753 m)   Wt 223 lb (101.2 kg)   LMP 08/08/2016   SpO2 97%   BMI 32.93 kg/m  GEN- NAD, alert and oriented x3 CVS- RRR, no murmur RESP-CTAB ABD-NABS,soft,NT,ND MSK- Spine NT, FROM, neg SLR, bilat knee + crepitus R> L, no effusion, ligaments appear in tact  EXT- No edema Pulses- Radial, DP- 2+        Assessment & Plan:      Problem List Items Addressed This Visit    None    Visit Diagnoses    Chronic pain of both knees    -  Primary   obtain xray, if DJd recommend ortho consult depending on severity. acn use NSAID/Tylenol or topical, stay active continue working on weight    Relevant Medications   cyclobenzaprine  (FLEXERIL) 10 MG tablet   Other Relevant Orders   DG Knee Complete 4 Views Left (Completed)   DG Knee Complete 4 Views Right (Completed)   Chronic bilateral low back pain without sciatica       obtain xray, no red flags, flexeril for spasm   Relevant Medications   cyclobenzaprine (FLEXERIL) 10 MG tablet   Other Relevant Orders   DG Lumbar Spine Complete (Completed)      Note: This dictation was prepared with Dragon dictation along with smaller phrase technology. Any transcriptional errors that result from this process are unintentional.

## 2017-06-24 ENCOUNTER — Other Ambulatory Visit: Payer: Self-pay | Admitting: *Deleted

## 2017-06-24 ENCOUNTER — Encounter: Payer: Self-pay | Admitting: Family Medicine

## 2017-06-24 DIAGNOSIS — M25562 Pain in left knee: Principal | ICD-10-CM

## 2017-06-24 DIAGNOSIS — M545 Low back pain, unspecified: Secondary | ICD-10-CM

## 2017-06-24 DIAGNOSIS — M25561 Pain in right knee: Principal | ICD-10-CM

## 2017-06-24 DIAGNOSIS — G8929 Other chronic pain: Secondary | ICD-10-CM

## 2017-07-02 ENCOUNTER — Other Ambulatory Visit: Payer: BLUE CROSS/BLUE SHIELD

## 2018-03-11 ENCOUNTER — Ambulatory Visit: Payer: BLUE CROSS/BLUE SHIELD | Admitting: Family Medicine

## 2018-04-02 ENCOUNTER — Encounter: Payer: BLUE CROSS/BLUE SHIELD | Admitting: Family Medicine

## 2018-04-16 ENCOUNTER — Ambulatory Visit: Payer: BLUE CROSS/BLUE SHIELD | Admitting: Family Medicine

## 2018-05-15 ENCOUNTER — Encounter: Payer: Self-pay | Admitting: Family Medicine

## 2018-05-15 NOTE — Telephone Encounter (Signed)
Ok to refill Flexeril? 

## 2018-05-16 NOTE — Telephone Encounter (Signed)
I believe this was sent to me in error.  It appears that the patient follows with Dr. Buelah Manis.  I have removed myself as PCP.  Thanks.

## 2018-05-19 MED ORDER — CYCLOBENZAPRINE HCL 10 MG PO TABS: 10.0000 mg | ORAL_TABLET | Freq: Three times a day (TID) | ORAL | 1 refills | Status: DC

## 2018-05-19 NOTE — Addendum Note (Signed)
Addended by: Sheral Flow on: 05/19/2018 09:19 AM   Modules accepted: Orders

## 2018-05-20 MED ORDER — ONDANSETRON 4 MG PO TBDP: 4.0000 mg | ORAL_TABLET | Freq: Three times a day (TID) | ORAL | 2 refills | Status: DC | PRN

## 2018-05-20 MED ORDER — RIZATRIPTAN BENZOATE 10 MG PO TABS: 10.0000 mg | ORAL_TABLET | ORAL | 2 refills | Status: DC | PRN

## 2018-05-20 NOTE — Addendum Note (Signed)
Addended by: Sheral Flow on: 05/20/2018 08:54 AM   Modules accepted: Orders

## 2018-08-25 ENCOUNTER — Other Ambulatory Visit: Payer: Self-pay

## 2018-08-25 ENCOUNTER — Ambulatory Visit (INDEPENDENT_AMBULATORY_CARE_PROVIDER_SITE_OTHER): Payer: Self-pay | Admitting: Family Medicine

## 2018-08-25 ENCOUNTER — Encounter: Payer: Self-pay | Admitting: Family Medicine

## 2018-08-25 ENCOUNTER — Ambulatory Visit: Payer: BLUE CROSS/BLUE SHIELD | Admitting: Family Medicine

## 2018-08-25 VITALS — BP 152/88 | HR 74 | Wt 228.2 lb

## 2018-08-25 DIAGNOSIS — M62838 Other muscle spasm: Secondary | ICD-10-CM | POA: Insufficient documentation

## 2018-08-25 DIAGNOSIS — R030 Elevated blood-pressure reading, without diagnosis of hypertension: Secondary | ICD-10-CM

## 2018-08-25 MED ORDER — BACLOFEN 10 MG PO TABS: 10.0000 mg | ORAL_TABLET | Freq: Three times a day (TID) | ORAL | 0 refills | Status: DC

## 2018-08-25 NOTE — Progress Notes (Signed)
    Subjective:  Rachel Cortez is a 53 y.o. female who presents to the [facility] today with a chief complaint of shoulder pain.   HPI: Muscle spasm Patient with history of chronic muscle spasms, has been using Flexeril in the past but did not like the sedating effects.  This is a chronic issue and there are no acute changes or new injury events that she can identify.  She has no motor/neurological complaints, there is no chest pain/SOB.  She has ran out since not having a primary care doctor for so long.  We discussed potential of using baclofen since that should avoid sedating effects.     Objective:  Physical Exam: BP (!) 152/88   Pulse 74   Wt 228 lb 3.2 oz (103.5 kg)   LMP 08/08/2016   SpO2 98%   BMI 33.70 kg/m   Gen: NAD, resting comfortably CV: RRR with no murmurs appreciated Pulm: NWOB, CTAB with no crackles, wheezes, or rhonchi GI: Normal bowel sounds present. Soft, Nontender, Nondistended. MSK: no edema, cyanosis, or clubbing noted.  Increased muscular tone along left shoulder/scapula, no distal neurological/vascular deficits identified.  No radicular pain Skin: warm, dry Neuro: grossly normal, moves all extremities Psych: Normal affect and thought content  No results found for this or any previous visit (from the past 72 hour(s)).   Assessment/Plan:  Muscle spasm Patient with history of chronic muscle spasms, has been using Flexeril in the past but did not like the sedating effects.  She has ran out since not having a primary care doctor for so long.  We discussed potential of using baclofen since that should avoid sedating effects.  There was increased tonicity along left shoulder/scapula  Patient does not have insurance and will not be able to afford physical therapy at this time, instructed to shop around for best price for baclofen (the meantime we will send to [facility]).  Instructed to do home yoga/stretching    Elevated BP without diagnosis of hypertension Single reading high  in office today 152/88, will not diagnose today as last was 130/72.  Will continue to monitor at followup visits   Sherene Sires, [place] - PGY2 08/26/2018 1:54 PM

## 2018-08-26 DIAGNOSIS — R030 Elevated blood-pressure reading, without diagnosis of hypertension: Secondary | ICD-10-CM | POA: Insufficient documentation

## 2018-08-26 NOTE — Assessment & Plan Note (Addendum)
Patient with history of chronic muscle spasms, has been using Flexeril in the past but did not like the sedating effects.  She has ran out since not having a primary care doctor for so long.  We discussed potential of using baclofen since that should avoid sedating effects.  There was increased tonicity along left shoulder/scapula  Patient does not have insurance and will not be able to afford physical therapy at this time, instructed to shop around for best price for baclofen (the meantime we will send to [facility]).  Instructed to do home yoga/stretching

## 2018-08-26 NOTE — Assessment & Plan Note (Signed)
Single reading high in office today 152/88, will not diagnose today as last was 130/72.  Will continue to monitor at followup visits

## 2018-09-01 ENCOUNTER — Telehealth: Payer: Self-pay | Admitting: *Deleted

## 2018-09-01 NOTE — Telephone Encounter (Signed)
Pt lm on nurse line that she had a knot on her finger and wanted to speak with someone.  Attempted to call back.  No answer, LMOVM for callback. Christen Bame, CMA

## 2018-10-07 ENCOUNTER — Encounter: Payer: Self-pay | Admitting: Family Medicine

## 2018-10-07 ENCOUNTER — Other Ambulatory Visit: Payer: Self-pay

## 2018-10-07 ENCOUNTER — Ambulatory Visit (INDEPENDENT_AMBULATORY_CARE_PROVIDER_SITE_OTHER): Payer: Self-pay | Admitting: Family Medicine

## 2018-10-07 VITALS — BP 142/82 | HR 79 | Wt 221.8 lb

## 2018-10-07 DIAGNOSIS — H6191 Disorder of right external ear, unspecified: Secondary | ICD-10-CM

## 2018-10-07 DIAGNOSIS — M25562 Pain in left knee: Secondary | ICD-10-CM

## 2018-10-07 DIAGNOSIS — M545 Low back pain, unspecified: Secondary | ICD-10-CM

## 2018-10-07 DIAGNOSIS — M152 Bouchard's nodes (with arthropathy): Secondary | ICD-10-CM

## 2018-10-07 DIAGNOSIS — G8929 Other chronic pain: Secondary | ICD-10-CM

## 2018-10-07 DIAGNOSIS — M25561 Pain in right knee: Secondary | ICD-10-CM

## 2018-10-07 DIAGNOSIS — R030 Elevated blood-pressure reading, without diagnosis of hypertension: Secondary | ICD-10-CM

## 2018-10-07 NOTE — Progress Notes (Signed)
Subjective: Chief Complaint  Patient presents with  . Knee Pain     HPI: Rachel Cortez is a 53 y.o. presenting to clinic today to discuss the following:  1 Left>Right Knee Pain Has history of arthritis in both knees.  No change in pain from previous.  Left knee gives out when walking up the stairs.  States "really bad pain, then goes out."  Pain constant throughout the day.  Rates pain 9/10.  Takes Tylenol and improves it some.  Bilateral knee XR 06/2017 with tricompartmental osteoarthritis bilaterally.  Wants to know non-invasive options for her pain.  She does not have insurance.  2 Back Muscle Spasms Chronic.  No acute changes or recent injuries.  Takes flexeril and does well with that.  Only takes at night because it makes her sleepy.  Couldn't afford baclofen.  Wants to continue with flexeril, but also wants to make sure that she is doing everything she can that is not invasive at this time.  She is working on applying for disability because this causing her pain throughout the day while she is working.  Has had a Lumbar XR in June 2019 that showed severe to space narrowing at L5-6, L6-S1 and facet osteoarthritis change bilaterally, no fx or spondylolisthesis noted.  3 Bump on Left Pointer Finger Notes that she has a "bump on her left pointer finger, not painful, just wondering what it is.  Not red or hot.  No other swelling noted aside from "hard bump."   4 Bump on right ear  Was told that she had a keloid on her ear.  States that there was a small bump on her ear a few months ago that she started to pick at when she was home and "nervous during quarantine."  Has created scabs that bleed when she picks at them, but otherwise was not ulcerating or bleeding.  Not painful or tender.    5 Blood pressure Notes that she checked her BP at home at it was 117/80 this AM.  Does not know why her blood pressure is so high here.  No chest pain, headache, changes in vision.  Health Maintenance:  declines flu shot today     ROS noted in HPI. Chief complaint noted.  Other Pertinent PMH: Obesity, HLD Past Medical, Surgical, Social, and Family History Reviewed & Updated per EMR.      Social History   Tobacco Use  Smoking Status Never Smoker  Smokeless Tobacco Never Used   Smoking status noted.    Objective: BP (!) 142/82   Pulse 79   Wt 221 lb 12.8 oz (100.6 kg)   LMP 08/08/2016   SpO2 98%   BMI 32.75 kg/m  Vitals and nursing notes reviewed  Physical Exam:  General: 53 y.o. female in NAD Ear: nodule with overlying scaling on right ear cartilage, see image below Lungs: Breathing comfortably on RA Skin: warm and dry Extremities: No edema Left hand: bony swelling at left 2nd digit PIP , no redness or TTP Knees: Right and left knee with obvious prominence of tibial tuberosity, no other visual deformity, crepitus with ROM bilaterally, sensation and strength intact, no obvious effusion palpated      No results found for this or any previous visit (from the past 72 hour(s)).  Assessment/Plan:  Chronic pain of both knees Pain likely 2/2 known osteoarthritis.  PFP could also be contributing.  Offered intra-articular steroid injection, but patient declines at this time.  Would like to start  with PT and then consider injections in the future if needed.  Not interested in surgery at this time.  Given patient does not have insurance, she was given handouts for exercises as she will likely not be given more than 1-2 sessions of PT.  Advised continued tylenol prn and encouraged weight loss.  Chronic bilateral low back pain without sciatica No changes in pain since imaging.  PT referral per above.  Also given back exercises to perform.  Can continue flexeril at night as she cannot afford baclofen and thinks that flexeril is working well.  No change in bowel or bladder habits, no BLE weakness.    Bouchard's node Left finger bump consistent with Bouchard's node, indicating  osteoarthritis.  Patient advised of this.  No pain, no further treatment needed at this time.  Skin lesion of right ear Does not appear to be obvious keloid in appearance.  Would recommend biopsy.  Given patient does not have insurance, will schedule for Derm clinic this week for biopsy to ensure is not cancerous.  Patient advised of this and agrees to appointment in 2 days at 2:10pm.  Elevated BP without diagnosis of hypertension Given patient's BP mildly elevated in office, but her insistence with good BP at home, advised to check at least twice daily and keep BP log.  Will follow up at next visit in 2 months and determine need for BP medication.  Also discussed weight loss as can lower BP.     PATIENT EDUCATION PROVIDED: See AVS    Diagnosis and plan along with any newly prescribed medication(s) were discussed in detail with this patient today. The patient verbalized understanding and agreed with the plan. Patient advised if symptoms worsen return to clinic or ER.   Health Maintainance: declines flu shot   Orders Placed This Encounter  Procedures  . Ambulatory referral to Physical Therapy    Referral Priority:   Routine    Referral Type:   Physical Medicine    Referral Reason:   Specialty Services Required    Requested Specialty:   Physical Therapy    Number of Visits Requested:   1    No orders of the defined types were placed in this encounter.    [place] Constable, DO 10/07/2018, 11:08 AM PGY-2 [place]

## 2018-10-07 NOTE — Patient Instructions (Signed)
Thank you for coming to see me today. It was a pleasure. Today we talked about:   Your knee and back pain: I have placed a referral to Physical Therapy.  If you do not hear from them in the next 2 weeks, please give [place] a call.  I have also given you exercises you can do at home.  Continue to take tylenol as needed.  Your ear: I would like for you to be seen for a possible biopsy.  We have a Dermatology clinic here that can see you.  You have an appointment on Thursday at 2:10pm.  Your blood pressure:  Continue to take it at least twice a day and keep a log so we can see if you need to be on blood pressure medication.  Please follow-up with me in 2 months or sooner as needed.  If you have any questions or concerns, please do not hesitate to call the office at [phone].  Best,   [place] Constable, DO

## 2018-10-07 NOTE — Assessment & Plan Note (Signed)
Left finger bump consistent with Bouchard's node, indicating osteoarthritis.  Patient advised of this.  No pain, no further treatment needed at this time.

## 2018-10-07 NOTE — Assessment & Plan Note (Signed)
Does not appear to be obvious keloid in appearance.  Would recommend biopsy.  Given patient does not have insurance, will schedule for Derm clinic this week for biopsy to ensure is not cancerous.  Patient advised of this and agrees to appointment in 2 days at 2:10pm.

## 2018-10-07 NOTE — Assessment & Plan Note (Signed)
Pain likely 2/2 known osteoarthritis.  PFP could also be contributing.  Offered intra-articular steroid injection, but patient declines at this time.  Would like to start with PT and then consider injections in the future if needed.  Not interested in surgery at this time.  Given patient does not have insurance, she was given handouts for exercises as she will likely not be given more than 1-2 sessions of PT.  Advised continued tylenol prn and encouraged weight loss.

## 2018-10-07 NOTE — Assessment & Plan Note (Signed)
Given patient's BP mildly elevated in office, but her insistence with good BP at home, advised to check at least twice daily and keep BP log.  Will follow up at next visit in 2 months and determine need for BP medication.  Also discussed weight loss as can lower BP.

## 2018-10-07 NOTE — Assessment & Plan Note (Signed)
No changes in pain since imaging.  PT referral per above.  Also given back exercises to perform.  Can continue flexeril at night as she cannot afford baclofen and thinks that flexeril is working well.  No change in bowel or bladder habits, no BLE weakness.

## 2018-10-09 ENCOUNTER — Ambulatory Visit: Payer: Medicaid Other

## 2018-10-15 ENCOUNTER — Other Ambulatory Visit: Payer: Self-pay

## 2018-10-15 ENCOUNTER — Ambulatory Visit: Payer: Self-pay | Attending: Family Medicine | Admitting: Physical Therapy

## 2018-10-15 DIAGNOSIS — G8929 Other chronic pain: Secondary | ICD-10-CM | POA: Insufficient documentation

## 2018-10-15 DIAGNOSIS — M545 Low back pain: Secondary | ICD-10-CM | POA: Insufficient documentation

## 2018-10-15 DIAGNOSIS — M25561 Pain in right knee: Secondary | ICD-10-CM | POA: Insufficient documentation

## 2018-10-15 DIAGNOSIS — M25562 Pain in left knee: Secondary | ICD-10-CM | POA: Insufficient documentation

## 2018-10-18 ENCOUNTER — Encounter: Payer: Self-pay | Admitting: Physical Therapy

## 2018-10-18 NOTE — Therapy (Signed)
[place], [place], [address] Phone: [phone]   Fax:  [phone]  Physical Therapy Evaluation  Patient Details  Name: Rachel Cortez MRN: [id number] Date of Birth: 12-28-1965 Referring Provider (PT): Dorris Singh   Encounter Date: 10/15/2018  PT End of Session - 10/18/18 1525    Visit Number  1    Number of Visits  12    Date for PT Re-Evaluation  11/26/18    Authorization Type  CAFA    PT Start Time  1048    PT Stop Time  1128    PT Time Calculation (min)  40 min    Activity Tolerance  Patient tolerated treatment well    Behavior During Therapy  [facility] for tasks assessed/performed       Past Medical History:  Diagnosis Date  . Allergy    SEASONAL  . Anemia   . Heart murmur   . Migraine   . Obesity   . Osgood-Schlatter/osteochondroses     Past Surgical History:  Procedure Laterality Date  . TUBAL LIGATION      There were no vitals filed for this visit.   Subjective Assessment - 10/18/18 1519    Subjective  Pt states increased back pain, chronic, but worsened in 2018 with accident. States increased pain bilaterally, constant. Denies radicular pain. She also states soreness in both knees, worsening lately. She has always liked to walk, used to walk up to 5 mi/day, now unable due to pain. She does not work ( due to covid)but is caring for daughter who just had a baby. has exercise bike at home.    Limitations  Standing;Walking;House hold activities    Diagnostic tests  Lumbar x-ray:  Severe discspace narrowing at L5-6 and L6-S1 //   Bil KNees:. Moderate tricompartmental degenerative osteoarthrosis.    Currently in Pain?  Yes    Pain Score  9     Pain Location  Back    Pain Orientation  Right;Left    Pain Descriptors / Indicators  Aching    Pain Type  Chronic pain    Pain Onset  More than a month ago    Pain Frequency  Constant    Aggravating Factors   laying flat, sitting, standing, bending     Pain Relieving Factors  nothing , pain up to 9/10    Pain Score  7    Pain Location  Knee    Pain Orientation  Right;Left    Pain Descriptors / Indicators  Aching    Pain Type  Chronic pain    Pain Radiating Towards  pain up to 10/10 with stairs    Pain Onset  More than a month ago    Pain Frequency  Intermittent    Aggravating Factors   kneeling, squatting, prolonged walking, stairs.    Pain Relieving Factors  none         [facility] PT Assessment - 10/18/18 0001      Assessment   Medical Diagnosis  Bil Knee pain and Back pain     Referring Provider (PT)  Dorris Singh    Prior Therapy  no      Balance Screen   Has the patient fallen in the past 6 months  No      Prior Function   Level of Independence  Independent      Cognition   Overall Cognitive Status  Within Functional Limits for tasks assessed      AROM  Overall AROM Comments  Bil Knees: WFL     AROM Assessment Site  Lumbar    Lumbar Flexion  Mod limitation    Lumbar Extension  Mild limitation    Lumbar - Right Side Bend  Mild limitation    Lumbar - Left Side Bend  mild limtiation      Strength   Overall Strength Comments  Hips: 4/5, Knees: 4/5;  Core: 3/5       Palpation   Palpation comment  Pain at lateral, medial and superior patella, bilaterally, mild pain at lateral joint line, no pain in posterior knee;   Back: Mild pain at bil SI and central low lumbar .       Special Tests   Other special tests  Neg SLR, Neg Mcmurray.                 Objective measurements completed on examination: See above findings.      [facility] Adult PT Treatment/Exercise - 10/18/18 0001      Exercises   Exercises  Knee/Hip;Lumbar      Lumbar Exercises: Stretches   Single Knee to Chest Stretch  3 reps;30 seconds    Lower Trunk Rotation  10 seconds;5 reps    Pelvic Tilt  20 reps      Knee/Hip Exercises: Seated   Long Arc Quad  10 reps;Both      Knee/Hip Exercises: Supine   Bridges  20 reps    Straight Leg  Raises  10 reps;Both      Knee/Hip Exercises: Sidelying   Hip ABduction  10 reps;Both               PT Short Term Goals - 10/18/18 1529      PT SHORT TERM GOAL #1   Title  Pt to be independent with initial HEP    Time  2    Period  Weeks    Status  New    Target Date  10/29/18      PT SHORT TERM GOAL #2   Title  Pt to report decreased pain in low back to 6/10 with activity    Time  2    Period  Weeks    Status  New    Target Date  10/29/18        PT Long Term Goals - 10/18/18 1533      PT LONG TERM GOAL #1   Title  Pt to report decreased pain in low back to 3/10 with activity    Time  6    Period  Weeks    Status  New    Target Date  11/26/18      PT LONG TERM GOAL #2   Title  Pt to report decreased pain in bil knees to 0-3/10 with activity    Time  6    Period  Weeks    Status  New    Target Date  11/26/18      PT LONG TERM GOAL #3   Title  Pt to demo improved strength of bil LEs and core to at least 4+/5 to improve stability and pain    Time  6    Period  Weeks    Status  New    Target Date  11/26/18      PT LONG TERM GOAL #4   Title  Pt to be indepedent with final HEP for back and knees    Time  6  Period  Weeks    Status  New    Target Date  11/26/18      PT LONG TERM GOAL #5   Title  Pt to report ability for exercise (walking) for at least 1 mile, with pain in knees 0-3/10    Time  6    Period  Weeks    Status  New    Target Date  11/26/18             Plan - 10/18/18 1517    Clinical Impression Statement  Pt presents with primary complaint of increased pain in bil knees and low back. Pt with ROM limitations for back, and decreased strength in hips, knees and core. Symptoms consitent with OA. Pt with decreased ability for full functional activities , ambulation, exercise, and IADls, due to pain. Pt to benefit from skilled PT to improve deficits and pain.    Personal Factors and Comorbidities  Comorbidity 1    Comorbidities  +  degenerative changes in back and Bil Knees.    Examination-Activity Limitations  Locomotion Level;Squat;Stairs;Hygiene/Grooming;Stand    Examination-Participation Restrictions  Cleaning;Community Activity;Shop    Stability/Clinical Decision Making  Stable/Uncomplicated    Clinical Decision Making  Low    Rehab Potential  Good    PT Frequency  2x / week    PT Duration  6 weeks    PT Treatment/Interventions  ADLs/Self Care Home Management;Cryotherapy;Electrical Stimulation;Ultrasound;Moist Heat;Iontophoresis 4mg /ml Dexamethasone;Gait training;Stair training;Functional mobility training;Therapeutic activities;Therapeutic exercise;Balance training;Neuromuscular re-education;Patient/family education;Manual techniques;Passive range of motion;Dry needling;Taping;Spinal Manipulations;Joint Manipulations;Orthotic Fit/Training    PT Home Exercise Plan  [id number]: Print new copy with this code.  Original code not saved.    Consulted and Agree with Plan of Care  Patient       Patient will benefit from skilled therapeutic intervention in order to improve the following deficits and impairments:  Abnormal gait, Pain, Increased muscle spasms, Decreased mobility, Decreased activity tolerance, Decreased endurance, Decreased range of motion, Decreased strength, Impaired flexibility, Difficulty walking  Visit Diagnosis: Chronic bilateral low back pain without sciatica  Chronic pain of right knee  Chronic pain of left knee     Problem List Patient Active Problem List   Diagnosis Date Noted  . Skin lesion of right ear 10/07/2018  . Chronic bilateral low back pain without sciatica 10/07/2018  . Chronic pain of both knees 10/07/2018  . Bouchard's node 10/07/2018  . Elevated BP without diagnosis of hypertension 08/26/2018  . Muscle spasm 08/25/2018  . Mass of right lower leg 07/31/2013  . Anemia, iron deficiency 12/11/2012  . Piriformis syndrome 05/06/2012  . Heart murmur, systolic [id number]  .  HYPERCHOLESTEROLEMIA 03/07/2006  . Obesity 03/07/2006   Lyndee Hensen, PT, DPT 3:47 PM  10/18/18    [facility] [address] [place], [place], [address] Phone: [phone]   Fax:  [phone]  Name: Hilah Jarrell MRN: [id number] Date of Birth: 1965/11/23

## 2018-10-18 NOTE — Patient Instructions (Signed)
Access Code: [email or web address]  URL: [email or web address]  Date: 10/18/2018  Prepared by: Lyndee Hensen   Exercises Supine Single Knee to Chest - 3 reps - 30 hold - 2x daily Supine Lower Trunk Rotation - 10 reps - 1 sets - 5 hold - 2x daily Supine Posterior Pelvic Tilt - 10 reps - 2 sets - 2x daily Supine Bridge - 10 reps - 2 sets - 1x daily Sidelying Hip Abduction - 10 reps - 2 sets - 1x daily Straight Leg Raise - 10 reps - 2 sets - 2x daily Seated Knee Extension AROM - 10 reps - 2 sets - 2x daily

## 2018-10-22 ENCOUNTER — Encounter: Payer: Self-pay | Admitting: Physical Therapy

## 2018-10-22 ENCOUNTER — Ambulatory Visit: Payer: Self-pay | Admitting: Physical Therapy

## 2018-10-22 ENCOUNTER — Other Ambulatory Visit: Payer: Self-pay

## 2018-10-22 DIAGNOSIS — M25561 Pain in right knee: Secondary | ICD-10-CM

## 2018-10-22 DIAGNOSIS — M25562 Pain in left knee: Secondary | ICD-10-CM

## 2018-10-22 DIAGNOSIS — G8929 Other chronic pain: Secondary | ICD-10-CM

## 2018-10-22 NOTE — Therapy (Signed)
[place], [place], [address] Phone: [phone]   Fax:  [phone]  Physical Therapy Treatment  Patient Details  Name: Rachel Cortez MRN: [id number] Date of Birth: 1965-05-03 Referring Provider (PT): Dorris Singh   Encounter Date: 10/22/2018  PT End of Session - 10/22/18 0757    Visit Number  2    Number of Visits  12    Date for PT Re-Evaluation  11/26/18    Authorization Type  CAFA    PT Start Time  0748    PT Stop Time  0830    PT Time Calculation (min)  42 min    Activity Tolerance  Patient tolerated treatment well    Behavior During Therapy  [facility] for tasks assessed/performed       Past Medical History:  Diagnosis Date  . Allergy    SEASONAL  . Anemia   . Heart murmur   . Migraine   . Obesity   . Osgood-Schlatter/osteochondroses     Past Surgical History:  Procedure Laterality Date  . TUBAL LIGATION      There were no vitals filed for this visit.  Subjective Assessment - 10/22/18 0753    Subjective  Pt states soreness after Eval. She has been able to do HEP, and has recumbent bike at home.    Currently in Pain?  Yes    Pain Score  7     Pain Location  Back    Pain Orientation  Right;Left    Pain Descriptors / Indicators  Aching    Pain Type  Chronic pain    Pain Onset  More than a month ago    Pain Frequency  Constant    Pain Score  9    Pain Location  Knee    Pain Orientation  Left    Pain Descriptors / Indicators  Aching    Pain Type  Chronic pain    Pain Onset  More than a month ago    Pain Frequency  Intermittent                       [facility] Adult PT Treatment/Exercise - 10/22/18 0758      Exercises   Exercises  Knee/Hip;Lumbar      Lumbar Exercises: Stretches   Single Knee to Chest Stretch  3 reps;30 seconds    Lower Trunk Rotation  10 seconds;5 reps    Pelvic Tilt  20 reps      Lumbar Exercises: Aerobic   Recumbent Bike  --      Lumbar  Exercises: Supine   Ab Set  10 reps    Bent Knee Raise  20 reps      Knee/Hip Exercises: Aerobic   Recumbent Bike  x8 min      Knee/Hip Exercises: Standing   Hip Flexion  20 reps;Knee bent      Knee/Hip Exercises: Seated   Long Arc Quad  Both;20 reps      Knee/Hip Exercises: Supine   Bridges  --    Straight Leg Raises  --      Knee/Hip Exercises: Sidelying   Hip ABduction  --      Manual Therapy   Manual Therapy  Soft tissue mobilization    Soft tissue mobilization  STM and IASTM to L distal/lateral quad                PT Short Term Goals - 10/18/18 1529  PT SHORT TERM GOAL #1   Title  Pt to be independent with initial HEP    Time  2    Period  Weeks    Status  New    Target Date  10/29/18      PT SHORT TERM GOAL #2   Title  Pt to report decreased pain in low back to 6/10 with activity    Time  2    Period  Weeks    Status  New    Target Date  10/29/18        PT Long Term Goals - 10/18/18 1533      PT LONG TERM GOAL #1   Title  Pt to report decreased pain in low back to 3/10 with activity    Time  6    Period  Weeks    Status  New    Target Date  11/26/18      PT LONG TERM GOAL #2   Title  Pt to report decreased pain in bil knees to 0-3/10 with activity    Time  6    Period  Weeks    Status  New    Target Date  11/26/18      PT LONG TERM GOAL #3   Title  Pt to demo improved strength of bil LEs and core to at least 4+/5 to improve stability and pain    Time  6    Period  Weeks    Status  New    Target Date  11/26/18      PT LONG TERM GOAL #4   Title  Pt to be indepedent with final HEP for back and knees    Time  6    Period  Weeks    Status  New    Target Date  11/26/18      PT LONG TERM GOAL #5   Title  Pt to report ability for exercise (walking) for at least 1 mile, with pain in knees 0-3/10    Time  6    Period  Weeks    Status  New    Target Date  11/26/18            Plan - 10/22/18 0943    Clinical Impression  Statement  Pt with soreness at L lateral and distal quad today, tightness addressed with IASTM, pt states decreased soreness after treament. Reviewed ther ex for HEP for correct mechanics. Pt with difficulty with neutral pelvis, pelvic tilt and abdominal contraction today. Plan to progress strength as tolerated.    Personal Factors and Comorbidities  Comorbidity 1    Comorbidities  + degenerative changes in back and Bil Knees.    Examination-Activity Limitations  Locomotion Level;Squat;Stairs;Hygiene/Grooming;Stand    Examination-Participation Restrictions  Cleaning;Community Activity;Shop    Stability/Clinical Decision Making  Stable/Uncomplicated    Rehab Potential  Good    PT Frequency  2x / week    PT Duration  6 weeks    PT Treatment/Interventions  ADLs/Self Care Home Management;Cryotherapy;Electrical Stimulation;Ultrasound;Moist Heat;Iontophoresis 4mg /ml Dexamethasone;Gait training;Stair training;Functional mobility training;Therapeutic activities;Therapeutic exercise;Balance training;Neuromuscular re-education;Patient/family education;Manual techniques;Passive range of motion;Dry needling;Taping;Spinal Manipulations;Joint Manipulations;Orthotic Fit/Training    PT Home Exercise Plan  [id number]: Print new copy with this code.  Original code not saved.    Consulted and Agree with Plan of Care  Patient       Patient will benefit from skilled therapeutic intervention in order to improve the following deficits and impairments:  Abnormal gait,  Pain, Increased muscle spasms, Decreased mobility, Decreased activity tolerance, Decreased endurance, Decreased range of motion, Decreased strength, Impaired flexibility, Difficulty walking  Visit Diagnosis: Chronic bilateral low back pain without sciatica  Chronic pain of right knee  Chronic pain of left knee     Problem List Patient Active Problem List   Diagnosis Date Noted  . Skin lesion of right ear 10/07/2018  . Chronic bilateral low back  pain without sciatica 10/07/2018  . Chronic pain of both knees 10/07/2018  . Bouchard's node 10/07/2018  . Elevated BP without diagnosis of hypertension 08/26/2018  . Muscle spasm 08/25/2018  . Mass of right lower leg 07/31/2013  . Anemia, iron deficiency 12/11/2012  . Piriformis syndrome 05/06/2012  . Heart murmur, systolic 123XX123  . HYPERCHOLESTEROLEMIA 03/07/2006  . Obesity 03/07/2006    Lyndee Hensen, PT, DPT 9:45 AM  10/22/18    [facility] [address] [place], [place], [address] Phone: [phone]   Fax:  [phone]  Name: Rachel Cortez MRN: [id number] Date of Birth: 03-17-1965

## 2018-10-29 ENCOUNTER — Other Ambulatory Visit: Payer: Self-pay

## 2018-10-29 ENCOUNTER — Encounter: Payer: Self-pay | Admitting: Physical Therapy

## 2018-10-29 ENCOUNTER — Ambulatory Visit: Payer: Self-pay | Admitting: Physical Therapy

## 2018-10-29 DIAGNOSIS — G8929 Other chronic pain: Secondary | ICD-10-CM

## 2018-10-29 DIAGNOSIS — M25561 Pain in right knee: Secondary | ICD-10-CM

## 2018-10-29 DIAGNOSIS — M25562 Pain in left knee: Secondary | ICD-10-CM

## 2018-10-29 DIAGNOSIS — M545 Low back pain: Secondary | ICD-10-CM

## 2018-10-29 NOTE — Therapy (Signed)
[place], [place], [address] Phone: [phone]   Fax:  [phone]  Physical Therapy Treatment  Patient Details  Name: Rachel Cortez MRN: [id number] Date of Birth: [birth year] Referring Provider (PT): Dorris Singh   Encounter Date: 10/29/2018  PT End of Session - 10/29/18 0757    Visit Number  3    Number of Visits  12    Date for PT Re-Evaluation  11/26/18    Authorization Type  CAFA    PT Start Time  0751    PT Stop Time  0830    PT Time Calculation (min)  39 min    Activity Tolerance  Patient tolerated treatment well    Behavior During Therapy  [facility] for tasks assessed/performed       Past Medical History:  Diagnosis Date  . Allergy    SEASONAL  . Anemia   . Heart murmur   . Migraine   . Obesity   . Osgood-Schlatter/osteochondroses     Past Surgical History:  Procedure Laterality Date  . TUBAL LIGATION      There were no vitals filed for this visit.  Subjective Assessment - 10/29/18 0755    Subjective  Pt states she has been able to do HEP , still has pain, but not as bad as previous. L knee and L side of back sore today.    Currently in Pain?  Yes    Pain Score  7     Pain Location  Back    Pain Orientation  Right;Left    Pain Descriptors / Indicators  Aching    Pain Type  Chronic pain    Pain Onset  More than a month ago    Pain Frequency  Constant    Multiple Pain Sites  Yes    Pain Score  9    Pain Location  Knee    Pain Orientation  Left    Pain Descriptors / Indicators  Aching    Pain Type  Chronic pain    Pain Onset  More than a month ago    Pain Frequency  Intermittent                       [facility] Adult PT Treatment/Exercise - 10/29/18 0757      Exercises   Exercises  Knee/Hip;Lumbar      Lumbar Exercises: Stretches   Single Knee to Chest Stretch  3 reps;30 seconds    Lower Trunk Rotation  --    Pelvic Tilt  20 reps    Piriformis Stretch  2 reps;30  seconds    Piriformis Stretch Limitations  supine      Lumbar Exercises: Supine   Ab Set  --    Bent Knee Raise  --      Knee/Hip Exercises: Stretches   Other Knee/Hip Stretches  Prone quad stretch 30 sec x2 bil;       Knee/Hip Exercises: Aerobic   Recumbent Bike  --    Nustep  x6 min  L5      Knee/Hip Exercises: Standing   Hip Flexion  --      Knee/Hip Exercises: Seated   Long Arc Quad  --      Knee/Hip Exercises: Supine   Bridges  20 reps      Manual Therapy   Manual Therapy  Soft tissue mobilization;Joint mobilization    Joint Mobilization  sacral mobs    Soft tissue  mobilization  IASTM to R distal quad,  DTM to Bil glute             PT Education - 10/29/18 0946    Person(s) Educated  Patient    Methods  Explanation;Demonstration    Comprehension  Verbalized understanding;Returned demonstration       PT Short Term Goals - 10/18/18 1529      PT SHORT TERM GOAL #1   Title  Pt to be independent with initial HEP    Time  2    Period  Weeks    Status  New    Target Date  10/29/18      PT SHORT TERM GOAL #2   Title  Pt to report decreased pain in low back to 6/10 with activity    Time  2    Period  Weeks    Status  New    Target Date  10/29/18        PT Long Term Goals - 10/18/18 1533      PT LONG TERM GOAL #1   Title  Pt to report decreased pain in low back to 3/10 with activity    Time  6    Period  Weeks    Status  New    Target Date  11/26/18      PT LONG TERM GOAL #2   Title  Pt to report decreased pain in bil knees to 0-3/10 with activity    Time  6    Period  Weeks    Status  New    Target Date  11/26/18      PT LONG TERM GOAL #3   Title  Pt to demo improved strength of bil LEs and core to at least 4+/5 to improve stability and pain    Time  6    Period  Weeks    Status  New    Target Date  11/26/18      PT LONG TERM GOAL #4   Title  Pt to be indepedent with final HEP for back and knees    Time  6    Period  Weeks    Status   New    Target Date  11/26/18      PT LONG TERM GOAL #5   Title  Pt to report ability for exercise (walking) for at least 1 mile, with pain in knees 0-3/10    Time  6    Period  Weeks    Status  New    Target Date  11/26/18            Plan - 10/29/18 0947    Clinical Impression Statement  Pt with stiffness noted in sacrum,as well as trigger points and tenderness in bil R>L glute. DTM done to improved today, pt may benefit from Dry needling in future sessions to improve this. Discussed stretching before and after she walks, for decreased pain in back. Plan to continue manual for pain and progress strength as tolerated.    Personal Factors and Comorbidities  Comorbidity 1    Comorbidities  + degenerative changes in back and Bil Knees.    Examination-Activity Limitations  Locomotion Level;Squat;Stairs;Hygiene/Grooming;Stand    Examination-Participation Restrictions  Cleaning;Community Activity;Shop    Stability/Clinical Decision Making  Stable/Uncomplicated    Rehab Potential  Good    PT Frequency  2x / week    PT Duration  6 weeks    PT Treatment/Interventions  ADLs/Self Care Home Management;Cryotherapy;Dealer  Stimulation;Ultrasound;Moist Heat;Iontophoresis 4mg /ml Dexamethasone;Gait training;Stair training;Functional mobility training;Therapeutic activities;Therapeutic exercise;Balance training;Neuromuscular re-education;Patient/family education;Manual techniques;Passive range of motion;Dry needling;Taping;Spinal Manipulations;Joint Manipulations;Orthotic Fit/Training    PT Home Exercise Plan  [id number]: Print new copy with this code.  Original code not saved.    Consulted and Agree with Plan of Care  Patient       Patient will benefit from skilled therapeutic intervention in order to improve the following deficits and impairments:  Abnormal gait, Pain, Increased muscle spasms, Decreased mobility, Decreased activity tolerance, Decreased endurance, Decreased range of motion, Decreased  strength, Impaired flexibility, Difficulty walking  Visit Diagnosis: Chronic bilateral low back pain without sciatica  Chronic pain of right knee  Chronic pain of left knee     Problem List Patient Active Problem List   Diagnosis Date Noted  . Skin lesion of right ear 10/07/2018  . Chronic bilateral low back pain without sciatica 10/07/2018  . Chronic pain of both knees 10/07/2018  . Bouchard's node 10/07/2018  . Elevated BP without diagnosis of hypertension 08/26/2018  . Muscle spasm 08/25/2018  . Mass of right lower leg 07/31/2013  . Anemia, iron deficiency 12/11/2012  . Piriformis syndrome 05/06/2012  . Heart murmur, systolic 123XX123  . HYPERCHOLESTEROLEMIA 03/07/2006  . Obesity 03/07/2006     Lyndee Hensen, PT, DPT 9:52 AM  10/29/18   [facility] [address] [place], [place], [address] Phone: [phone]   Fax:  [phone]  Name: Rachel Cortez MRN: [id number] Date of Birth: 1965-02-06

## 2018-10-31 ENCOUNTER — Ambulatory Visit: Payer: Self-pay | Admitting: Physical Therapy

## 2018-10-31 ENCOUNTER — Encounter: Payer: Self-pay | Admitting: Physical Therapy

## 2018-11-03 ENCOUNTER — Encounter: Payer: Self-pay | Admitting: Physical Therapy

## 2018-11-03 ENCOUNTER — Other Ambulatory Visit: Payer: Self-pay

## 2018-11-03 ENCOUNTER — Ambulatory Visit: Payer: Self-pay | Admitting: Physical Therapy

## 2018-11-03 DIAGNOSIS — M545 Low back pain: Secondary | ICD-10-CM

## 2018-11-03 DIAGNOSIS — G8929 Other chronic pain: Secondary | ICD-10-CM

## 2018-11-03 NOTE — Therapy (Signed)
[place], [place], [address] Phone: [phone]   Fax:  [phone]  Physical Therapy Treatment  Patient Details  Name: Rachel Cortez MRN: [id number] Date of Birth: 06-Oct-1965 Referring Provider (PT): Dorris Singh   Encounter Date: 11/03/2018  PT End of Session - 11/03/18 0851    Visit Number  4    Number of Visits  12    Date for PT Re-Evaluation  11/26/18    Authorization Type  CAFA    PT Start Time  0840    PT Stop Time  0930    PT Time Calculation (min)  50 min    Activity Tolerance  Patient tolerated treatment well    Behavior During Therapy  [facility] for tasks assessed/performed       Past Medical History:  Diagnosis Date  . Allergy    SEASONAL  . Anemia   . Heart murmur   . Migraine   . Obesity   . Osgood-Schlatter/osteochondroses     Past Surgical History:  Procedure Laterality Date  . TUBAL LIGATION      There were no vitals filed for this visit.  Subjective Assessment - 11/03/18 0847    Subjective  Pt arriving to therapy today reporting 9/10 pain in left Leg and 8/10 pain in low back. Pt reporting pain increased after last session for about 6 hours following.    Limitations  Standing;Walking;House hold activities    Diagnostic tests  Lumbar x-ray:  Severe discspace narrowing at L5-6 and L6-S1 //   Bil KNees:. Moderate tricompartmental degenerative osteoarthrosis.    Currently in Pain?  Yes    Pain Score  9     Pain Location  Leg    Pain Orientation  Left    Pain Descriptors / Indicators  Aching    Pain Type  Chronic pain    Pain Onset  More than a month ago    Multiple Pain Sites  No    Pain Score  8    Pain Location  Back    Pain Orientation  Lower    Pain Descriptors / Indicators  Aching    Pain Type  Chronic pain    Pain Onset  More than a month ago                       [facility] Adult PT Treatment/Exercise - 11/03/18 0001      Exercises   Exercises   Knee/Hip;Lumbar      Lumbar Exercises: Stretches   Single Knee to Chest Stretch  3 reps;30 seconds    Pelvic Tilt  20 reps    Piriformis Stretch  2 reps;30 seconds    Piriformis Stretch Limitations  supine      Knee/Hip Exercises: Stretches   Passive Hamstring Stretch  Both;2 reps;30 seconds    ITB Stretch  Both;2 reps;30 seconds      Knee/Hip Exercises: Aerobic   Nustep  x 9  min  L5      Knee/Hip Exercises: Supine   Bridges  15 reps      Modalities   Modalities  Moist Heat      Moist Heat Therapy   Number Minutes Moist Heat  10 Minutes    Moist Heat Location  Lumbar Spine      Manual Therapy   Manual Therapy  Soft tissue mobilization    Joint Mobilization  sacral mobs    Soft tissue mobilization  IASTM  to lumbar paraspinals, rolling left quad and IT band               PT Short Term Goals - 11/03/18 0939      PT SHORT TERM GOAL #1   Title  Pt to be independent with initial HEP    Time  2    Period  Weeks    Status  On-going    Target Date  10/29/18      PT SHORT TERM GOAL #2   Title  Pt to report decreased pain in low back to 6/10 with activity    Time  2    Period  Weeks    Status  On-going        PT Long Term Goals - 10/18/18 1533      PT LONG TERM GOAL #1   Title  Pt to report decreased pain in low back to 3/10 with activity    Time  6    Period  Weeks    Status  New    Target Date  11/26/18      PT LONG TERM GOAL #2   Title  Pt to report decreased pain in bil knees to 0-3/10 with activity    Time  6    Period  Weeks    Status  New    Target Date  11/26/18      PT LONG TERM GOAL #3   Title  Pt to demo improved strength of bil LEs and core to at least 4+/5 to improve stability and pain    Time  6    Period  Weeks    Status  New    Target Date  11/26/18      PT LONG TERM GOAL #4   Title  Pt to be indepedent with final HEP for back and knees    Time  6    Period  Weeks    Status  New    Target Date  11/26/18      PT LONG TERM  GOAL #5   Title  Pt to report ability for exercise (walking) for at least 1 mile, with pain in knees 0-3/10    Time  6    Period  Weeks    Status  New    Target Date  11/26/18            Plan - 11/03/18 0930    Clinical Impression Statement  Pt arriving to therpay reporting 9/10 pain in her left knee, 8/10 pain in low back more on the left side. Pt tolerating exercises well. Discussed adding hamstring stretche to pt's HEP. We also discussed DN for the next visit for her Low Back Pain. Continue skilled PT to progress toward goals set.    Comorbidities  + degenerative changes in back and Bil Knees.    Examination-Activity Limitations  Locomotion Level;Squat;Stairs;Hygiene/Grooming;Stand    Examination-Participation Restrictions  Cleaning;Community Activity;Shop    Stability/Clinical Decision Making  Stable/Uncomplicated    Rehab Potential  Good    PT Frequency  2x / week    PT Duration  6 weeks    PT Treatment/Interventions  ADLs/Self Care Home Management;Cryotherapy;Electrical Stimulation;Ultrasound;Moist Heat;Iontophoresis 4mg /ml Dexamethasone;Gait training;Stair training;Functional mobility training;Therapeutic activities;Therapeutic exercise;Balance training;Neuromuscular re-education;Patient/family education;Manual techniques;Passive range of motion;Dry needling;Taping;Spinal Manipulations;Joint Manipulations;Orthotic Fit/Training    PT Next Visit Plan  DN for low back    PT Home Exercise Plan  [id number]: Print new copy with this code.  Original code not saved.  Consulted and Agree with Plan of Care  Patient       Patient will benefit from skilled therapeutic intervention in order to improve the following deficits and impairments:  Abnormal gait, Pain, Increased muscle spasms, Decreased mobility, Decreased activity tolerance, Decreased endurance, Decreased range of motion, Decreased strength, Impaired flexibility, Difficulty walking  Visit Diagnosis: Chronic pain of right  knee  Chronic pain of left knee  Chronic bilateral low back pain without sciatica     Problem List Patient Active Problem List   Diagnosis Date Noted  . Skin lesion of right ear 10/07/2018  . Chronic bilateral low back pain without sciatica 10/07/2018  . Chronic pain of both knees 10/07/2018  . Bouchard's node 10/07/2018  . Elevated BP without diagnosis of hypertension 08/26/2018  . Muscle spasm 08/25/2018  . Mass of right lower leg 07/31/2013  . Anemia, iron deficiency 12/11/2012  . Piriformis syndrome 05/06/2012  . Heart murmur, systolic 123XX123  . HYPERCHOLESTEROLEMIA 03/07/2006  . Obesity 03/07/2006    Oretha Caprice 11/03/2018, 9:42 AM  Kearney Hard, PT 11/03/18 9:42 AM     [facility] [address] [place], [place], [address] Phone: [phone]   Fax:  [phone]  Name: Denaly Osuch MRN: [id number] Date of Birth: Aug 19, 1965

## 2018-11-05 ENCOUNTER — Other Ambulatory Visit: Payer: Self-pay

## 2018-11-05 ENCOUNTER — Ambulatory Visit: Payer: Self-pay | Admitting: Physical Therapy

## 2018-11-05 ENCOUNTER — Encounter: Payer: Self-pay | Admitting: Physical Therapy

## 2018-11-05 DIAGNOSIS — M25562 Pain in left knee: Secondary | ICD-10-CM

## 2018-11-05 DIAGNOSIS — M25561 Pain in right knee: Secondary | ICD-10-CM

## 2018-11-05 DIAGNOSIS — G8929 Other chronic pain: Secondary | ICD-10-CM

## 2018-11-05 NOTE — Therapy (Signed)
[place], [place], [address] Phone: [phone]   Fax:  [phone]  Physical Therapy Treatment  Patient Details  Name: Rachel Cortez MRN: [id number] Date of Birth: 1965-08-12 Referring Provider (PT): Dorris Singh   Encounter Date: 11/05/2018  PT End of Session - 11/05/18 1008    Visit Number  5    Number of Visits  12    Date for PT Re-Evaluation  11/26/18    Authorization Type  CAFA    PT Start Time  0747    PT Stop Time  0832    PT Time Calculation (min)  45 min    Activity Tolerance  Patient tolerated treatment well    Behavior During Therapy  [facility] for tasks assessed/performed       Past Medical History:  Diagnosis Date  . Allergy    SEASONAL  . Anemia   . Heart murmur   . Migraine   . Obesity   . Osgood-Schlatter/osteochondroses     Past Surgical History:  Procedure Laterality Date  . TUBAL LIGATION      There were no vitals filed for this visit.  Subjective Assessment - 11/05/18 1002    Subjective  Pt states knees and glute pain doing ok today, Central low back is sore. She has been able to walk, up to 30 min, every other day. States pain is variable. Not interested in dry needling today.    Currently in Pain?  Yes    Pain Score  4     Pain Location  Leg    Pain Orientation  Left;Right    Pain Descriptors / Indicators  Aching    Pain Type  Chronic pain    Pain Onset  More than a month ago    Pain Frequency  Constant    Multiple Pain Sites  Yes    Pain Score  5    Pain Location  Back    Pain Orientation  Lower    Pain Descriptors / Indicators  Aching    Pain Type  Chronic pain    Pain Onset  More than a month ago    Pain Frequency  Intermittent                       [facility] Adult PT Treatment/Exercise - 11/05/18 0753      Exercises   Exercises  Knee/Hip;Lumbar      Lumbar Exercises: Stretches   Single Knee to Chest Stretch  30 seconds;2 reps    Lower Trunk  Rotation  10 seconds;5 reps    Pelvic Tilt  20 reps    Piriformis Stretch  2 reps;30 seconds    Piriformis Stretch Limitations  supine      Knee/Hip Exercises: Stretches   Passive Hamstring Stretch  --    ITB Stretch  --      Knee/Hip Exercises: Aerobic   Nustep  x 8  min  L5      Knee/Hip Exercises: Standing   Hip Abduction  10 reps;2 sets      Knee/Hip Exercises: Supine   Bridges  --    Straight Leg Raises  10 reps;Both;2 sets    Other Supine Knee/Hip Exercises  Modified crunch x20      Modalities   Modalities  Moist Heat      Moist Heat Therapy   Moist Heat Location  --      Manual Therapy   Manual Therapy  Soft tissue mobilization;Passive ROM    Joint Mobilization  scaral mobs    Soft tissue mobilization  DTM to bil glute/glute med and piriformis.     Passive ROM  Manual stretching for piriformis               PT Short Term Goals - 11/03/18 0939      PT SHORT TERM GOAL #1   Title  Pt to be independent with initial HEP    Time  2    Period  Weeks    Status  On-going    Target Date  10/29/18      PT SHORT TERM GOAL #2   Title  Pt to report decreased pain in low back to 6/10 with activity    Time  2    Period  Weeks    Status  On-going        PT Long Term Goals - 10/18/18 1533      PT LONG TERM GOAL #1   Title  Pt to report decreased pain in low back to 3/10 with activity    Time  6    Period  Weeks    Status  New    Target Date  11/26/18      PT LONG TERM GOAL #2   Title  Pt to report decreased pain in bil knees to 0-3/10 with activity    Time  6    Period  Weeks    Status  New    Target Date  11/26/18      PT LONG TERM GOAL #3   Title  Pt to demo improved strength of bil LEs and core to at least 4+/5 to improve stability and pain    Time  6    Period  Weeks    Status  New    Target Date  11/26/18      PT LONG TERM GOAL #4   Title  Pt to be indepedent with final HEP for back and knees    Time  6    Period  Weeks    Status  New     Target Date  11/26/18      PT LONG TERM GOAL #5   Title  Pt to report ability for exercise (walking) for at least 1 mile, with pain in knees 0-3/10    Time  6    Period  Weeks    Status  New    Target Date  11/26/18            Plan - 11/05/18 1010    Clinical Impression Statement  Pt with increased lordosis in standing, discussed stretching for flexion before and after walks. Reviewed stetches for HEP, pt still with questions on correct performance. Recommended increased pirformis stretching for glute pain, has difficulty wiht this due to knee pain. Recommended DN for glute tightness but pt declines today. Plan to progress as tolerated.    Comorbidities  + degenerative changes in back and Bil Knees.    Examination-Activity Limitations  Locomotion Level;Squat;Stairs;Hygiene/Grooming;Stand    Examination-Participation Restrictions  Cleaning;Community Activity;Shop    Stability/Clinical Decision Making  Stable/Uncomplicated    Rehab Potential  Good    PT Frequency  2x / week    PT Duration  6 weeks    PT Treatment/Interventions  ADLs/Self Care Home Management;Cryotherapy;Electrical Stimulation;Ultrasound;Moist Heat;Iontophoresis 4mg /ml Dexamethasone;Gait training;Stair training;Functional mobility training;Therapeutic activities;Therapeutic exercise;Balance training;Neuromuscular re-education;Patient/family education;Manual techniques;Passive range of motion;Dry needling;Taping;Spinal Manipulations;Joint Manipulations;Orthotic Fit/Training  PT Next Visit Plan  DN for low back    PT Home Exercise Plan  [phone]: Print new copy with this code.  Original code not saved.    Consulted and Agree with Plan of Care  Patient       Patient will benefit from skilled therapeutic intervention in order to improve the following deficits and impairments:  Abnormal gait, Pain, Increased muscle spasms, Decreased mobility, Decreased activity tolerance, Decreased endurance, Decreased range of motion,  Decreased strength, Impaired flexibility, Difficulty walking  Visit Diagnosis: Chronic pain of left knee  Chronic bilateral low back pain without sciatica  Chronic pain of right knee     Problem List Patient Active Problem List   Diagnosis Date Noted  . Skin lesion of right ear 10/07/2018  . Chronic bilateral low back pain without sciatica 10/07/2018  . Chronic pain of both knees 10/07/2018  . Bouchard's node 10/07/2018  . Elevated BP without diagnosis of hypertension 08/26/2018  . Muscle spasm 08/25/2018  . Mass of right lower leg 07/31/2013  . Anemia, iron deficiency 12/11/2012  . Piriformis syndrome 05/06/2012  . Heart murmur, systolic [id number]  . HYPERCHOLESTEROLEMIA 03/07/2006  . Obesity 03/07/2006    Lyndee Hensen, PT, DPT 10:17 AM  11/05/18    [facility] [address] [place], [place], [address] Phone: [phone]   Fax:  [phone]  Name: Brycelyn Rak MRN: [id number] Date of Birth: 30-Jul-1965

## 2018-11-11 ENCOUNTER — Ambulatory Visit: Payer: Self-pay | Attending: Family Medicine | Admitting: Physical Therapy

## 2018-11-11 ENCOUNTER — Encounter: Payer: Self-pay | Admitting: Physical Therapy

## 2018-11-11 ENCOUNTER — Other Ambulatory Visit: Payer: Self-pay

## 2018-11-11 DIAGNOSIS — M25561 Pain in right knee: Secondary | ICD-10-CM | POA: Insufficient documentation

## 2018-11-11 DIAGNOSIS — M25562 Pain in left knee: Secondary | ICD-10-CM | POA: Insufficient documentation

## 2018-11-11 DIAGNOSIS — M545 Low back pain: Secondary | ICD-10-CM | POA: Insufficient documentation

## 2018-11-11 DIAGNOSIS — G8929 Other chronic pain: Secondary | ICD-10-CM | POA: Insufficient documentation

## 2018-11-11 NOTE — Patient Instructions (Signed)
Trigger Point Dry Needling  . What is Trigger Point Dry Needling (DN)? o DN is a physical therapy technique used to treat muscle pain and dysfunction. Specifically, DN helps deactivate muscle trigger points (muscle knots).  o A thin filiform needle is used to penetrate the skin and stimulate the underlying trigger point. The goal is for a local twitch response (LTR) to occur and for the trigger point to relax. No medication of any kind is injected during the procedure.   . What Does Trigger Point Dry Needling Feel Like?  o The procedure feels different for each individual patient. Some patients report that they do not actually feel the needle enter the skin and overall the process is not painful. Very mild bleeding may occur. However, many patients feel a deep cramping in the muscle in which the needle was inserted. This is the local twitch response.   Marland Kitchen How Will I feel after the treatment? o Soreness is normal, and the onset of soreness may not occur for a few hours. Typically this soreness does not last longer than two days.  o Bruising is uncommon, however; ice can be used to decrease any possible bruising.  o In rare cases feeling tired or nauseous after the treatment is normal. In addition, your symptoms may get worse before they get better, this period will typically not last longer than 24 hours.   . What Can I do After My Treatment? o Increase your hydration by drinking more water for the next 24 hours. o You may place ice or heat on the areas treated that have become sore, however, do not use heat on inflamed or bruised areas. Heat often brings more relief post needling. o You can continue your regular activities, but vigorous activity is not recommended initially after the treatment for 24 hours. DN is best combined with other physical therapy such as strengthening, stretching, and other therapies.             Facing step, place one leg on step, flexed at hip. Step up slowly,  bringing hips in line with knee and shoulder. Bring other foot onto step. Reverse process to step back down. Repeat with other leg. Do _10 x3___ repetitions,  1-2 x a day  http://bt.exer.us/154   Copyright  VHI. All rights reserved.  Voncille Lo, PT Certified Exercise Expert for the Aging Adult  11/11/18 8:28 AM Phone: [phone] Fax: [phone]

## 2018-11-11 NOTE — Therapy (Signed)
[place], [place], [address] Phone: [phone]   Fax:  [phone]  Physical Therapy Treatment  Patient Details  Name: Rachel Cortez MRN: [id number] Date of Birth: Feb 25, 1965 Referring Provider (PT): Dorris Singh   Encounter Date: 11/11/2018  PT End of Session - 11/11/18 0911    Visit Number  6    Number of Visits  12    Date for PT Re-Evaluation  11/26/18    Authorization Type  CAFA    PT Start Time  0801    PT Stop Time  0900    PT Time Calculation (min)  59 min    Activity Tolerance  Patient tolerated treatment well    Behavior During Therapy  [facility] for tasks assessed/performed       Past Medical History:  Diagnosis Date  . Allergy    SEASONAL  . Anemia   . Heart murmur   . Migraine   . Obesity   . Osgood-Schlatter/osteochondroses     Past Surgical History:  Procedure Laterality Date  . TUBAL LIGATION      There were no vitals filed for this visit.  Subjective Assessment - 11/11/18 0805    Subjective  Pt states her pain is in both her knees and the center of back.  Both knees have arthritis and it is hard to go up and down steps. I feel like my problems have not been dealt with so I am discouraged    Limitations  Standing;Walking;House hold activities    How long can you sit comfortably?  10-15 minutes    How long can you stand comfortably?  20 minutes    How long can you walk comfortably?  20 minutes    Diagnostic tests  Lumbar x-ray:  Severe discspace narrowing at L5-6 and L6-S1 //   Bil KNees:. Moderate tricompartmental degenerative osteoarthrosis.    Patient Stated Goals  decrease pain and I would love to get back to walking 5 miles or at least daily    Currently in Pain?  Yes    Pain Score  9     Pain Location  Leg    Pain Orientation  Left;Right   L worse than right  RT 7/10   Pain Descriptors / Indicators  Aching    Pain Type  Chronic pain    Pain Onset  More than a month ago     Pain Frequency  Constant    Aggravating Factors   laying flat, sitting , standing, bending    Pain Score  9    Pain Location  Back    Pain Orientation  Lower    Pain Descriptors / Indicators  Stabbing;Jabbing    Pain Onset  More than a month ago    Pain Frequency  Constant    Aggravating Factors   kneeling, squatting , prolonged walking , stairs                       [facility] Adult PT Treatment/Exercise - 11/11/18 0824      Self-Care   Self-Care  Other Self-Care Comments    Other Self-Care Comments   education on TPDN aftercare, education on exercise progression , DOMS      Exercises   Exercises  Knee/Hip;Lumbar      Lumbar Exercises: Stretches   Lower Trunk Rotation  10 seconds;5 reps    Pelvic Tilt  20 reps      Lumbar Exercises: Standing  Other Standing Lumbar Exercises  sink squat x 20    no pain in knees     Knee/Hip Exercises: Stretches   Hip Flexor Stretch Limitations  standing hip flexor/quad stretch in chair  RT and LT 3 x 30 sec hold each      Knee/Hip Exercises: Aerobic   Nustep  x 8  min  L5      Knee/Hip Exercises: Standing   Forward Step Up  Hand Hold: 2;3 sets;Both;Step Height: 6"    Forward Step Up Limitations  3 x 10      Modalities   Modalities  Moist Heat      Moist Heat Therapy   Number Minutes Moist Heat  10 Minutes    Moist Heat Location  Lumbar Spine      Manual Therapy   Manual Therapy  Soft tissue mobilization;Passive ROM    Soft tissue mobilization  DTM to bil glute/glute med /RT piriformis/RT lumbar SI       Trigger Point Dry Needling - 11/11/18 0001    Consent Given?  Yes    Education Handout Provided  Yes    Muscles Treated Back/Hip  Piriformis;Gluteus maximus;Erector spinae   RT only   Dry Needling Comments  60 mm    Gluteus Maximus Response  Twitch response elicited;Palpable increased muscle length    Piriformis Response  Twitch response elicited;Palpable increased muscle length    Erector spinae Response   Palpable increased muscle length   L5/S1          PT Education - 11/11/18 0831    Education Details  Educated on TPDN and added       PT Short Term Goals - 11/11/18 0855      PT SHORT TERM GOAL #1   Title  Pt to be independent with initial HEP    Baseline  Pt given initial HEP and moving on to advanced HEP    Time  2    Period  Weeks    Status  Achieved    Target Date  10/29/18      PT SHORT TERM GOAL #2   Title  Pt to report decreased pain in low back to 6/10 with activity    Baseline  Started at 9/10 today and ended in 4/10 after TPDN    Time  2    Period  Weeks    Status  On-going    Target Date  10/29/18        PT Long Term Goals - 11/11/18 0908      PT LONG TERM GOAL #1   Title  Pt to report decreased pain in low back to 3/10 with activity    Baseline  Started 9/10 today and ended 4/10    Time  6    Period  Weeks    Status  On-going    Target Date  11/26/18      PT LONG TERM GOAL #2   Title  Pt to report decreased pain in bil knees to 0-3/10 with activity    Baseline  9/10 to 4/10 today after RX    Time  6    Period  Weeks    Status  On-going    Target Date  11/26/18      PT LONG TERM GOAL #3   Title  Pt to demo improved strength of bil LEs and core to at least 4+/5 to improve stability and pain    Time  6  Period  Weeks    Status  On-going    Target Date  11/26/18      PT LONG TERM GOAL #4   Title  Pt to be indepedent with final HEP for back and knees    Baseline  gave 3 exericises today to HEP toward Advanced HEP    Time  6    Period  Weeks    Target Date  11/26/18      PT LONG TERM GOAL #5   Title  Pt to report ability for exercise (walking) for at least 1 mile, with pain in knees 0-3/10    Baseline  Unable to walk for longer than 20 minute today    Time  6    Period  Weeks    Status  On-going    Target Date  11/26/18            Plan - 11/11/18 0913    Clinical Impression Statement  Pt entered clinic with 9/10 pain and 9/10  pain in LT knee/7/10 RT knee.  Pt with complaints of not feeling like she had had the right PT.  Pt educated on exericise progression and sensitivity of her tissues.  Pt educated on TPDN and she consented to RX.  Pt was closely monitored thoughout session.  Pt also advanced HEP to more challengin/loading exericses and was explained DOMS and that she will initially be sore but will eventrually strengthen.  She stands with increased lordotic curve and was reminded to do basics of exercise already given.  Will continue POC and continue loading and strengthening.    Personal Factors and Comorbidities  Comorbidity 1    Comorbidities  + degenerative changes in back and Bil Knees.    Examination-Activity Limitations  Locomotion Level;Squat;Stairs;Hygiene/Grooming;Stand    Examination-Participation Restrictions  Cleaning;Community Activity;Shop    Stability/Clinical Decision Making  Stable/Uncomplicated    Clinical Decision Making  Low    Rehab Potential  Good    PT Frequency  2x / week    PT Duration  6 weeks    PT Treatment/Interventions  ADLs/Self Care Home Management;Cryotherapy;Electrical Stimulation;Ultrasound;Moist Heat;Iontophoresis 4mg /ml Dexamethasone;Gait training;Stair training;Functional mobility training;Therapeutic activities;Therapeutic exercise;Balance training;Neuromuscular re-education;Patient/family education;Manual techniques;Passive range of motion;Dry needling;Taping;Spinal Manipulations;Joint Manipulations;Orthotic Fit/Training    PT Next Visit Plan  assess DN for low back  loading , KB use.  knee step ups lateral and step downs as able  Check Goals    PT Home Exercise Plan  [id number]: Print new copy with this code.  sink squats, step ups, standing hip flexor/ quad stretch on stair    Consulted and Agree with Plan of Care  Patient       Patient will benefit from skilled therapeutic intervention in order to improve the following deficits and impairments:  Abnormal gait, Pain, Increased  muscle spasms, Decreased mobility, Decreased activity tolerance, Decreased endurance, Decreased range of motion, Decreased strength, Impaired flexibility, Difficulty walking  Visit Diagnosis: Chronic pain of left knee  Chronic bilateral low back pain without sciatica  Chronic pain of right knee     Problem List Patient Active Problem List   Diagnosis Date Noted  . Skin lesion of right ear 10/07/2018  . Chronic bilateral low back pain without sciatica 10/07/2018  . Chronic pain of both knees 10/07/2018  . Bouchard's node 10/07/2018  . Elevated BP without diagnosis of hypertension 08/26/2018  . Muscle spasm 08/25/2018  . Mass of right lower leg 07/31/2013  . Anemia, iron deficiency 12/11/2012  . Piriformis syndrome  05/06/2012  . Heart murmur, systolic [id number]  . HYPERCHOLESTEROLEMIA 03/07/2006  . Obesity 03/07/2006   Voncille Lo, PT Certified Exercise Expert for the Aging Adult  11/11/18 9:19 AM Phone: [phone] Fax: [phone]  [facility] [address] [place], [place], [address] Phone: [phone]   Fax:  [phone]  Name: Kayton Ussery MRN: [id number] Date of Birth: 12-Aug-1965

## 2018-11-14 ENCOUNTER — Encounter: Payer: Self-pay | Admitting: Physical Therapy

## 2018-11-14 ENCOUNTER — Ambulatory Visit: Payer: Self-pay | Admitting: Physical Therapy

## 2018-11-14 ENCOUNTER — Other Ambulatory Visit: Payer: Self-pay

## 2018-11-14 DIAGNOSIS — M25562 Pain in left knee: Secondary | ICD-10-CM

## 2018-11-14 DIAGNOSIS — G8929 Other chronic pain: Secondary | ICD-10-CM

## 2018-11-14 DIAGNOSIS — M25561 Pain in right knee: Secondary | ICD-10-CM

## 2018-11-14 DIAGNOSIS — M545 Low back pain, unspecified: Secondary | ICD-10-CM

## 2018-11-14 NOTE — Patient Instructions (Addendum)
    Strengthening: Wall Slide   Leaning on wall, slowly lower buttocks until thighs are parallel to floor. Hold 10____ seconds. Tighten thigh muscles and return. Repeat __10__ times per set. Do _1___ sets per session. Do _1-2___ sessions per day.    Voncille Lo, PT Certified Exercise Expert for the Aging Adult  11/14/18 8:17 AM Phone: [phone] Fax: [phone]

## 2018-11-14 NOTE — Therapy (Addendum)
[place], [place], [address] Phone: [phone]   Fax:  [phone]  Physical Therapy Treatment/Discharge Note  Patient Details  Name: Rachel Cortez MRN: [id number] Date of Birth: 03/05/1965 Referring Provider (PT): Dorris Singh   Encounter Date: 11/14/2018  PT End of Session - 11/14/18 0808    Visit Number  7    Number of Visits  12    Date for PT Re-Evaluation  11/26/18    Authorization Type  CAFA    PT Start Time  0801    PT Stop Time  0905    PT Time Calculation (min)  64 min    Activity Tolerance  Patient tolerated treatment well    Behavior During Therapy  [facility] for tasks assessed/performed       Past Medical History:  Diagnosis Date  . Allergy    SEASONAL  . Anemia   . Heart murmur   . Migraine   . Obesity   . Osgood-Schlatter/osteochondroses     Past Surgical History:  Procedure Laterality Date  . TUBAL LIGATION      There were no vitals filed for this visit.  Subjective Assessment - 11/14/18 0810    Subjective  I really felt sore all over my body but my right back felt better after the TPDN.  My RT side is hurting today and my knee caps because of the stepping.  When I get up from a chair or my bed. I really feel  a sharp painin my LT (SI) but my LT back hurts so much    Limitations  Standing;Walking;House hold activities    How long can you sit comfortably?  10-15 minutes    How long can you stand comfortably?  20 minutes    How long can you walk comfortably?  20 minutes    Diagnostic tests  Lumbar x-ray:  Severe discspace narrowing at L5-6 and L6-S1 //   Bil KNees:. Moderate tricompartmental degenerative osteoarthrosis.    Patient Stated Goals  decrease pain and I would love to get back to walking 5 miles or at least daily    Currently in Pain?  Yes    Pain Score  8    0/10 Mc Connell taping   Pain Location  Leg    Pain Orientation  Left;Right    Pain Descriptors / Indicators   Stabbing;Sore    Pain Type  Chronic pain    Pain Onset  More than a month ago    Pain Frequency  Constant    Pain Score  9    Pain Location  Back    Pain Orientation  Lower;Left    Pain Descriptors / Indicators  Aching    Pain Type  Chronic pain    Pain Onset  More than a month ago    Pain Frequency  Constant         [facility] PT Assessment - 11/14/18 0001      Assessment   Medical Diagnosis  Bil Knee pain and Back pain     Referring Provider (PT)  Carina Brown      ROM / Strength   AROM / PROM / Strength  AROM;Strength      AROM   Overall AROM Comments  Bil Knees: WFL     AROM Assessment Site  Thumb    Lumbar Flexion  WFL   able to touch ankles bil   Lumbar Extension  Mild limitation    Lumbar -  Right Side Bend  Mild limitation    Lumbar - Left Side [facility]      Strength   Overall Strength Comments  Hips: 4/5, Knees: 4+/5;  Core: 4-/5      Palpation   Palpation comment  Pain more pronounced in lateral LT knee but slight pain at med, sup patella and jt line                   [facility] Adult PT Treatment/Exercise - 11/14/18 0001      Self-Care   Self-Care  Other Self-Care Comments    Other Self-Care Comments   iniital education on walking program/Mc Connell taping/self taping      Exercises   Exercises  Knee/Hip;Lumbar      Lumbar Exercises: Stretches   Single Knee to Chest Stretch  30 seconds;2 reps    Lower Trunk Rotation  10 seconds;5 reps    Piriformis Stretch  2 reps;30 seconds    Piriformis Stretch Limitations  supine      Lumbar Exercises: Standing   Other Standing Lumbar Exercises  sink squat x 10 , no pain in knees with tape      Lumbar Exercises: Supine   Ab Set  10 reps    AB Set Limitations  x2    Pelvic Tilt  10 reps    Pelvic Tilt Limitations  x3    Bridge with [employer]  10 reps    Bridge with [employer] Limitations  x 3 with ab engagement      Knee/Hip Exercises: Aerobic   Nustep  x6 min L5      Modalities   Modalities   Moist Heat      Moist Heat Therapy   Number Minutes Moist Heat  12 Minutes    Moist Heat Location  Lumbar Spine   LT QL/ buttocks/back     Manual Therapy   Manual Therapy  Taping    Soft tissue mobilization  STW to LT gluts, piriformis, QL left only    McConnell  lateral to med pull LT knee    0/10 pain with movement      Trigger Point Dry Needling - 11/14/18 0001    Consent Given?  Yes    Education Handout Provided  Previously provided    Muscles Treated Back/Hip  Piriformis;Gluteus maximus;Erector spinae;Quadratus lumborum   LT only   Dry Needling Comments  75 mm    Gluteus Maximus Response  Palpable increased muscle length    Piriformis Response  Twitch response elicited;Palpable increased muscle length    Erector spinae Response  Palpable increased muscle length   L5 S1 LT only   Quadratus Lumborum Response  Twitch response elicited;Palpable increased muscle length           PT Education - 11/14/18 0836    Education Details  educated on [employer] taping,  added to [employer]) Educated  Patient    Methods  Explanation;Demonstration;Tactile cues;Verbal cues;Handout    Comprehension  Verbalized understanding;Returned demonstration       PT Short Term Goals - 11/14/18 0818      PT SHORT TERM GOAL #1   Title  Pt to be independent with initial HEP    Baseline  Pt given initial HEP and moving on to advanced HEP    Time  2    Period  Weeks    Status  Achieved    Target Date  10/29/18  PT SHORT TERM GOAL #2   Title  Pt to report decreased pain in low back to 6/10 with activity    Baseline  9/10 at beginning of session today    Time  2    Period  Weeks    Status  On-going    Target Date  10/29/18        PT Long Term Goals - 11/14/18 0818      PT LONG TERM GOAL #1   Title  Pt to report decreased pain in low back to 3/10 with activity    Baseline  Started 8/10 today and ended 3/10    Time  6    Period  Weeks    Status  On-going    Target  Date  11/26/18      PT LONG TERM GOAL #2   Title  Pt to report decreased pain in bil knees to 0-3/10 with activity    Baseline  Pt with 8/10  and 0/10 post McConnell taping on LT    Time  6    Period  Weeks    Status  On-going    Target Date  11/26/18      PT LONG TERM GOAL #3   Title  Pt to demo improved strength of bil LEs and core to at least 4+/5 to improve stability and pain    Baseline  hips 4/5, knees 4+/5, core 4-/5    Time  6    Period  Weeks    Status  On-going    Target Date  11/26/18      PT LONG TERM GOAL #4   Title  Pt to be indepedent with final HEP for back and knees    Baseline  working on advanced HEP for home use    Time  6    Period  Weeks    Status  On-going    Target Date  11/26/18      PT LONG TERM GOAL #5   Title  Pt to report ability for exercise (walking) for at least 1 mile, with pain in knees 0-3/10    Baseline  Pt has not been walking this week, was encouraged to walk with knee taping in place    Time  6    Period  Weeks    Status  Unable to assess    Target Date  11/26/18            Plan - 11/14/18 0911    Clinical Impression Statement  Pt returns to clinic with 8/10 pain in back and knees.  Pt at 8:00 am appt and after doing exericises decreases pain.  LT knee with Cherly Anderson tape and was 0/10 pain with exercise during session.  Pt with LT sided back pain and consented to [facility] which had good result last time.  Pt reports 0/10 after session.  Rachel Cortez is encouraged to begin walking daily for exericise and will be checked next visit. She has 2 more visits to solidify HEP.  Pt has improved lumbar motion in flexion and LT side bend to [facility] and able to bend forward and touch ankles bil.  Pt strength in knees improved to 4+/5, back 4/5 and core 4-/5 Will reassess after 2 more visits.    Personal Factors and Comorbidities  Comorbidity 1    Comorbidities  + degenerative changes in back and Bil Knees.    Examination-Activity Limitations  Locomotion  Level;Squat;Stairs;Hygiene/Grooming;Stand    Examination-Participation Restrictions  Cleaning;Community  Activity;Shop    Stability/Clinical Decision Making  Stable/Uncomplicated    Clinical Decision Making  Low    Rehab Potential  Good    PT Frequency  2x / week    PT Treatment/Interventions  ADLs/Self Care Home Management;Cryotherapy;Electrical Stimulation;Ultrasound;Moist Heat;Iontophoresis 28m/ml Dexamethasone;Gait training;Stair training;Functional mobility training;Therapeutic activities;Therapeutic exercise;Balance training;Neuromuscular re-education;Patient/family education;Manual techniques;Passive range of motion;Dry needling;Taping;Spinal Manipulations;Joint Manipulations;Orthotic Fit/Training    PT Next Visit Plan  use of KB/laundry basket, given lateral step ups and deadlifts and have pt do Mc connell taping    PT Home Exercise Plan  [id number] Print new copy with this code.  sink squats, step ups, standing hip flexor/ quad stretch on stair wall sit and bridge with ball squeeze    Consulted and Agree with Plan of Care  Patient       Patient will benefit from skilled therapeutic intervention in order to improve the following deficits and impairments:  Abnormal gait, Pain, Increased muscle spasms, Decreased mobility, Decreased activity tolerance, Decreased endurance, Decreased range of motion, Decreased strength, Impaired flexibility, Difficulty walking  Visit Diagnosis: Chronic pain of left knee  Chronic bilateral low back pain without sciatica  Chronic pain of right knee     Problem List Patient Active Problem List   Diagnosis Date Noted  . Skin lesion of right ear 10/07/2018  . Chronic bilateral low back pain without sciatica 10/07/2018  . Chronic pain of both knees 10/07/2018  . Bouchard's node 10/07/2018  . Elevated BP without diagnosis of hypertension 08/26/2018  . Muscle spasm 08/25/2018  . Mass of right lower leg 07/31/2013  . Anemia, iron deficiency 12/11/2012  .  Piriformis syndrome 05/06/2012  . Heart murmur, systolic 083/81/8403 . HYPERCHOLESTEROLEMIA 03/07/2006  . Obesity 03/07/2006    LVoncille Lo PT Certified Exercise Expert for the Aging Adult  11/14/18 9:17 AM Phone: [phone]: 3[facility] [address]Phone: [phone]  Fax:  [phone] Name: DKayliana CoddMRN: [id number] of Birth: [birth year]  PHYSICAL THERAPY DISCHARGE SUMMARY  Visits from Start of Care: 7  Current functional level related to goals / functional outcomes: unknown   Remaining deficits: Residual pain as of last visit,  Otherwise unknown   Education / Equipment: iniital HEP Plan:                                                    Patient goals were not met. Patient is being discharged due to not returning since the last visit.  ?????    LVoncille Lo PT Certified Exercise Expert for the Aging Adult  02/24/19 12:57 PM Phone: [phone]: [phone]

## 2018-11-19 ENCOUNTER — Encounter: Payer: Self-pay | Admitting: Family Medicine

## 2018-11-19 ENCOUNTER — Ambulatory Visit: Payer: Self-pay | Admitting: Physical Therapy

## 2018-11-25 ENCOUNTER — Ambulatory Visit: Payer: Self-pay | Admitting: Physical Therapy

## 2018-12-02 ENCOUNTER — Ambulatory Visit: Payer: Self-pay | Admitting: Physical Therapy

## 2018-12-30 ENCOUNTER — Encounter: Payer: Self-pay | Admitting: Family Medicine

## 2018-12-30 ENCOUNTER — Ambulatory Visit (INDEPENDENT_AMBULATORY_CARE_PROVIDER_SITE_OTHER): Payer: Self-pay | Admitting: Family Medicine

## 2018-12-30 ENCOUNTER — Other Ambulatory Visit: Payer: Self-pay

## 2018-12-30 VITALS — BP 142/80 | HR 78 | Temp 98.2°F | Wt 223.0 lb

## 2018-12-30 DIAGNOSIS — Z711 Person with feared health complaint in whom no diagnosis is made: Secondary | ICD-10-CM

## 2018-12-30 DIAGNOSIS — H6191 Disorder of right external ear, unspecified: Secondary | ICD-10-CM

## 2018-12-30 DIAGNOSIS — M25561 Pain in right knee: Secondary | ICD-10-CM

## 2018-12-30 DIAGNOSIS — M545 Low back pain, unspecified: Secondary | ICD-10-CM

## 2018-12-30 DIAGNOSIS — G8929 Other chronic pain: Secondary | ICD-10-CM

## 2018-12-30 DIAGNOSIS — M25562 Pain in left knee: Secondary | ICD-10-CM

## 2018-12-30 NOTE — Assessment & Plan Note (Signed)
Discussed with patient that this lesion is concerning for possible cancer, including basal cell carcinoma.  Advised that biopsy would be recommended.  She is still very hesitant and notes that she has had keloids formed previously after she has had biopsies at this clinic.  Did advise the patient that if this is cancerous, and nothing is done about it, she could end up losing a large portion of her ear.  She voiced understanding of this.  She notes that she will think about this and consider coming back for a biopsy.

## 2018-12-30 NOTE — Progress Notes (Signed)
Subjective: Chief Complaint  Patient presents with  . Knee Pain  . Back Pain     HPI: Rachel Cortez is a 53 y.o. presenting to clinic today to discuss the following:  1 Left>Right Knee Pain Therapy was helping some, but afterwards, would have pain.  Was doing some exercises at home, but is not doing this as much right now.  Still taking tylenol, 1000 mg daily.  Rates pain 9/10.  Has tried voltaren gel in the past and has improved with this, but hasn't used recently.  Last imaging 06/2017 noted moderate tricompartmental osteoarthritis in right and left knees.  2 Back Pain  Tries not to take muscle relaxers very much because they make her sleepy.  Last imaging 06/2017, noted severe space narrowing at L5-6 and L6-S1.  Pain has not changed in nature since that time per patient.  Denies any numbness and tingling of the groin, no numbness and tingling in the legs, no bowel or bladder habit changes.  Denies any lower extremity weakness.  States that she had some improvement after dry needling, but would not like to go back to physical therapy.  She does not have insurance, does not want to pay for this.  States that she has been doing some exercises for core strengthening and stretching that she was shown at physical therapy, but has not been very good with this recently.  3 Bump on right ear Concern at previous visit raised for patient's bump on her right ear.  She was scheduled for Derm clinic at that time, but does not appear to have gone.  Patient states that she was worried that it was because the scar that she would not like.  She stated that she is not that worried about it.  4 Blood Pressure Patient reports that she has been taking her blood pressure, brings log with her today.  Blood pressure log reveals systolic blood pressures within the low 100s, highest 123.  Reports that she was concerned that she had hypertension and wants to make sure that she does not and that these blood pressures  are stable.  She denies any symptoms of hypertension, including headache, chest pain, changes in vision.  Health Maintenance: Given mammogram form     ROS noted in HPI. Chief complaint noted.  Other Pertinent PMH: Obesity, hyperlipidemia Past Medical, Surgical, Social, and Family History Reviewed & Updated per EMR.      Social History   Tobacco Use  Smoking Status Never Smoker  Smokeless Tobacco Never Used   Smoking status noted.    Objective: BP (!) 142/80   Pulse 78   Temp 98.2 F (36.8 C) (Oral)   Wt 223 lb (101.2 kg)   LMP 08/08/2016   SpO2 98%   BMI 32.93 kg/m  Vitals and nursing notes reviewed  Physical Exam:  General: 53 y.o. female in NAD Lungs: breathing comfortably on room air, no increased work of breathing Skin: warm and dry Extremities: No edema, moves all 4 extremities equally   No results found for this or any previous visit (from the past 72 hour(s)).  Assessment/Plan:  Chronic bilateral low back pain without sciatica Reassured that this is unchanged in nature.  Advised patient to continue with exercises given to her at physical therapy.  Continue to encourage exercise and weight loss.  Discussed with her going to [facility] for water aerobics that this would help with both back pain and knee pain.  She states that she is very  excited about this and will look into it.  Also advised that she can continue to use Tylenol as needed and can use Voltaren gel on her back.  Given that she does not have insurance, and no red flag symptoms, and pain is stable from previous imaging in 06/2017, would not proceed with MRI at this time as it would not likely change management.  Chronic pain of both knees Pain stable, did have some improvement with physical therapy, but has not been doing exercise very much.  Complicated by not having insurance.  Discussed treatment options with patient including another referral to physical therapy, which she declined.  She declines  corticosteroid injections at this time, discussed risks and benefits with her.  Discussed that this may or may not help with her pain in the short-term.  Also discussed that she will likely need bilateral knee replacements in the future given the extent of her osteoarthritis in 2019.  She reports that she would like to hold off with corticosteroid injections at this time, patient given more information regarding this.  Advised to continue with therapy exercises and try water aerobics.  Also to continue Tylenol and Voltaren gel.  Briefly discussed with patient starting tramadol for short-term, but she opts not to do this at this time.  She is to follow-up in the next few months if she decides for corticosteroid injections.  Skin lesion of right ear Discussed with patient that this lesion is concerning for possible cancer, including basal cell carcinoma.  Advised that biopsy would be recommended.  She is still very hesitant and notes that she has had keloids formed previously after she has had biopsies at this clinic.  Did advise the patient that if this is cancerous, and nothing is done about it, she could end up losing a large portion of her ear.  She voiced understanding of this.  She notes that she will think about this and consider coming back for a biopsy.  Worried well Discussed with patient that while her blood pressure is mildly elevated here in the office, her blood pressure log at home shows perfect blood pressures.  Reassured that she does not have a true diagnosis of hypertension.  We will continue to monitor at her visits, if this concern arises again, can continue to do home blood pressure monitoring.     PATIENT EDUCATION PROVIDED: See AVS    Diagnosis and plan along with any newly prescribed medication(s) were discussed in detail with this patient today. The patient verbalized understanding and agreed with the plan. Patient advised if symptoms worsen return to clinic or ER.   Health  Maintainance: Given mammogram form, next visit should have lipid panel   No orders of the defined types were placed in this encounter.   No orders of the defined types were placed in this encounter.    [place] Constable, DO 12/30/2018, 1:55 PM PGY-2 [place]

## 2018-12-30 NOTE — Assessment & Plan Note (Signed)
Reassured that this is unchanged in nature.  Advised patient to continue with exercises given to her at physical therapy.  Continue to encourage exercise and weight loss.  Discussed with her going to [facility] for water aerobics that this would help with both back pain and knee pain.  She states that she is very excited about this and will look into it.  Also advised that she can continue to use Tylenol as needed and can use Voltaren gel on her back.  Given that she does not have insurance, and no red flag symptoms, and pain is stable from previous imaging in 06/2017, would not proceed with MRI at this time as it would not likely change management.

## 2018-12-30 NOTE — Assessment & Plan Note (Signed)
Discussed with patient that while her blood pressure is mildly elevated here in the office, her blood pressure log at home shows perfect blood pressures.  Reassured that she does not have a true diagnosis of hypertension.  We will continue to monitor at her visits, if this concern arises again, can continue to do home blood pressure monitoring.

## 2018-12-30 NOTE — Assessment & Plan Note (Signed)
Pain stable, did have some improvement with physical therapy, but has not been doing exercise very much.  Complicated by not having insurance.  Discussed treatment options with patient including another referral to physical therapy, which she declined.  She declines corticosteroid injections at this time, discussed risks and benefits with her.  Discussed that this may or may not help with her pain in the short-term.  Also discussed that she will likely need bilateral knee replacements in the future given the extent of her osteoarthritis in 2019.  She reports that she would like to hold off with corticosteroid injections at this time, patient given more information regarding this.  Advised to continue with therapy exercises and try water aerobics.  Also to continue Tylenol and Voltaren gel.  Briefly discussed with patient starting tramadol for short-term, but she opts not to do this at this time.  She is to follow-up in the next few months if she decides for corticosteroid injections.

## 2018-12-30 NOTE — Patient Instructions (Addendum)
Thank you for coming to see me today. It was a pleasure. Today we talked about:   Knee pain: Continue to do your exercises.  Look into doing water aerobics.  We will get the information below about steroid injections in your knee and consider coming back for this.  Pick up Voltaren gel from the pharmacy or [facility] and use this on your knees.  Continue to use Tylenol as needed.  Back pain: Continue to the exercise that you were given, you can also use the Voltaren gel on this area.  Blood pressure: Your blood pressure log looks great today, I do not have any concerns about this.  Bump on your ear: Consider coming back and make an appoint with her dermatology clinic for a biopsy of this.  If you have any questions or concerns, please do not hesitate to call the office at [phone].  Best,   Arizona Constable, DO    Knee Injection A knee injection is a procedure to get medicine into your knee joint to relieve the pain, swelling, and stiffness of arthritis. Your health care provider uses a needle to inject medicine, which may also help to lubricate and cushion your knee joint. You may need more than one injection. Tell a health care provider about:  Any allergies you have.  All medicines you are taking, including vitamins, herbs, eye drops, creams, and over-the-counter medicines.  Any problems you or family members have had with anesthetic medicines.  Any blood disorders you have.  Any surgeries you have had.  Any medical conditions you have.  Whether you are pregnant or may be pregnant. What are the risks? Generally, this is a safe procedure. However, problems may occur, including:  Infection.  Bleeding.  Symptoms that get worse.  Damage to the area around your knee.  Allergic reaction to any of the medicines.  Skin reactions from repeated injections. What happens before the procedure?  Ask your health care provider about changing or stopping your regular  medicines. This is especially important if you are taking diabetes medicines or blood thinners.  Plan to have someone take you home from the hospital or clinic. What happens during the procedure?   You will sit or lie down in a position for your knee to be treated.  The skin over your kneecap will be cleaned with a germ-killing soap.  You will be given a medicine that numbs the area (local anesthetic). You may feel some stinging.  The medicine will be injected into your knee. The needle is carefully placed between your kneecap and your knee. The medicine is injected into the joint space.  The needle will be removed at the end of the procedure.  A bandage (dressing) may be placed over the injection site. The procedure may vary among health care providers and hospitals. What can I expect after the procedure?  Your blood pressure, heart rate, breathing rate, and blood oxygen level will be monitored until you leave the hospital or clinic.  You may have to move your knee through its full range of motion. This helps to get all the medicine into your joint space.  You will be watched to make sure that you do not have a reaction to the injected medicine.  You may feel more pain, swelling, and warmth than you did before the injection. This reaction may last about 1-2 days. Follow these instructions at home: Medicines  Take over-the-counter and prescription medicines only as told by your doctor.  Do not  drive or use heavy machinery while taking prescription pain medicine.  Do not take medicines such as aspirin and ibuprofen unless your health care provider tells you to take them. Injection site care  Follow instructions from your health care provider about: ? How to take care of your puncture site. ? When and how you should change your dressing. ? When you should remove your dressing.  Check your injection area every day for signs of infection. Check for: ? More redness, swelling, or  pain after 2 days. ? Fluid or blood. ? Pus or a bad smell. ? Warmth. Managing pain, stiffness, and swelling   If directed, put ice on the injection area: ? Put ice in a plastic bag. ? Place a towel between your skin and the bag. ? Leave the ice on for 20 minutes, 2-3 times per day.  Do not apply heat to your knee.  Raise (elevate) the injection area above the level of your heart while you are sitting or lying down. General instructions  If you were given a dressing, keep it dry until your health care provider says it can be removed. Ask your health care provider when you can start showering or taking a bath.  Avoid strenuous activities for as long as directed by your health care provider. Ask your health care provider when you can return to your normal activities.  Keep all follow-up visits as told by your health care provider. This is important. You may need more injections. Contact a health care provider if you have:  A fever.  Warmth in your injection area.  Fluid, blood, or pus coming from your injection site.  Symptoms at your injection site that last longer than 2 days after your procedure. Get help right away if:  Your knee: ? Turns very red. ? Becomes very swollen. ? Is in severe pain. Summary  A knee injection is a procedure to get medicine into your knee joint to relieve the pain, swelling, and stiffness of arthritis.  A needle is carefully placed between your kneecap and your knee to inject medicine into the joint space.  Before the procedure, ask your health care provider about changing or stopping your regular medicines, especially if you are taking diabetes medicines or blood thinners.  Contact your health care provider if you have any problems or questions after your procedure. This information is not intended to replace advice given to you by your health care provider. Make sure you discuss any questions you have with your health care provider. Document  Released: 03/18/2006 Document Revised: 01/14/2017 Document Reviewed: 01/14/2017 Elsevier Patient Education  2020 [employer].

## 2019-02-20 ENCOUNTER — Other Ambulatory Visit: Payer: Self-pay | Admitting: Family Medicine

## 2019-02-24 ENCOUNTER — Other Ambulatory Visit: Payer: Self-pay | Admitting: Family Medicine

## 2019-02-24 ENCOUNTER — Telehealth: Payer: Self-pay

## 2019-02-24 DIAGNOSIS — M545 Low back pain, unspecified: Secondary | ICD-10-CM

## 2019-02-24 DIAGNOSIS — G8929 Other chronic pain: Secondary | ICD-10-CM

## 2019-02-24 MED ORDER — CYCLOBENZAPRINE HCL 10 MG PO TABS: 10.0000 mg | ORAL_TABLET | Freq: Every evening | ORAL | 1 refills | Status: DC | PRN

## 2019-02-24 NOTE — Telephone Encounter (Signed)
Rx sent 

## 2019-02-24 NOTE — Progress Notes (Signed)
Rx sent for flexeril at patient request as baclofen not covered.

## 2019-02-24 NOTE — Telephone Encounter (Signed)
Patient calls nurse line requesting refill for flexeril. Patient states that medication was switched to baclofen for insurance reasons but would like to be back on flexeril. If appropriate, please send [facility] to [facility] on [address]   To PCP  Talbot Grumbling, RN

## 2019-03-09 ENCOUNTER — Encounter: Payer: Self-pay | Admitting: Family Medicine

## 2019-03-21 ENCOUNTER — Encounter: Payer: Self-pay | Admitting: Family Medicine

## 2019-06-19 ENCOUNTER — Telehealth: Payer: Self-pay

## 2019-06-19 NOTE — Telephone Encounter (Signed)
Patient calls nurse line stating she is still waiting to hear back about financial assistance. Will forward to [place].

## 2019-06-22 ENCOUNTER — Encounter: Payer: Self-pay | Admitting: Family Medicine

## 2019-06-22 NOTE — Telephone Encounter (Signed)
Pt does not qualify for any Financial assistance through [facility]. [place] office mailed her a letter on 05/19/19 explaining that she does not qualify at this time. I will call the patient also to let her know also. jw

## 2019-07-22 ENCOUNTER — Ambulatory Visit: Payer: Self-pay | Admitting: Family Medicine

## 2019-07-22 ENCOUNTER — Telehealth: Payer: Self-pay | Admitting: *Deleted

## 2019-07-22 ENCOUNTER — Other Ambulatory Visit: Payer: Self-pay

## 2019-07-22 NOTE — Telephone Encounter (Signed)
Opened in error.Rachel Cortez, CMA  

## 2019-08-26 ENCOUNTER — Telehealth: Payer: Self-pay | Admitting: *Deleted

## 2019-08-26 NOTE — Telephone Encounter (Signed)
Patient states that she has been approved with orange card but is confused as to why we don't "take" it here at our office.  I explained to patient that [facility] orange card is a discount given to specialist not under the [facility] system.  Patient needs to apply for [facility] financial assistance in order to have our office visits covered.  I explained to patient that she can contact patient accounting to ask about this part of the application. I went ahead and mailed patient a new financial assistance form and asked her to mail it back to address on the form.  Patient voiced understanding and will do that. Rachel Cortez,CMA

## 2019-10-07 ENCOUNTER — Ambulatory Visit (INDEPENDENT_AMBULATORY_CARE_PROVIDER_SITE_OTHER): Payer: Self-pay | Admitting: Family Medicine

## 2019-10-07 ENCOUNTER — Other Ambulatory Visit: Payer: Self-pay

## 2019-10-07 ENCOUNTER — Encounter: Payer: Self-pay | Admitting: Family Medicine

## 2019-10-07 VITALS — BP 132/84 | HR 66 | Ht 69.0 in | Wt 224.0 lb

## 2019-10-07 DIAGNOSIS — M25561 Pain in right knee: Secondary | ICD-10-CM

## 2019-10-07 DIAGNOSIS — G8929 Other chronic pain: Secondary | ICD-10-CM

## 2019-10-07 DIAGNOSIS — M25562 Pain in left knee: Secondary | ICD-10-CM

## 2019-10-07 NOTE — Progress Notes (Signed)
    SUBJECTIVE:   CHIEF COMPLAINT / HPI:   Chronic Bilateral Knee Pain Pain is worse when going up the steps Has been having pain for years Last films 06/2017 with moderate tricompartmental arthritis b/l Has been doing exercises at home  Did PT last year Has not had any improvement Weight has been stable Can't walk like she wants to Does the bike  Takes tylenol, muscle relaxers, voltaren gel and continues to have worsening Pain is 7/10, constant Feels like she is wobbling, but might also have to do with back pain She was told that she needs an orthopedic referral for possible disability insurance She recently got insurance, but it is in the [place]  PMH / PSH: Chronic bilateral knee pain with osteoarthritis, chronic bilateral low back pain without sciatica, iron deficiency anemia, obesity  OBJECTIVE:   BP 132/84   Pulse 66   Ht 5\' 9"  (1.753 m)   Wt 224 lb (101.6 kg)   LMP 08/08/2016   SpO2 100%   BMI 33.08 kg/m    Physical Exam:  General: 54 y.o. female in NAD Lungs: Breathing comfortably on room air Skin: warm and dry  Bilateral knee: - Inspection: Obvious swelling to right tibial tuberosity greater than left, with numerous bony prominences over bilateral patella. No swelling/effusion, erythema or bruising. Skin intact - Palpation: no TTP of right knee, mild tenderness to palpation of left lateral joint line - ROM: full active ROM with flexion and extension in knee and hip, crepitus palpated bilaterally, pain with extremes of flexion bilaterally - Strength: 5/5 strength bilaterally - Neuro/vasc: NV intact bilaterally - Special Tests: - LIGAMENTS: negative anterior and posterior drawer, negative Lachman's, no MCL or LCL laxity bilaterally -- MENISCUS: negative McMurray's, negative Thessaly bilaterally -- PF JOINT: nml patellar mobility bilaterally, with crepitus.  Positive Clark sign bilaterally.  Bilateral hips: normal ROM, negative FABER and FADIR  bilaterally   ASSESSMENT/PLAN:   Chronic pain of both knees Known moderate tricompartmental osteoarthritis seen on imaging 06/2017 bilaterally.  Had previously discussed treatment regimens with the patient, she has gone to physical therapy and continues to do exercises at home that she was shown by them, no improvement.  She is using Tylenol and Voltaren gel as well.  She is also trying to stay active and doing bicycling.  Discussed also incorporating water aerobics to decrease joint loading while increasing exercise.  Ultimately, discussed treatment options with patient including corticosteroid injections, but advised that this would not treat the problem, only pain.  Advised that she will likely ultimately need knee replacements.  She was open to proceeding with corticosteroid injection, however, her new insurance states that she must be seen by a [facility] physician and she is worried of cost.  For now, we will hold off with [facility] and patient will try to get in with a to primary care office.  She does request referral for orthopedics, however, because she is trying to obtain disability and states that this is needed.  Advised to continue exercise regimen and to incorporate water aerobics.  Follow-up if she does not establish with a [facility] physician.     Cleophas Dunker, [place]

## 2019-10-07 NOTE — Assessment & Plan Note (Signed)
Known moderate tricompartmental osteoarthritis seen on imaging 06/2017 bilaterally.  Had previously discussed treatment regimens with the patient, she has gone to physical therapy and continues to do exercises at home that she was shown by them, no improvement.  She is using Tylenol and Voltaren gel as well.  She is also trying to stay active and doing bicycling.  Discussed also incorporating water aerobics to decrease joint loading while increasing exercise.  Ultimately, discussed treatment options with patient including corticosteroid injections, but advised that this would not treat the problem, only pain.  Advised that she will likely ultimately need knee replacements.  She was open to proceeding with corticosteroid injection, however, her new insurance states that she must be seen by a [facility] physician and she is worried of cost.  For now, we will hold off with CSI and patient will try to get in with a to primary care office.  She does request referral for orthopedics, however, because she is trying to obtain disability and states that this is needed.  Advised to continue exercise regimen and to incorporate water aerobics.  Follow-up if she does not establish with a [facility] physician.

## 2019-10-07 NOTE — Patient Instructions (Signed)
Thank you for coming to see me today. It was a pleasure. Today we talked about:   Continue exercising, water aerobics and biking are the best.  You can consider injections in the future.    I have placed a referral to Orthopedic Surgery for your knee pain and arthritis.  If you do not hear from them in the next 2 weeks, please give [place] a call.  If you don't establish with a new provider at [facility], please come back to see my in 3 months.  If you have any questions or concerns, please do not hesitate to call the office at [phone].  Best,   Arizona Constable, DO

## 2019-10-14 ENCOUNTER — Encounter: Payer: Self-pay | Admitting: Family Medicine

## 2019-11-02 ENCOUNTER — Ambulatory Visit: Payer: Self-pay | Admitting: Physician Assistant

## 2019-11-03 ENCOUNTER — Ambulatory Visit: Payer: Self-pay | Admitting: Family

## 2019-11-09 ENCOUNTER — Encounter: Payer: Self-pay | Admitting: Physician Assistant

## 2019-11-09 ENCOUNTER — Ambulatory Visit (INDEPENDENT_AMBULATORY_CARE_PROVIDER_SITE_OTHER): Payer: Self-pay | Admitting: Physician Assistant

## 2019-11-09 ENCOUNTER — Ambulatory Visit: Payer: Self-pay

## 2019-11-09 ENCOUNTER — Ambulatory Visit (INDEPENDENT_AMBULATORY_CARE_PROVIDER_SITE_OTHER): Payer: Self-pay

## 2019-11-09 VITALS — Ht 69.0 in | Wt 224.0 lb

## 2019-11-09 DIAGNOSIS — M25561 Pain in right knee: Secondary | ICD-10-CM

## 2019-11-09 DIAGNOSIS — M25562 Pain in left knee: Secondary | ICD-10-CM

## 2019-11-09 NOTE — Progress Notes (Signed)
Office Visit Note   Patient: Rachel Cortez           Date of Birth: April 18, 1965           MRN: [id number] Visit Date: 11/09/2019              Requested by: Zenia Resides, MD [address] [place],  [place] [address] PCP: Cleophas Dunker, DO  Chief Complaint  Patient presents with  . Right Knee - Pain  . Left Knee - Pain      HPI: This is a pleasant 54 year old woman with a several year history of right greater than left knee pain.  She is getting frustrated because she enjoys walking 5 miles and can no longer do this.  She also notices it when she is descending stairs or trying to climb stairs.  Most of her pain is over her anterior part of her knee.  She has tried Voltaren and anti-inflammatories without success  Assessment & Plan: Visit Diagnoses:  1. Acute pain of right knee     Plan: We had a long discussion about her knees.  I explained to her that I believe most of her painful symptoms are in the anterior patellofemoral joint of both of her knees.  I demonstrated to her some straight leg exercises and I asked that she work up to doing 100 leg raises on each side daily.  She could split this up into 3 or 4 sessions.  And this got easy for her she could add a light ankle weight.  We also talked about steroid injection and viscosupplementation.  She does not want to pursue either of these today.  She will commit to doing the exercises.  She will follow-up in 1 month with Dr. Sharol Given  Follow-Up Instructions: No follow-ups on file.   Ortho Exam  Patient is alert, oriented, no adenopathy, well-dressed, normal affect, normal respiratory effort. Bilateral knees no effusion no cellulitis.  No soft tissue swelling.  She does have tenderness around the patellofemoral joint.  Bilaterally she has crepitus with range of motion.  No medial or joint line pain particularly.  No instability.  Imaging: XR Knee 1-2 Views Left  Result Date: 11/09/2019 Is ever knee were taken today  overall well-maintained alignment.  Joint spaces are slightly narrowed though specifically she does have spurring at the superior pole of the patella and narrowing at the patellofemoral joint  XR Knee 1-2 Views Right  Result Date: 11/09/2019 2 views of her right knee were reviewed today.  Overall well-maintained alignment with just some mild narrowing in the joint.  She does have however narrowing in spurring of the patellofemoral joint.  No images are attached to the encounter.  Labs: Lab Results  Component Value Date   HGBA1C 5.6 07/06/2011     Lab Results  Component Value Date   ALBUMIN 3.5 (L) 10/29/2014   ALBUMIN 3.5 04/30/2014   ALBUMIN 3.8 01/28/2013    Lab Results  Component Value Date   MG 1.8 01/28/2013   Lab Results  Component Value Date   VD25OH 30 04/30/2014   VD25OH 39 11/25/2012    No results found for: PREALBUMIN CBC EXTENDED Latest Ref Rng & Units 03/29/2017 10/29/2014 04/30/2014  WBC 3.8 - 10.8 Thousand/uL 8.9 7.4 10.0  RBC 3.80 - 5.10 Million/uL 4.80 4.60 4.69  HGB 11.7 - 15.5 g/dL 12.8 11.8(L) 11.8(L)  HCT 35 - 45 % 38.6 36.4 36.6  PLT 140 - 400 Thousand/uL 282 282 304  NEUTROABS 1,500 - 7,800 cells/uL 5,705 5.0 6.8  LYMPHSABS 850 - 3,900 cells/uL 2,563 1.9 2.5     Body mass index is 33.08 kg/m.  Orders:  Orders Placed This Encounter  Procedures  . XR Knee 1-2 Views Left  . XR Knee 1-2 Views Right   No orders of the defined types were placed in this encounter.    Procedures: No procedures performed  Clinical Data: No additional findings.  ROS:  All other systems negative, except as noted in the HPI. Review of Systems  Objective: Vital Signs: Ht 5\' 9"  (1.753 m)   Wt 224 lb (101.6 kg)   LMP 08/08/2016   BMI 33.08 kg/m   Specialty Comments:  No specialty comments available.  PMFS History: Patient Active Problem List   Diagnosis Date Noted  . Chronic bilateral low back pain without sciatica 10/07/2018  . Chronic pain of  both knees 10/07/2018  . Bouchard's node 10/07/2018  . Mass of right lower leg 07/31/2013  . Piriformis syndrome 05/06/2012  . Heart murmur, systolic 16/10/9602  . HYPERCHOLESTEROLEMIA 03/07/2006  . Obesity 03/07/2006   Past Medical History:  Diagnosis Date  . Allergy    SEASONAL  . Anemia   . Heart murmur   . Migraine   . Obesity   . Osgood-Schlatter/osteochondroses     Family History  Problem Relation Age of Onset  . Diabetes Mother   . Hypertension Mother   . Hyperlipidemia Mother   . Hypertension Brother   . Hyperlipidemia Brother   . Diabetes Maternal Grandmother     Past Surgical History:  Procedure Laterality Date  . TUBAL LIGATION     Social History   Occupational History  . Not on file  Tobacco Use  . Smoking status: Never Smoker  . Smokeless tobacco: Never Used  Substance and Sexual Activity  . Alcohol use: No  . Drug use: No  . Sexual activity: Not on file

## 2019-11-10 ENCOUNTER — Other Ambulatory Visit: Payer: Self-pay | Admitting: Family Medicine

## 2019-11-10 DIAGNOSIS — M545 Low back pain, unspecified: Secondary | ICD-10-CM

## 2019-11-10 DIAGNOSIS — G8929 Other chronic pain: Secondary | ICD-10-CM

## 2019-11-10 DIAGNOSIS — M25562 Pain in left knee: Secondary | ICD-10-CM

## 2019-11-25 ENCOUNTER — Encounter: Payer: Self-pay | Admitting: Family Medicine

## 2019-12-07 ENCOUNTER — Ambulatory Visit: Payer: Self-pay | Admitting: Orthopedic Surgery

## 2019-12-07 ENCOUNTER — Ambulatory Visit: Payer: Self-pay

## 2019-12-18 ENCOUNTER — Encounter: Payer: Self-pay | Admitting: Family Medicine

## 2019-12-18 ENCOUNTER — Other Ambulatory Visit: Payer: Self-pay

## 2019-12-18 ENCOUNTER — Ambulatory Visit (INDEPENDENT_AMBULATORY_CARE_PROVIDER_SITE_OTHER): Payer: Self-pay | Admitting: Family Medicine

## 2019-12-18 VITALS — BP 140/62 | HR 75 | Ht 69.0 in | Wt 228.4 lb

## 2019-12-18 DIAGNOSIS — M545 Low back pain, unspecified: Secondary | ICD-10-CM

## 2019-12-18 NOTE — Progress Notes (Signed)
    SUBJECTIVE:   CHIEF COMPLAINT / HPI:   Low back pain [name] reports that she has been having low back pain for the past 2 days.  She remembers getting a heavy box of Christmas ornaments 2 days ago but she does not remember any specific pain with lifting.  It simply started the following day.  Her low back pain is generally dull pain with occasional twinges without significant radiation.  She specifically denies any shooting pain down either leg.  She also denies any pelvic numbness she denies fecal and urinary incontinence.  She presented to clinic today to ensure she had no trouble with her kidneys.  She specifically denies abdominal pain, polyuria, dysuria.  Her only concern about her kidneys was due to the location of the discomfort on her back and her friend telling her that this could be a kidney issue.  PERTINENT  PMH / PSH: Pain, known lumbar back pathology  OBJECTIVE:   BP 140/62   Pulse 75   Ht 5\' 9"  (1.753 m)   Wt 228 lb 6 oz (103.6 kg)   LMP 08/08/2016   SpO2 98%   BMI 33.73 kg/m   General: Alert and cooperative and appears to be in no acute distress Respiratory: Breathing comfortably on room air.  No respiratory distress. Back: No pain with percussion of the thoracic or lumbar vertebrae.  Tenderness to palpation of the thoracic paraspinal muscles.  Straight leg raise negative.  No tenderness with palpation of the greater trochanters bilaterally. Extremities: No peripheral edema. Warm/ well perfused. Neuro: Cranial nerves grossly intact  ASSESSMENT/PLAN:   Low back pain Most likely strain of paraspinal muscles.  I have a low suspicion for bony abnormality like compression fracture.  She does have a history of lumbar spine pathology.  I would prefer to do lumbar spine imaging but this is prohibitively expensive for her right now.  She was told that overall, I have a low suspicion for bony problems and this is very likely to be simply a muscular issue which will  improve with time.  For now, we can forego the x-ray and simply treat symptomatically.  She can continue her muscle relaxer and Tylenol at home.  This should improve on its own in the next 2-6 weeks.  She was encouraged to return to care if she had any new or concerning or worsening symptoms.     Matilde Haymaker, MD [place]

## 2019-12-18 NOTE — Assessment & Plan Note (Signed)
Most likely strain of paraspinal muscles.  I have a low suspicion for bony abnormality like compression fracture.  She does have a history of lumbar spine pathology.  I would prefer to do lumbar spine imaging but this is prohibitively expensive for her right now.  She was told that overall, I have a low suspicion for bony problems and this is very likely to be simply a muscular issue which will improve with time.  For now, we can forego the x-ray and simply treat symptomatically.  She can continue her muscle relaxer and Tylenol at home.  This should improve on its own in the next 2-6 weeks.  She was encouraged to return to care if she had any new or concerning or worsening symptoms.

## 2019-12-18 NOTE — Patient Instructions (Signed)
Low back pain: This is most likely from the strain of a small muscle in your back.  I have a low suspicion that this is related to a bony problem.  At this time, lets not move forward with an x-ray.  I expect your discomfort will improve although it may take as long as 4 to 6 weeks.  For now, continue to use muscle relaxers and Tylenol as needed for your discomfort.  Exercise is appropriate as long as it does not cause significant discomfort.  Please return to clinic if you are having worsening symptoms or this back pain last longer than 4-6 weeks.  At that time, we can consider lumbar spine x-rays.

## 2020-02-11 ENCOUNTER — Encounter: Payer: Self-pay | Admitting: Family Medicine

## 2020-02-24 ENCOUNTER — Encounter: Payer: Self-pay | Admitting: Family Medicine

## 2020-03-09 ENCOUNTER — Other Ambulatory Visit: Payer: Self-pay

## 2020-03-09 ENCOUNTER — Encounter: Payer: Self-pay | Admitting: Family Medicine

## 2020-03-09 ENCOUNTER — Ambulatory Visit (INDEPENDENT_AMBULATORY_CARE_PROVIDER_SITE_OTHER): Payer: 59 | Admitting: Family Medicine

## 2020-03-09 VITALS — BP 138/90 | HR 87 | Ht 69.0 in | Wt 228.0 lb

## 2020-03-09 DIAGNOSIS — Z117 Encounter for testing for latent tuberculosis infection: Secondary | ICD-10-CM

## 2020-03-09 DIAGNOSIS — G43809 Other migraine, not intractable, without status migrainosus: Secondary | ICD-10-CM

## 2020-03-09 DIAGNOSIS — Z0289 Encounter for other administrative examinations: Secondary | ICD-10-CM | POA: Diagnosis not present

## 2020-03-09 MED ORDER — RIZATRIPTAN BENZOATE 10 MG PO TABS
10.0000 mg | ORAL_TABLET | ORAL | 2 refills | Status: DC | PRN
Start: 2020-03-09 — End: 2021-03-06

## 2020-03-09 NOTE — Addendum Note (Signed)
Addended by: Cleophas Dunker on: 03/09/2020 03:41 PM   Modules accepted: Orders

## 2020-03-09 NOTE — Progress Notes (Addendum)
    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 Need to verify on the form that she has the physical and emotional ability to care for her children She reports no concerns with her mood She reports that she was able to walk around and care for her children without problem She also states she would be able to pull small children if needed TB form completed as well at employer's request She does not have any risk factors for exposure to tuberculosis and has no symptoms of tuberculosis  Migraines Patient has a history of migraines She uses Maxalt sparingly She has not had a refill in quite some time and needs a refill on this as she still occasionally gets migraines GERD relieved appropriately by Maxalt The headaches have not changed in nature   PERTINENT  PMH / PSH: HLD, Obesity  OBJECTIVE:   BP 138/90   Pulse 87   Ht 5\' 9"  (1.753 m)   Wt 228 lb (103.4 kg)   LMP 08/08/2016   SpO2 98%   BMI 33.67 kg/m    Physical Exam:  General: 55 y.o. female in NAD Lungs: Breathing comfortably on room Skin: warm and dry Extremities: No edema, ambulating without difficulty Psych: Mood and affect appropriate for circumstance, thought process linear logical, appropriate dress, good insight, no SI   ASSESSMENT/PLAN:   Encounter for completion of form with patient Forms completed patient request.  Congratulated her on her new job.  Will perform TB testing for patient's work.  Rest of form completed as well.  Migraine No change in character.  History of migraines.  Uses maxalt sparingly.  Refill provided.   Plan to have her come back in 2 weeks for a formal physical as she now has insurance Have been watching Bps, but reports at home are always 110s to 120s Could consider amb BP monitoring at next visit if remains high in clinic  Cleophas Dunker, [place]

## 2020-03-09 NOTE — Assessment & Plan Note (Addendum)
Forms completed patient request.  Congratulated her on her new job.  Will perform TB testing for patient's work.  Rest of form completed as well.

## 2020-03-09 NOTE — Addendum Note (Signed)
Addended by: Christen Bame D on: 03/09/2020 05:18 PM   Modules accepted: Orders

## 2020-03-09 NOTE — Assessment & Plan Note (Signed)
No change in character.  History of migraines.  Uses maxalt sparingly.  Refill provided.

## 2020-03-09 NOTE — Patient Instructions (Signed)
Thank you for coming to see me today. It was a pleasure. Today we talked about:   I'm glad that we were able to complete your forms for your new job!  Please follow-up with me as scheduled below.  If you have any questions or concerns, please do not hesitate to call the office at [phone].  Best,   Arizona Constable, DO

## 2020-03-11 ENCOUNTER — Ambulatory Visit (INDEPENDENT_AMBULATORY_CARE_PROVIDER_SITE_OTHER): Payer: 59

## 2020-03-11 ENCOUNTER — Other Ambulatory Visit: Payer: Self-pay

## 2020-03-11 DIAGNOSIS — Z111 Encounter for screening for respiratory tuberculosis: Secondary | ICD-10-CM

## 2020-03-11 LAB — TB SKIN TEST
Induration: 0 mm
TB Skin Test: NEGATIVE

## 2020-03-11 NOTE — Progress Notes (Signed)
Patient is here for a PPD read.  It was placed on 03/09/2020 in the left forearm @ 1445 .    PPD RESULTS:  Result: negative Induration: 0 mm  Letter created and given to patient for documentation purposes. Talbot Grumbling, RN

## 2020-03-24 ENCOUNTER — Other Ambulatory Visit: Payer: Self-pay

## 2020-03-24 ENCOUNTER — Encounter: Payer: Self-pay | Admitting: Family Medicine

## 2020-03-24 ENCOUNTER — Ambulatory Visit (INDEPENDENT_AMBULATORY_CARE_PROVIDER_SITE_OTHER): Payer: 59 | Admitting: Family Medicine

## 2020-03-24 VITALS — BP 162/88 | HR 83 | Ht 69.0 in | Wt 224.4 lb

## 2020-03-24 DIAGNOSIS — D649 Anemia, unspecified: Secondary | ICD-10-CM | POA: Insufficient documentation

## 2020-03-24 DIAGNOSIS — Z0000 Encounter for general adult medical examination without abnormal findings: Secondary | ICD-10-CM

## 2020-03-24 DIAGNOSIS — Z1211 Encounter for screening for malignant neoplasm of colon: Secondary | ICD-10-CM

## 2020-03-24 DIAGNOSIS — R030 Elevated blood-pressure reading, without diagnosis of hypertension: Secondary | ICD-10-CM

## 2020-03-24 DIAGNOSIS — Z1231 Encounter for screening mammogram for malignant neoplasm of breast: Secondary | ICD-10-CM

## 2020-03-24 DIAGNOSIS — Z1159 Encounter for screening for other viral diseases: Secondary | ICD-10-CM

## 2020-03-24 DIAGNOSIS — E669 Obesity, unspecified: Secondary | ICD-10-CM

## 2020-03-24 NOTE — Assessment & Plan Note (Signed)
History of normocytic anemia with hemoglobin in the 11's.  Will obtain a CBC today.

## 2020-03-24 NOTE — Assessment & Plan Note (Signed)
Congratulated on making healthy choices.  Labs per above.  Follow-up in 1 month.

## 2020-03-24 NOTE — Progress Notes (Signed)
Subjective:  CC -- Annual Physical; With no complaints.  Cardiovascular: - Dx Hypertension: no, SBPs at home 103-130, has been high here - Dx Hyperlipidemia: yes, last in 2019, no meds - Dx Obesity: yes, has been watching her diet and exercise, lost 4 lbs - Physical Activity: yes, walking now  - Diabetes: no  Cancer: Colorectal >> Colonoscopy: no, had negative cologuard in 2019, no fam hx colon cancer Lung >> Tobacco Use: no Breast >> Mammogram: yes, needs one, last in 2019  Cervical/Endometrial >>  - Postmenopausal: yes, Last in March 2020  - Vaginal Bleeding: no - Pap Smear: yes, had NILM pap in 03/29/2017 but HPV not checked, needs another   - Previous Abnormal Pap: yes, years ago, had colposcopy  Skin >> Suspicious lesions: no   Social: Alcohol Use: no  Tobacco Use: no  Other Drugs: no  Risky Sexual Behavior: no  Depression: no   - PHQ9 score:  [place] Office Visit from 03/24/2020 in [place]  PHQ-9 Total Score 0    Support and Life at Home: yes   Other: Osteoporosis: no Zoster Vaccine: no, educated on this Flu Vaccine: no  Pneumonia Vaccine: no, no indication COVID vaccination: has been vaccinated and booster in January  Past Medical History Patient Active Problem List   Diagnosis Date Noted  . Normocytic anemia 03/24/2020  . Chronic bilateral low back pain without sciatica 10/07/2018  . Chronic pain of both knees 10/07/2018  . Elevated BP without diagnosis of hypertension 08/26/2018  . Encounter for annual health examination 07/08/2011  . HYPERCHOLESTEROLEMIA 03/07/2006  . Obesity (BMI 30-39.9) 03/07/2006  . Migraine 03/07/2006    Medications- reviewed and updated Current Outpatient Medications  Medication Sig Dispense Refill  . cyclobenzaprine (FLEXERIL) 10 MG tablet Take 1 tablet (10 mg total) by mouth at bedtime as needed for muscle spasms. 30 tablet 1  . Fexofenadine HCl (ALLEGRA PO) Take by mouth.    . Multiple  Vitamin (MULTIVITAMIN) tablet Take 1 tablet by mouth daily. 30 tablet 0  . ondansetron (ZOFRAN ODT) 4 MG disintegrating tablet Take 1 tablet (4 mg total) by mouth every 8 (eight) hours as needed for nausea or vomiting. 20 tablet 2  . rizatriptan (MAXALT) 10 MG tablet Take 1 tablet (10 mg total) by mouth as needed for migraine. May repeat in 2 hours if needed 10 tablet 2   No current facility-administered medications for this visit.    Objective: BP (!) 162/88   Pulse 83   Ht 5\' 9"  (1.753 m)   Wt 224 lb 6.4 oz (101.8 kg)   LMP 08/08/2016   SpO2 95%   BMI 33.14 kg/m  Gen: NAD, alert, cooperative with exam HEENT: NCAT, EOMI, PERRL Neck: supple, no thyromegaly CV: RRR, good S1/S2, no murmur Resp: CTABL, no wheezes, non-labored Genital Exam: not done Ext: No edema, warm Neuro: Alert and oriented, No gross deficits   Assessment/Plan:  Encounter for annual health examination Given that patient now has insurance, she is coming in today for health maintenance.  She is making great changes in her overall health and working on diet and exercise.  She is very happy with her new job.  Health maintenance addressed, obtained hepatitis C antibody, lipid panel, BMP today.  Ordered mammogram.  Also ordered Cologuard, patient declines colonoscopy.  She is due for a Pap smear because she did not have HPV cotesting with her last Pap although it was normal.  She will come back to  have this performed.  Obesity (BMI 30-39.9) Congratulated on making healthy choices.  Labs per above.  Follow-up in 1 month.  Elevated BP without diagnosis of hypertension Blood pressure has been elevated here at the office, but she has blood pressures within normal limits at home.  She does check with a wrist cuff.  Given the difference between her home BPs and here in the office, will go ahead and send her for ambulatory blood pressure monitoring with Dr. Valentina Lucks.  She will follow-up in 1 month and we will address that she  needs to start an antihypertensive.  Normocytic anemia History of normocytic anemia with hemoglobin in the 11's.  Will obtain a CBC today.   Orders Placed This Encounter  Procedures  . MM Digital Screening    Standing Status:   Future    Standing Expiration Date:   03/24/2021    Order Specific Question:   Reason for Exam (SYMPTOM  OR DIAGNOSIS REQUIRED)    Answer:   screening    Order Specific Question:   Is the patient pregnant?    Answer:   No    Order Specific Question:   Preferred imaging location?    Answer:   [facility]  . HCV Ab w Reflex to Quant PCR  . Lipid Panel  . Basic Metabolic Panel  . CBC with Differential  . Cologuard    No orders of the defined types were placed in this encounter.    Ari[place] Constable, DO, PGY-3 03/24/2020 11:16 AM

## 2020-03-24 NOTE — Assessment & Plan Note (Signed)
Blood pressure has been elevated here at the office, but she has blood pressures within normal limits at home.  She does check with a wrist cuff.  Given the difference between her home BPs and here in the office, will go ahead and send her for ambulatory blood pressure monitoring with Dr. Valentina Lucks.  She will follow-up in 1 month and we will address that she needs to start an antihypertensive.

## 2020-03-24 NOTE — Patient Instructions (Signed)
Thank you for coming to see me today. It was a pleasure. Today we talked about:   We will get some labs today.  If they are abnormal or we need to do something about them, I will call you.  If they are normal, I will send you a message on MyChart (if it is active) or a letter in the mail.  If you don't hear from [place] in 2 weeks, please call the office at the number below.  I have placed an order for your mammogram.  Please call [facility] at [phone] to schedule your appointment within one week.  Do your cologuard.  Schedule an appointment with Dr. Valentina Lucks for ambulatory blood pressure monitoring.  Please follow-up with me ASAP for a pap smear, otherwise in 1 month.  If you have any questions or concerns, please do not hesitate to call the office at [phone].  Best,   Arizona Constable, DO

## 2020-03-24 NOTE — Assessment & Plan Note (Signed)
Given that patient now has insurance, she is coming in today for health maintenance.  She is making great changes in her overall health and working on diet and exercise.  She is very happy with her new job.  Health maintenance addressed, obtained hepatitis C antibody, lipid panel, BMP today.  Ordered mammogram.  Also ordered Cologuard, patient declines colonoscopy.  She is due for a Pap smear because she did not have HPV cotesting with her last Pap although it was normal.  She will come back to have this performed.

## 2020-03-25 LAB — CBC WITH DIFFERENTIAL/PLATELET
Basophils Absolute: 0.1 10*3/uL (ref 0.0–0.2)
Basos: 1 %
EOS (ABSOLUTE): 0.1 10*3/uL (ref 0.0–0.4)
Eos: 1 %
Hematocrit: 41.3 % (ref 34.0–46.6)
Hemoglobin: 13.3 g/dL (ref 11.1–15.9)
Immature Grans (Abs): 0 10*3/uL (ref 0.0–0.1)
Immature Granulocytes: 0 %
Lymphocytes Absolute: 2.2 10*3/uL (ref 0.7–3.1)
Lymphs: 34 %
MCH: 26.7 pg (ref 26.6–33.0)
MCHC: 32.2 g/dL (ref 31.5–35.7)
MCV: 83 fL (ref 79–97)
Monocytes Absolute: 0.4 10*3/uL (ref 0.1–0.9)
Monocytes: 6 %
Neutrophils Absolute: 3.8 10*3/uL (ref 1.4–7.0)
Neutrophils: 58 %
Platelets: 268 10*3/uL (ref 150–450)
RBC: 4.98 x10E6/uL (ref 3.77–5.28)
RDW: 13.1 % (ref 11.7–15.4)
WBC: 6.5 10*3/uL (ref 3.4–10.8)

## 2020-03-25 LAB — LIPID PANEL
Chol/HDL Ratio: 5.6 ratio — ABNORMAL HIGH (ref 0.0–4.4)
Cholesterol, Total: 246 mg/dL — ABNORMAL HIGH (ref 100–199)
HDL: 44 mg/dL (ref >39)
LDL Chol Calc (NIH): 178 mg/dL — ABNORMAL HIGH (ref 0–99)
Triglycerides: 133 mg/dL (ref 0–149)
VLDL Cholesterol Cal: 24 mg/dL (ref 5–40)

## 2020-03-25 LAB — BASIC METABOLIC PANEL
BUN/Creatinine Ratio: 10 (ref 9–23)
BUN: 9 mg/dL (ref 6–24)
CO2: 22 mmol/L (ref 20–29)
Calcium: 9.2 mg/dL (ref 8.7–10.2)
Chloride: 105 mmol/L (ref 96–106)
Creatinine, Ser: 0.9 mg/dL (ref 0.57–1.00)
Glucose: 99 mg/dL (ref 65–99)
Potassium: 4 mmol/L (ref 3.5–5.2)
Sodium: 141 mmol/L (ref 134–144)
eGFR: 76 mL/min/{1.73_m2} (ref >59)

## 2020-03-25 LAB — HCV AB W REFLEX TO QUANT PCR: HCV Ab: <0.1 s/co ratio (ref 0.0–0.9)

## 2020-03-25 LAB — HCV INTERPRETATION

## 2020-03-31 ENCOUNTER — Encounter: Payer: Self-pay | Admitting: Pharmacist

## 2020-03-31 ENCOUNTER — Ambulatory Visit (INDEPENDENT_AMBULATORY_CARE_PROVIDER_SITE_OTHER): Payer: 59 | Admitting: Pharmacist

## 2020-03-31 ENCOUNTER — Other Ambulatory Visit: Payer: Self-pay

## 2020-03-31 DIAGNOSIS — R030 Elevated blood-pressure reading, without diagnosis of hypertension: Secondary | ICD-10-CM | POA: Diagnosis not present

## 2020-03-31 NOTE — Assessment & Plan Note (Signed)
Patient with history of elevated BP found to have white coat hypertension. Patient only able to wear ambulatory blood pressure monitor for ~8 hours due to monitor causing multiple erythematous streaks from cuff squeezing too tight. Ambulatory blood pressure monitor demonstrates elevated blood pressure with an average blood pressure of 153/89 mmHg. Discussed results with patient and PCP, Dr. Sandi Carne, and agreed to focus on diet and lifestyle modifications for ~1 month. Can consider starting an antihypertensive agent (CCB or ARB) if additional BP management is needed.

## 2020-03-31 NOTE — Patient Instructions (Addendum)
Wearing the Blood Pressure Monitor  The cuff will inflate every 20 minutes during the day and every 30 minutes while you sleep.  Your blood pressure readings will NOT display after cuff inflation  Fill out the blood pressure-activity diary during the day, especially during activities that may affect your reading -- such as exercise, stress, walking, taking your blood pressure medications  Important things to know:  Avoid taking the monitor off for the next 24 hours, unless it causes you discomfort or pain.  Do NOT get the monitor wet and do NOT dry to clean the monitor with any cleaning products.  Do NOT put the monitor on anyone else's arm.  When the cuff inflates, avoid excess movement. Let the cuffed arm hang loosely, slightly away from the body. Avoid flexing the muscles or moving the hand/fingers.  When you go to sleep, make sure that the hose is not kinked.  Remember to fill out the blood pressure activity diary.  If you experience severe pain or unusual pain (not associated with getting your blood pressure checked), remove the monitor.  Troubleshooting:  Code  Troubleshooting   1  Check cuff position, tighten cuff   2, 3  Remain still during reading   4, 87  Check air hose connections and make sure cuff is tight   85, 89  Check hose connections and make tubing is not crimped   86  Push START/STOP to restart reading   88, 91  Retry by pushing START/STOP   90  Replace batteries. If problem persists, remove monitor and bring back to   clinic at follow up   97, 98, 99  Service required - Remove monitor and bring back to clinic at follow up   Blood Pressure Activity Diary Time Lying down/ Sleeping Walking/ Exercise Stressed/ Angry Headache/ Pain Dizzy  9 AM       10 AM       11 AM       12 PM       1 PM       2 PM       Time Lying down/ Sleeping Walking/ Exercise Stressed/ Angry Headache/ Pain Dizzy  3 PM       4 PM        5 PM       6 PM       7 PM       8 PM        Time Lying down/ Sleeping Walking/ Exercise Stressed/ Angry Headache/ Pain Dizzy  9 PM       10 PM       11 PM       12 AM       1 AM       2 AM       3 AM       Time Lying down/ Sleeping Walking/ Exercise Stressed/ Angry Headache/ Pain Dizzy  4 AM       5 AM       6 AM       7 AM       8 AM       9 AM       10 AM        Time you woke up: _________                  Time you went to sleep:__________    Come back tomorrow at 8:30 to have the  monitor removed  Call the [facility] if you have any questions before then ([phone])   Follow-up No change in meds based on short time of amb BP readings.  Follow-up in 1 month with Dr. Sandi Carne to consider options RE blood pressure.

## 2020-03-31 NOTE — Progress Notes (Signed)
S:     Patient arrives in good spirits. Presents to the clinic for ambulatory blood pressure evaluation.   Patient was referred and last seen by Primary Care Provider on 03/24/20.   Patient returned back to clinic after wearing the ambulatory BP monitor for ~8 hrs. Reports monitor caused multiple erythematous streaks on right upper arm under cuff, causing irritation and need for removal. Patient reports stressful day at work, rates 8/10.   Current BP Medications include:  none  Antihypertensives tried in the past include: none  O:   Physical Exam Vitals reviewed.  Neurological:     General: No focal deficit present.     Mental Status: She is alert.  Psychiatric:        Mood and Affect: Mood normal.        Behavior: Behavior normal.        Thought Content: Thought content normal.    Review of Systems  All other systems reviewed and are negative.  Last 3 Office BP readings: BP Readings from Last 3 Encounters:  03/24/20 (!) 162/88  03/09/20 138/90  12/18/19 140/62    Clinical Atherosclerotic Cardiovascular Disease (ASCVD): No  The 10-year ASCVD risk score Mikey Bussing DC Jr., et al., 2013) is: 4.5%   Values used to calculate the score:     Age: 55 years     Sex: Female     Is Non-Hispanic African American: No     Diabetic: No     Tobacco smoker: No     Systolic Blood Pressure: 614 mmHg     Is BP treated: No     HDL Cholesterol: 44 mg/dL     Total Cholesterol: 246 mg/dL  Basic Metabolic Panel    Component Value Date/Time   NA 141 03/24/2020 1028   K 4.0 03/24/2020 1028   CL 105 03/24/2020 1028   CO2 22 03/24/2020 1028   GLUCOSE 99 03/24/2020 1028   GLUCOSE 81 03/29/2017 1121   BUN 9 03/24/2020 1028   CREATININE 0.90 03/24/2020 1028   CREATININE 0.86 03/29/2017 1121   CALCIUM 9.2 03/24/2020 1028   GFRNONAA 82 (L) 12/07/2012 0743   GFRAA >90 12/07/2012 0743    Renal function: Estimated Creatinine Clearance: 90.7 mL/min (by C-G formula based on SCr of 0.9  mg/dL).  ABPM Study Data: Arm Placement right arm   Overall Mean ~8hr BP with > 12 readings:   153/89 mmHg HR: 78   Non-hypertensive ABPM thresholds: daytime BP <125/75 mmHg, sleeptime BP <120/70 mmHg   Discussed procedure for wearing the monitor and gave patient written instructions. Monitor was placed on non-dominant arm with instructions to return in the morning.   A/P: Patient with history of elevated BP found to have white coat hypertension. Patient only able to wear ambulatory blood pressure monitor for ~8 hours due to monitor causing multiple erythematous streaks from cuff squeezing too tight. Ambulatory blood pressure monitor demonstrates elevated blood pressure with an average blood pressure of 153/89 mmHg. Discussed results with patient and PCP, Dr. Sandi Carne, and agreed to focus on diet and lifestyle modifications for ~1 month. Can consider starting an antihypertensive agent (CCB or ARB) if additional BP management is needed.    Results reviewed and written information provided.  Total time in face-to-face counseling 25 minutes.   F/U Clinic Visit with Dr. Sandi Carne in ~1 month.  Patient seen with Inis Sizer, PharmD Candidate, and Shauna Hugh, PharmD - PGY-1 Resident, and Lorel Monaco, PharmD, BCPS - PGY2 Pharmacy Resident.

## 2020-03-31 NOTE — Progress Notes (Signed)
Reviewed: I agree with Dr. Koval's documentation and management. 

## 2020-04-15 LAB — COLOGUARD: Cologuard: NEGATIVE

## 2020-04-21 LAB — EXTERNAL GENERIC LAB PROCEDURE: COLOGUARD: NEGATIVE

## 2020-04-21 LAB — COLOGUARD: COLOGUARD: NEGATIVE

## 2020-04-25 ENCOUNTER — Ambulatory Visit: Payer: 59 | Admitting: Family Medicine

## 2020-05-26 NOTE — Progress Notes (Signed)
    SUBJECTIVE:   CHIEF COMPLAINT / HPI:   Elevated blood pressure follow-up Patient attempted ambulatory blood pressure monitoring in March, but was unable to tolerate the blood pressure cuff Her average blood pressure recording was 153/80 948 hours that she did wear it Had briefly seen the patient that day and she opted to continue diet and lifestyle modifications She has done very well with changing her diet, decreased salt intake, has lost 14 lbs on home scale, has been exercising  Does not want to do medication yet, very motivated  Cologuard negative in April She is scheduled for a mammogram 06/02/2020 She is due for a Pap smear  PERTINENT  PMH / PSH: HLD, Obesity, Elevated BP without diagnosis of HTN  OBJECTIVE:   BP 130/78   Pulse 80   Ht 5\' 9"  (1.753 m)   Wt 215 lb 6.4 oz (97.7 kg)   LMP 08/08/2016   SpO2 96%   BMI 31.81 kg/m    BP repeat 130/78  Physical Exam:  General: 55 y.o. female in NAD Cardio: RRR no m/r/g Lungs: CTAB, no wheezing, no rhonchi, no crackles, no IWOB on RA Skin: warm and dry Extremities: No edema   ASSESSMENT/PLAN:   Elevated BP without diagnosis of hypertension BP greatly improved on recheck.  Patient is very motivated to make lifestyle changes and is doing a great job - congratulated her on this.  No medications needed at this time.  She plans to continue her lifestyle modifications.  F/U 1 month.   She will come back at her convenience for a pap smear.  Cleophas Dunker, [place]

## 2020-05-27 ENCOUNTER — Other Ambulatory Visit: Payer: Self-pay

## 2020-05-27 ENCOUNTER — Ambulatory Visit: Payer: 59 | Admitting: Family Medicine

## 2020-05-27 ENCOUNTER — Encounter: Payer: Self-pay | Admitting: Family Medicine

## 2020-05-27 VITALS — BP 130/78 | HR 80 | Ht 69.0 in | Wt 215.4 lb

## 2020-05-27 DIAGNOSIS — R030 Elevated blood-pressure reading, without diagnosis of hypertension: Secondary | ICD-10-CM

## 2020-05-27 NOTE — Assessment & Plan Note (Signed)
BP greatly improved on recheck.  Patient is very motivated to make lifestyle changes and is doing a great job - congratulated her on this.  No medications needed at this time.  She plans to continue her lifestyle modifications.  F/U 1 month.

## 2020-05-27 NOTE — Patient Instructions (Signed)
Thank you for coming to see me today. It was a pleasure. Today we talked about:   [place] work on your lifestyle changes!  Your blood pressure was 130/78.  You are due for a Pap Smear.  Please schedule an appointment to come back at your earliest convenience to have this completed.  Please follow-up with me in 1 month for your blood pressure.  If you have any questions or concerns, please do not hesitate to call the office at [phone].  Best,   [place] Constable, DO

## 2020-06-02 ENCOUNTER — Other Ambulatory Visit: Payer: Self-pay

## 2020-06-02 ENCOUNTER — Ambulatory Visit
Admission: RE | Admit: 2020-06-02 | Discharge: 2020-06-02 | Disposition: A | Discharge status: Home / Self Care | Payer: 59 | Source: Ambulatory Visit | Attending: Family Medicine | Admitting: Family Medicine

## 2020-06-02 DIAGNOSIS — Z1231 Encounter for screening mammogram for malignant neoplasm of breast: Secondary | ICD-10-CM

## 2021-01-09 ENCOUNTER — Encounter: Payer: Self-pay | Admitting: Family Medicine

## 2021-03-06 ENCOUNTER — Ambulatory Visit (HOSPITAL_COMMUNITY)
Admission: EM | Admit: 2021-03-06 | Discharge: 2021-03-06 | Disposition: A | Discharge status: Home / Self Care | Payer: BC Managed Care – PPO | Attending: Internal Medicine | Admitting: Internal Medicine

## 2021-03-06 ENCOUNTER — Other Ambulatory Visit: Payer: Self-pay

## 2021-03-06 ENCOUNTER — Encounter (HOSPITAL_COMMUNITY): Payer: Self-pay

## 2021-03-06 DIAGNOSIS — G8929 Other chronic pain: Secondary | ICD-10-CM

## 2021-03-06 DIAGNOSIS — M545 Low back pain, unspecified: Secondary | ICD-10-CM

## 2021-03-06 MED ORDER — MELOXICAM 7.5 MG PO TABS: 7.5000 mg | ORAL_TABLET | Freq: Every day | ORAL | 0 refills | Status: DC

## 2021-03-06 MED ORDER — CYCLOBENZAPRINE HCL 10 MG PO TABS: 10.0000 mg | ORAL_TABLET | Freq: Every evening | ORAL | 0 refills | Status: DC | PRN

## 2021-03-06 NOTE — ED Provider Notes (Signed)
[facility]    CSN: [id number] Arrival date & time: 03/06/21  7062      History   Chief Complaint Chief Complaint  Patient presents with   Back Pain    HPI Rachel Cortez is a 56 y.o. female with a history of chronic back pain comes to urgent care with sharp and severe back pain which started a couple of days ago.  Patient denies any falls or trauma.  No nausea or vomiting.  Pain does not radiate into the lower extremities.  No weakness.  No bruising.  Patient's work is remarkable for recurrent bending.   HPI  Past Medical History:  Diagnosis Date   Allergy    SEASONAL   Anemia    Heart murmur    Migraine    Obesity    Osgood-Schlatter/osteochondroses     Patient Active Problem List   Diagnosis Date Noted   Normocytic anemia 03/24/2020   Chronic bilateral low back pain without sciatica 10/07/2018   Chronic pain of both knees 10/07/2018   Elevated BP without diagnosis of hypertension 08/26/2018   Encounter for annual health examination 07/08/2011   HYPERCHOLESTEROLEMIA 03/07/2006   Obesity (BMI 30-39.9) 03/07/2006   Migraine 03/07/2006    Past Surgical History:  Procedure Laterality Date   TUBAL LIGATION      OB History   No obstetric history on file.      Home Medications    Prior to Admission medications   Medication Sig Start Date End Date Taking? Authorizing Provider  meloxicam (MOBIC) 7.5 MG tablet Take 1 tablet (7.5 mg total) by mouth daily. 03/06/21  Yes Delaine Canter, Myrene Galas, MD  cyclobenzaprine (FLEXERIL) 10 MG tablet Take 1 tablet (10 mg total) by mouth at bedtime as needed for muscle spasms. 03/06/21   Madisan Bice, Myrene Galas, MD  Fexofenadine HCl (ALLEGRA PO) Take by mouth.    [provider]  Multiple Vitamin (MULTIVITAMIN) tablet Take 1 tablet by mouth daily. 12/19/16   Diallo, Earna Coder, MD  Olopatadine HCl (PATADAY OP) Apply to eye daily.    [provider]    Family History Family History  Problem Relation Age of Onset    Diabetes Mother    Hypertension Mother    Hyperlipidemia Mother    Hypertension Brother    Hyperlipidemia Brother    Diabetes Maternal Grandmother     Social History Social History   Tobacco Use   Smoking status: Never   Smokeless tobacco: Never  Substance Use Topics   Alcohol use: No   Drug use: No     Allergies   Codeine, Crab [shellfish allergy], and Penicillins   Review of Systems Review of Systems  Constitutional: Negative.   Gastrointestinal: Negative.   Musculoskeletal:  Positive for arthralgias and back pain. Negative for gait problem and joint swelling.  Neurological: Negative.     Physical Exam Triage Vital Signs ED Triage Vitals  Enc Vitals Group     BP 03/06/21 1046 (!) 164/90     Pulse Rate 03/06/21 1046 66     Resp 03/06/21 1046 16     Temp 03/06/21 1046 98.4 F (36.9 C)     Temp Source 03/06/21 1046 Oral     SpO2 03/06/21 1046 98 %     Weight --      Height --      Head Circumference --      Peak Flow --      Pain Score 03/06/21 1124 0  Pain Loc --      Pain Edu? --      Excl. in [place]? --    No data found.  Updated Vital Signs BP 138/90 (BP Location: Left Arm)    Pulse 66    Temp 98.4 F (36.9 C) (Oral)    Resp 16    LMP 08/08/2016    SpO2 98%   Visual Acuity Right Eye Distance:   Left Eye Distance:   Bilateral Distance:    Right Eye Near:   Left Eye Near:    Bilateral Near:     Physical Exam Vitals reviewed.  Constitutional:      General: She is in acute distress.     Appearance: Normal appearance. She is not ill-appearing.  Cardiovascular:     Rate and Rhythm: Normal rate and regular rhythm.     Pulses: Normal pulses.     Heart sounds: Normal heart sounds.  Musculoskeletal:        General: Tenderness present. Normal range of motion.     Comments: Tenderness over the lower back.  Straight leg raise test is negative.  Neurological:     Mental Status: She is alert.     UC Treatments / Results  Labs (all labs  ordered are listed, but only abnormal results are displayed) Labs Reviewed - No data to display  EKG   Radiology No results found.  Procedures Procedures (including critical care time)  Medications Ordered in UC Medications - No data to display  Initial Impression / Assessment and Plan / UC Course  I have reviewed the triage vital signs and the nursing notes.  Pertinent labs & imaging results that were available during my care of the patient were reviewed by me and considered in my medical decision making (see chart for details).     1.  Acute bilateral low back pain without sciatica: Flexeril at bedtime as needed for back spasms Meloxicam 7.5 mg nightly Gentle range of motion exercises Heating pad use Patient is advised to return to urgent care to be reevaluated if symptoms persist or worsens. Final Clinical Impressions(s) / UC Diagnoses   Final diagnoses:  Acute bilateral low back pain without sciatica     Discharge Instructions      Take medications as prescribed Gentle range of motion exercises Heating pad use on the 20 minutes on-20 minutes off regimen Return to urgent care if symptoms worsen.   ED Prescriptions     Medication Sig Dispense Auth. Provider   cyclobenzaprine (FLEXERIL) 10 MG tablet Take 1 tablet (10 mg total) by mouth at bedtime as needed for muscle spasms. 20 tablet Kaislyn Gulas, Myrene Galas, MD   meloxicam (MOBIC) 7.5 MG tablet Take 1 tablet (7.5 mg total) by mouth daily. 30 tablet Dosha Broshears, Myrene Galas, MD      PDMP not reviewed this encounter.   Chase Picket, MD 03/06/21 [phone]

## 2021-03-06 NOTE — ED Triage Notes (Signed)
Pt presents to the office for back pain. This is a recurrent issue for patient.

## 2021-03-06 NOTE — Discharge Instructions (Addendum)
Take medications as prescribed Gentle range of motion exercises Heating pad use on the 20 minutes on-20 minutes off regimen Return to urgent care if symptoms worsen.

## 2021-03-07 ENCOUNTER — Ambulatory Visit: Payer: Self-pay

## 2021-03-12 ENCOUNTER — Telehealth: Payer: BC Managed Care – PPO | Admitting: Family

## 2021-03-12 DIAGNOSIS — B349 Viral infection, unspecified: Secondary | ICD-10-CM | POA: Diagnosis not present

## 2021-03-12 NOTE — Patient Instructions (Signed)
Viral Illness, Adult ?Viruses are tiny germs that can get into a person's body and cause illness. There are many different types of viruses, and they cause many types of illness. Viral illnesses can range from mild to severe. They can affect various parts of the body. ?Short-term conditions that are caused by a virus include colds and the flu (influenza). Long-term conditions that are caused by a virus include herpes, shingles, and HIV (human immunodeficiency virus) infection. A few viruses have been linked to certain cancers. ?What are the causes? ?Many types of viruses can cause illness. Viruses invade cells in your body, multiply, and cause the infected cells to work abnormally or die. When these cells die, they release more of the virus. When this happens, you develop symptoms of the illness, and the virus continues to spread to other cells. If the virus takes over the function of the cell, it can cause the cell to divide and grow out of control. This happens when a virus causes cancer. ?Different viruses get into the body in different ways. You can get a virus by: ?Swallowing food or water that has come in contact with the virus (is contaminated). ?Breathing in droplets that have been coughed or sneezed into the air by an infected person. ?Touching a surface that has been contaminated with the virus and then touching your eyes, nose, or mouth. ?Being bitten by an insect or animal that carries the virus. ?Having sexual contact with a person who is infected with the virus. ?Being exposed to blood or fluids that contain the virus, either through an open cut or during a transfusion. ?If a virus enters your body, your body's defense system (immune system) will try to fight the virus. You may be at higher risk for a viral illness if your immune system is weak. ?What are the signs or symptoms? ?You may have these symptoms, depending on the type of virus and the location of the cells that it invades: ?Cold and flu  viruses: ?Fever. ?Headache. ?Sore throat. ?Muscle aches. ?Stuffy nose (nasal congestion). ?Cough. ?Digestive system (gastrointestinal) viruses: ?Fever. ?Pain in the abdomen. ?Nausea. ?Diarrhea. ?Liver viruses (hepatitis): ?Loss of appetite. ?Tiredness. ?Skin or the white parts of your eyes turning yellow (jaundice). ?Brain and spinal cord viruses: ?Fever. ?Headache. ?Stiff neck. ?Nausea and vomiting. ?Confusion or sleepiness. ?Skin viruses: ?Warts. ?Itching. ?Rash. ?Sexually transmitted viruses: ?Discharge. ?Swelling. ?Redness. ?Rash. ?How is this diagnosed? ?This condition may be diagnosed based on one or more of the following: ?Symptoms. ?Medical history. ?Physical exam. ?Blood test, sample of mucus from your lungs (sputum sample), stool sample, or a swab of body fluids or a skin sore (lesion). ?How is this treated? ?Viruses can be hard to treat because they live within cells. Antibiotic medicines do not treat viruses because these medicines do not get inside cells. Treatment for a viral illness may include: ?Resting and drinking plenty of fluids. ?Medicines to relieve symptoms. These can include over-the-counter medicine for pain and fever, medicines for cough or congestion, and medicines to relieve diarrhea. ?Antiviral medicines. These medicines are available only for certain types of viruses. ?Some viral illnesses can be prevented with vaccinations. A common example is the flu shot. ?Follow these instructions at home: ?Medicines ?Take over-the-counter and prescription medicines only as told by your health care provider. ?If you were prescribed an antiviral medicine, take it as told by your health care provider. Do not stop taking the antiviral even if you start to feel better. ?Be aware of when antibiotics are  needed and when they are not needed. Antibiotics do not treat viruses. You may get an antibiotic if your health care provider thinks that you may have, or are at risk for, a bacterial infection and you  have a viral infection. ?Do not ask for an antibiotic prescription if you have been diagnosed with a viral illness. Antibiotics will not make your illness go away faster. ?Frequently taking antibiotics when they are not needed can lead to antibiotic resistance. When this develops, the medicine no longer works against the bacteria that it normally fights. ?General instructions ? ?Drink enough fluids to keep your urine pale yellow. ?Rest as much as possible. ?Return to your normal activities as told by your health care provider. Ask your health care provider what activities are safe for you. ?Keep all follow-up visits as told by your health care provider. This is important. ?How is this prevented? ?To reduce your risk of viral illness: ?Wash your hands often with soap and water for at least 20 seconds. If soap and water are not available, use hand sanitizer. ?Avoid touching your nose, eyes, and mouth, especially if you have not washed your hands recently. ?If anyone in your household has a viral infection, clean all household surfaces that may have been in contact with the virus. Use soap and hot water. You may also use bleach that you have added water to (diluted). ?Stay away from people who are sick with symptoms of a viral infection. ?Do not share items such as toothbrushes and water bottles with other people. ?Keep your vaccinations up to date. This includes getting a yearly flu shot. ?Eat a healthy diet and get plenty of rest. ?Contact a health care provider if: ?You have symptoms of a viral illness that do not go away. ?Your symptoms come back after going away. ?Your symptoms get worse. ?Get help right away if you have: ?Trouble breathing. ?A severe headache or a stiff neck. ?Severe vomiting or pain in your abdomen. ?These symptoms may represent a serious problem that is an emergency. Do not wait to see if the symptoms will go away. Get medical help right away. Call your local emergency services (911 in the  [place] Do not drive yourself to the hospital. ?Summary ?Viruses are types of germs that can get into a person's body and cause illness. Viral illnesses can range from mild to severe. They can affect various parts of the body. ?Viruses can be hard to treat. There are medicines to relieve symptoms, and there are some antiviral medicines. ?If you were prescribed an antiviral medicine, take it as told by your health care provider. Do not stop taking the antiviral even if you start to feel better. ?Contact a health care provider if you have symptoms of a viral illness that do not go away. ?This information is not intended to replace advice given to you by your health care provider. Make sure you discuss any questions you have with your health care provider. ?Document Revised: 05/11/2019 Document Reviewed: 11/04/2018 ?Elsevier Patient Education ? [place]. ? ?

## 2021-03-12 NOTE — Progress Notes (Signed)
?Virtual Visit Consent  ? ?Rachel Cortez, you are scheduled for a virtual visit with a [facility] provider today.   ?  ?Just as with appointments in the office, your consent must be obtained to participate.  Your consent will be active for this visit and any virtual visit you may have with one of our providers in the next 365 days.   ?  ?If you have a MyChart account, a copy of this consent can be sent to you electronically.  All virtual visits are billed to your insurance company just like a traditional visit in the office.   ? ?As this is a virtual visit, video technology does not allow for your provider to perform a traditional examination.  This may limit your provider's ability to fully assess your condition.  If your provider identifies any concerns that need to be evaluated in person or the need to arrange testing (such as labs, EKG, etc.), we will make arrangements to do so.   ?  ?Although advances in technology are sophisticated, we cannot ensure that it will always work on either your end or our end.  If the connection with a video visit is poor, the visit may have to be switched to a telephone visit.  With either a video or telephone visit, we are not always able to ensure that we have a secure connection.    ? ?I need to obtain your verbal consent now.   Are you willing to proceed with your visit today?  ?  ?Rachel Cortez has provided verbal consent on 03/12/2021 for a virtual visit (video or telephone). ?  ?Evelina Dun, FNP  ? ?Date: 03/12/2021 1:22 PM ? ? ?Virtual Visit via Video Note  ? Rachel Cortez, connected with  Rachel Cortez  ([id number], [birth year]) on 03/12/21 at  1:15 PM EST by a video-enabled telemedicine application and verified that I am speaking with the correct person using two identifiers. ? ?Location: ?Patient: Virtual Visit Location Patient: Home ?Provider: Virtual Visit Location Provider: Home Office ?  ?I discussed the limitations of evaluation and management by telemedicine and the  availability of in person appointments. The patient expressed understanding and agreed to proceed.   ? ?History of Present Illness: ?Rachel Cortez is a 56 y.o. who identifies as a female who was assigned female at birth, and is being seen today for fever, diarrhea that started Wednesday, Thursday, and Friday. The diarrhea has improved.  She works in a daycare. However, starting Saturday she started feeling achy, fever, chills, and fatigue. She took a home COVID test that was negative.  ? ?HPI: Diarrhea  ?This is a new problem. The current episode started in the past 7 days. The problem occurs 5 to 10 times per day. The problem has been gradually improving. Associated symptoms include bloating, chills, coughing, a fever, headaches and myalgias. Pertinent negatives include no vomiting. She has tried increased fluids (tylenol) for the symptoms. The treatment provided mild relief.   ?Problems:  ?Patient Active Problem List  ? Diagnosis Date Noted  ? Normocytic anemia 03/24/2020  ? Chronic bilateral low back pain without sciatica 10/07/2018  ? Chronic pain of both knees 10/07/2018  ? Elevated BP without diagnosis of hypertension 08/26/2018  ? Encounter for annual health examination 07/08/2011  ? HYPERCHOLESTEROLEMIA 03/07/2006  ? Obesity (BMI 30-39.9) 03/07/2006  ? Migraine 03/07/2006  ?  ?Allergies:  ?Allergies  ?Allergen Reactions  ? Codeine Nausea And Vomiting and Rash  ?  Childhood  reaction  ? Crab [Shellfish Allergy] Rash  ? Penicillins Nausea And Vomiting and Rash  ?  Childhood reaction  ? ?Medications:  ?Current Outpatient Medications:  ?  cyclobenzaprine (FLEXERIL) 10 MG tablet, Take 1 tablet (10 mg total) by mouth at bedtime as needed for muscle spasms., Disp: 20 tablet, Rfl: 0 ?  Fexofenadine HCl (ALLEGRA PO), Take by mouth., Disp: , Rfl:  ?  meloxicam (MOBIC) 7.5 MG tablet, Take 1 tablet (7.5 mg total) by mouth daily., Disp: 30 tablet, Rfl: 0 ?  Multiple Vitamin (MULTIVITAMIN) tablet, Take 1 tablet by mouth  daily., Disp: 30 tablet, Rfl: 0 ?  Olopatadine HCl (PATADAY OP), Apply to eye daily., Disp: , Rfl:  ? ?Observations/Objective: ?Patient is well-developed, well-nourished in no acute distress.  ?Resting comfortably  at home.  ?Head is normocephalic, atraumatic.  ?No labored breathing.  ?Speech is clear and coherent with logical content.  ?Patient is alert and oriented at baseline.  ?Nasal congestion ? ?Assessment and Plan: ?1. Viral illness ? ?Given current symptoms, sounds like she has a viral/flu like illness that just started over the last two days. I do not think this is related to her GI illness from earlier in the week.  ?Force fluids ?Rest ?Tylenol  ?Muniex  ?Work note given ?Follow up if symptoms worsen or do not improve  ? ?Follow Up Instructions: ?I discussed the assessment and treatment plan with the patient. The patient was provided an opportunity to ask questions and all were answered. The patient agreed with the plan and demonstrated an understanding of the instructions.  A copy of instructions were sent to the patient via MyChart unless otherwise noted below.  ? ? ?The patient was advised to call back or seek an in-person evaluation if the symptoms worsen or if the condition fails to improve as anticipated. ? ?Time:  ?I spent 9 minutes with the patient via telehealth technology discussing the above problems/concerns.   ? ?Evelina Dun, FNP ? ?

## 2021-05-29 ENCOUNTER — Other Ambulatory Visit: Payer: Self-pay

## 2021-05-29 ENCOUNTER — Other Ambulatory Visit: Payer: Self-pay | Admitting: Family Medicine

## 2021-05-29 DIAGNOSIS — Z1231 Encounter for screening mammogram for malignant neoplasm of breast: Secondary | ICD-10-CM

## 2021-05-31 ENCOUNTER — Telehealth: Payer: Self-pay | Admitting: *Deleted

## 2021-05-31 NOTE — Telephone Encounter (Signed)
Patient left a message stating that she is due for her mammogram and needs an order placed by our office.  Will forward to MD. Johnney Ou

## 2021-05-31 NOTE — Telephone Encounter (Signed)
Patient already has order for mammogram. [facility].  MyChart message sent informing her of above and encouraging patient to schedule office appointment for annual physical as she is also due for Pap and blood pressure check.

## 2021-06-01 ENCOUNTER — Other Ambulatory Visit: Payer: Self-pay | Admitting: Family Medicine

## 2021-06-01 DIAGNOSIS — Z1231 Encounter for screening mammogram for malignant neoplasm of breast: Secondary | ICD-10-CM

## 2021-06-13 ENCOUNTER — Encounter: Payer: Self-pay | Admitting: *Deleted

## 2021-06-16 ENCOUNTER — Ambulatory Visit: Payer: BC Managed Care – PPO

## 2021-06-20 ENCOUNTER — Ambulatory Visit: Payer: BC Managed Care – PPO | Admitting: Family Medicine

## 2021-07-20 ENCOUNTER — Ambulatory Visit
Admission: RE | Admit: 2021-07-20 | Discharge: 2021-07-20 | Disposition: A | Discharge status: Home / Self Care | Payer: BC Managed Care – PPO | Source: Ambulatory Visit | Attending: Podiatry | Admitting: Podiatry

## 2021-07-20 ENCOUNTER — Ambulatory Visit: Payer: BC Managed Care – PPO | Admitting: Family Medicine

## 2021-07-20 DIAGNOSIS — Z1231 Encounter for screening mammogram for malignant neoplasm of breast: Secondary | ICD-10-CM | POA: Diagnosis not present

## 2021-08-11 ENCOUNTER — Encounter: Payer: Self-pay | Admitting: Family Medicine

## 2021-08-11 ENCOUNTER — Ambulatory Visit (INDEPENDENT_AMBULATORY_CARE_PROVIDER_SITE_OTHER): Payer: BC Managed Care – PPO | Admitting: Family Medicine

## 2021-08-11 ENCOUNTER — Other Ambulatory Visit (HOSPITAL_COMMUNITY)
Admission: RE | Admit: 2021-08-11 | Discharge: 2021-08-11 | Disposition: A | Discharge status: Home / Self Care | Payer: BC Managed Care – PPO | Source: Ambulatory Visit | Attending: Family Medicine | Admitting: Family Medicine

## 2021-08-11 VITALS — BP 138/88 | HR 81 | Ht 69.0 in | Wt 211.0 lb

## 2021-08-11 DIAGNOSIS — E7800 Pure hypercholesterolemia, unspecified: Secondary | ICD-10-CM

## 2021-08-11 DIAGNOSIS — Z0000 Encounter for general adult medical examination without abnormal findings: Secondary | ICD-10-CM

## 2021-08-11 DIAGNOSIS — Z131 Encounter for screening for diabetes mellitus: Secondary | ICD-10-CM | POA: Diagnosis not present

## 2021-08-11 DIAGNOSIS — R030 Elevated blood-pressure reading, without diagnosis of hypertension: Secondary | ICD-10-CM | POA: Diagnosis not present

## 2021-08-11 MED ORDER — ZOSTER VAC RECOMB ADJUVANTED 50 MCG/0.5ML IM SUSR: 0.5000 mL | Freq: Once | INTRAMUSCULAR | 0 refills | Status: AC

## 2021-08-11 NOTE — Progress Notes (Signed)
    SUBJECTIVE:   Chief complaint/HPI: annual examination  Rachel Cortez is a 56 y.o. who presents today for an annual exam.   History tabs reviewed and updated.   Elevated Blood Pressure Has been a problem at office visits in the past, but no formal diagnosis of hypertension. Checked BP at home and it was always 120/70s so she hasn't checked recently. No headaches or other symptoms so she thought her BP was fine. Strongly prefers to avoid medication when possible.  Review of systems form reviewed- no notable findings.   OBJECTIVE:   BP 138/88   Pulse 81   Ht _0  (1.753 m)   Wt 211 lb (95.7 kg)   LMP 08/08/2016   SpO2 100%   BMI 31.16 kg/m   Gen: NAD, pleasant, able to participate in exam CV: RRR, normal S1/S2, no murmur appreciated Resp: Normal effort, lungs CTAB Extremities: no edema or cyanosis Skin: warm and dry, no rashes noted Neuro: alert, no obvious focal deficits Psych: Normal affect and mood GU/GYN: Exam performed in the presence of a chaperone. External genitalia within normal limits.  Vaginal mucosa pink, moist, normal rugae.  Nonfriable cervix without lesions, no discharge or bleeding noted on speculum exam.  Bimanual exam revealed normal, nongravid uterus.  No cervical motion tenderness. No adnexal masses bilaterally.     ASSESSMENT/PLAN:   Elevated BP without diagnosis of hypertension BP elevated to 161/87 initially. Repeat improved at 138/88. She has had elevated BP at prior office visits as well. Likely warrants a diagnosis of hypertension at this point. Patient to monitor BP at home and bring written log to next appt. Counseled on possible need for medication and she was understanding.    Annual Examination  See AVS for age appropriate recommendations  PHQ score 0, reviewed and discussed.  BP reviewed- see above.  Asked about intimate partner violence and resources given as appropriate  Advance directives- not discussed today. Recommend addressing at  future visit.  Considered the following items based upon USPSTF recommendations: Diabetes screening: ordered Screening for elevated cholesterol: ordered HIV testing: previously normal. Repeat not indicated Hepatitis C:  previously normal. Repeat not indicated Syphilis if at high risk:  not at high risk, not ordered GC/CT not at high risk and not ordered. Osteoporosis screening considered based upon risk of fracture from [facility] calculator. Major osteoporotic fracture risk is 4.9%. DEXA not ordered.    Reviewed family history, BRCA testing not indicated.  Cervical cancer screening: due for Pap today, cytology + HPV ordered Breast cancer screening:  UTD on mammogram Colorectal cancer screening: up to date on screening for CRC.- had negative cologuard in 2022. Lung cancer screening:  not indicated .  Vaccinations: given printed Rx to get Shingrix at her pharmacy; otherwise UTD on vaccines.  Follow up in 1 month for BP visit.   Alcus Dad, MD [place]

## 2021-08-11 NOTE — Patient Instructions (Addendum)
It was great to meet you!  Things we discussed: -Today we did your Pap smear which is screening for cervical cancer.  IF it is completely normal, you will be due for repeat in 5 years. -We are checking some labs (kidney function, electrolytes, cholesterol, and screening for diabetes). I will send you a MyChart message with the results.  -Your blood pressure was high today.  This has been a recurring theme for you.  Many people have high blood pressure despite living a healthy lifestyle.  Untreated high blood pressure is a risk factor for heart disease, stroke, kidney damage, and other medical problems. -Please check your blood pressure once daily at home (at various times). Keep a written log of the numbers and bring it to your next appointment. We will likely need to start medication.  -With regard to healthy eating and diabetes prevention, aim for low glycemic index foods (you can google a list). As a general rule, increase WHOLE foods (vegetables, fruits, lean meats, nuts) and reduce processed foods/sugar-sweetened beverages.  Take care and seek immediate care sooner if you develop any concerns.  Dr. [facility]

## 2021-08-12 ENCOUNTER — Encounter: Payer: Self-pay | Admitting: Family Medicine

## 2021-08-12 LAB — BASIC METABOLIC PANEL
BUN/Creatinine Ratio: 11 (ref 9–23)
BUN: 9 mg/dL (ref 6–24)
CO2: 23 mmol/L (ref 20–29)
Calcium: 9.7 mg/dL (ref 8.7–10.2)
Chloride: 103 mmol/L (ref 96–106)
Creatinine, Ser: 0.8 mg/dL (ref 0.57–1.00)
Glucose: 99 mg/dL (ref 70–99)
Potassium: 4.2 mmol/L (ref 3.5–5.2)
Sodium: 141 mmol/L (ref 134–144)
eGFR: 87 mL/min/{1.73_m2} (ref >59)

## 2021-08-12 LAB — LIPID PANEL
Chol/HDL Ratio: 5.3 ratio — ABNORMAL HIGH (ref 0.0–4.4)
Cholesterol, Total: 273 mg/dL — ABNORMAL HIGH (ref 100–199)
HDL: 52 mg/dL (ref >39)
LDL Chol Calc (NIH): 193 mg/dL — ABNORMAL HIGH (ref 0–99)
Triglycerides: 151 mg/dL — ABNORMAL HIGH (ref 0–149)
VLDL Cholesterol Cal: 28 mg/dL (ref 5–40)

## 2021-08-12 LAB — HEMOGLOBIN A1C
Est. average glucose Bld gHb Est-mCnc: 114 mg/dL
Hgb A1c MFr Bld: 5.6 % (ref 4.8–5.6)

## 2021-08-12 NOTE — Assessment & Plan Note (Addendum)
BP elevated to 161/87 initially. Repeat improved at 138/88. She has had elevated BP at prior office visits as well. Likely warrants a diagnosis of hypertension at this point. Patient to monitor BP at home and bring written log to next appt. Counseled on possible need for medication and she was understanding. Check BMP today.

## 2021-08-15 LAB — CYTOLOGY - PAP
Comment: NEGATIVE
High risk HPV: NEGATIVE

## 2021-08-15 MED ORDER — ROSUVASTATIN CALCIUM 20 MG PO TABS: 20.0000 mg | ORAL_TABLET | Freq: Every day | ORAL | 3 refills | Status: DC

## 2021-08-15 NOTE — Addendum Note (Signed)
Addended by: Alcus Dad on: 08/15/2021 08:09 AM   Modules accepted: Orders

## 2021-08-16 ENCOUNTER — Telehealth: Payer: Self-pay | Admitting: Family Medicine

## 2021-08-16 ENCOUNTER — Encounter: Payer: Self-pay | Admitting: Family Medicine

## 2021-08-16 NOTE — Telephone Encounter (Signed)
Attempted to call patient to discuss pap results. No answer. Left voicemail that I would send MyChart message with details and try to call again tomorrow to answer any questions.

## 2021-08-17 MED ORDER — FLUCONAZOLE 150 MG PO TABS: 150.0000 mg | ORAL_TABLET | Freq: Once | ORAL | 0 refills | Status: AC

## 2021-08-17 NOTE — Telephone Encounter (Signed)
Called patient to discuss results. Rx sent for fluconazole to treat yeast. Discussed LSIL and need for repeat pap in 1 year.  Also discussed cholesterol-- statin recommended due to LDL >190. Patient prefers to work on diet and exercise and recheck in 3 months. I again stated my recommendation for statin and patient will think about it.

## 2021-09-01 ENCOUNTER — Ambulatory Visit (INDEPENDENT_AMBULATORY_CARE_PROVIDER_SITE_OTHER): Payer: BC Managed Care – PPO | Admitting: Student

## 2021-09-01 ENCOUNTER — Encounter: Payer: Self-pay | Admitting: Student

## 2021-09-01 ENCOUNTER — Telehealth: Payer: Self-pay

## 2021-09-01 VITALS — BP 157/68 | HR 74 | Ht 69.0 in | Wt 206.8 lb

## 2021-09-01 DIAGNOSIS — R030 Elevated blood-pressure reading, without diagnosis of hypertension: Secondary | ICD-10-CM | POA: Diagnosis not present

## 2021-09-01 DIAGNOSIS — G43119 Migraine with aura, intractable, without status migrainosus: Secondary | ICD-10-CM

## 2021-09-01 MED ORDER — RIZATRIPTAN BENZOATE 10 MG PO TABS: 10.0000 mg | ORAL_TABLET | ORAL | 0 refills | Status: DC | PRN

## 2021-09-01 NOTE — Progress Notes (Unsigned)
    SUBJECTIVE:   CHIEF COMPLAINT / HPI:   Migraine Hx of migraines. Previously used [employer]. Had one yesterday   Aura with flashing light Nose area had hurt  Last one was 6-7 months ago  HEADACHE  Headache started ***  Pain is *** Keeps from doing:  *** Medications tried: excedrin migraine and tlyenol Patient thinks cause of headache might be: ***  Head trauma: *** Sudden onset: *** Previous similar headaches: *** Taking blood thinners: *** History of cancer: ***  Symptoms Nose congestion stuffiness:  *** Nausea vomiting: nauseous  Photophobia: *** Noise sensitivity: *** Double vision or loss of vision: *** Fever: *** Neck Stiffness: *** Trouble walking or speaking: ***   PERTINENT  PMH / PSH: ***  OBJECTIVE:   BP (!) 148/92   Pulse 74   Ht '5\' 9"'$  (1.753 m)   Wt 206 lb 12.8 oz (93.8 kg)   LMP 08/08/2016   SpO2 100%   BMI 30.54 kg/m    Neuro: CN II: PERRL CN III, IV,VI: EOMI CV V: Normal sensation in V1, V2, V3 CVII: Symmetric smile and brow raise CN VIII: Normal hearing CN IX,X: Symmetric palate raise  CN XI: 5/5 shoulder shrug CN XII: Symmetric tongue protrusion  UE and LE strength 5/5 2+ UE and LE reflexes  Normal sensation in UE and LE bilaterally  No ataxia with finger to nose, normal heel to shin  Negative Rhomberg    ASSESSMENT/PLAN:   No problem-specific Assessment & Plan notes found for this encounter.     Gerrit Heck, MD [place]

## 2021-09-01 NOTE — Patient Instructions (Addendum)
It was great to see you! Thank you for allowing me to participate in your care!   Our plans for today:  - I have refilled your maxalt - Please schedule appointment for BP follow up - check BP at home, bring cuff in with you next visit  Take care and seek immediate care sooner if you develop any concerns.  Gerrit Heck, MD

## 2021-09-01 NOTE — Telephone Encounter (Signed)
Patient returns call to nurse line to see if any appointments have become available. Same day appointment with Dr. Jinny Sanders available.   Scheduled for 1125 with Dr. Jinny Sanders.   Talbot Grumbling, RN

## 2021-09-01 NOTE — Telephone Encounter (Signed)
Patient calls nurse line regarding refill on Maxalt. Patient reports that she has had migraine since yesterday and is having to miss work today.   Maxalt was discontinued from patient's medication list. Patient is requesting refill.   Scheduled patient appointment on Monday to discuss migraine management and receive work note.   Please advise if refill can be sent in prior to this appointment.   Talbot Grumbling, RN

## 2021-09-02 ENCOUNTER — Other Ambulatory Visit: Payer: Self-pay | Admitting: Student

## 2021-09-02 ENCOUNTER — Encounter: Payer: Self-pay | Admitting: Student

## 2021-09-02 DIAGNOSIS — G43119 Migraine with aura, intractable, without status migrainosus: Secondary | ICD-10-CM

## 2021-09-02 DIAGNOSIS — R030 Elevated blood-pressure reading, without diagnosis of hypertension: Secondary | ICD-10-CM

## 2021-09-02 MED ORDER — BLOOD PRESSURE KIT: 1.0000 | PACK | Freq: Two times a day (BID) | 0 refills | Status: DC

## 2021-09-02 MED ORDER — PROCHLORPERAZINE MALEATE 5 MG PO TABS: 5.0000 mg | ORAL_TABLET | Freq: Four times a day (QID) | ORAL | 0 refills | Status: DC | PRN

## 2021-09-04 ENCOUNTER — Ambulatory Visit: Payer: BC Managed Care – PPO

## 2021-09-04 NOTE — Assessment & Plan Note (Addendum)
Likely meets diagnosis of HTN. Shared decision making>patient would like to avoid medications. We discussed bringing her BP cuff in to compare reading from home vs here in office. Follow up in 9/15. We discussed beta blocker could help with migraine prophylaxis as well as mild BP control although not the best BP agent. -instructed to take AM and PM BP readings at home -follow up for HTN -consider starting agent at next visit

## 2021-09-04 NOTE — Assessment & Plan Note (Signed)
-  refilled maxalt 10 mg  -compazine prn

## 2021-09-18 NOTE — Progress Notes (Deleted)
    SUBJECTIVE:   CHIEF COMPLAINT / HPI:   Blood pressure follow- up -   PERTINENT  PMH / PSH: migraines, obesity  OBJECTIVE:   LMP 08/08/2016   ***  ASSESSMENT/PLAN:   No problem-specific Assessment & Plan notes found for this encounter.     Lenoria Chime, MD [place]

## 2021-09-22 ENCOUNTER — Ambulatory Visit: Payer: BC Managed Care – PPO | Admitting: Family Medicine

## 2021-10-04 ENCOUNTER — Telehealth: Payer: Self-pay | Admitting: Family Medicine

## 2021-10-04 NOTE — Telephone Encounter (Signed)
Patient calls nurse line returning Dr. Annett Gula call.   You can reach her at [phone].

## 2021-10-04 NOTE — Telephone Encounter (Signed)
HIPAA compliant callback message left.

## 2021-10-04 NOTE — Telephone Encounter (Signed)
I returned her call again with no response. Message left.

## 2021-11-06 ENCOUNTER — Ambulatory Visit (INDEPENDENT_AMBULATORY_CARE_PROVIDER_SITE_OTHER): Payer: BC Managed Care – PPO | Admitting: Student

## 2021-11-06 ENCOUNTER — Encounter: Payer: Self-pay | Admitting: Student

## 2021-11-06 DIAGNOSIS — R030 Elevated blood-pressure reading, without diagnosis of hypertension: Secondary | ICD-10-CM | POA: Diagnosis not present

## 2021-11-06 DIAGNOSIS — Z23 Encounter for immunization: Secondary | ICD-10-CM

## 2021-11-06 DIAGNOSIS — Z0000 Encounter for general adult medical examination without abnormal findings: Secondary | ICD-10-CM

## 2021-11-06 NOTE — Addendum Note (Signed)
Addended by: Leonia Corona R on: 11/06/2021 04:29 PM   Modules accepted: Orders

## 2021-11-06 NOTE — Patient Instructions (Addendum)
It was great to see you today! Thank you for choosing [facility] for your primary care.   Today we addressed: Vaccines: you are due to tetanus, flu, and shingles. You received your tetanus shot here, we do not carry the shingles vaccine, you can go to any pharmacy and request it without a prescription.  You are up to date for all of the routine cancer screenings.  Blood pressure:   You should return to our clinic Return in about 3 months (around 02/06/2022) for BP check .  Please arrive 15 minutes before your appointment to ensure smooth check in process.  We appreciate your efforts in making this happen.  Please call the clinic at [phone] if your symptoms worsen or you have any concerns.  Thank you for allowing me to participate in your care, Dr. Sabra Heck

## 2021-11-06 NOTE — Progress Notes (Signed)
    SUBJECTIVE:   CHIEF COMPLAINT / HPI:   Rachel Cortez is a 56 y.o. female  presenting for a routine physical and TB test for her job. On her last visit in August she met criteria for HTN, she was wishing to avoid medications and wanted to keep a BP log at home with good pressures <135/80.   Health Maintenance:  Tdap: due 11/2021  Flu shot: due  Shingrix: due  Mammogram: UTD Pap: UTD Colon screening: cologuard UTD   PERTINENT  PMH / PSH: Migraine, HTN, HLD  OBJECTIVE:   BP 138/80   Pulse 78   Temp 98.8 F (37.1 C)   Ht _0  (1.753 m)   Wt 209 lb 6.4 oz (95 kg)   LMP 08/08/2016   SpO2 99%   BMI 30.92 kg/m   Well-appearing, no acute distress Cardio: Regular rate, regular rhythm, no murmurs on exam. Pulm: Clear, no wheezing, no crackles. No increased work of breathing Abdominal: bowel sounds present, soft, non-tender, non-distended Extremities: no peripheral edema    ASSESSMENT/PLAN:   Healthcare maintenance Due for Tdap, Flu, and shingrix vaccines.  TB testing completed today  - received tdap today  - health forms filled out for work  - PPD ordered for work   Elevated BP without diagnosis of hypertension BP much better controlled today. Patient does not want to start medication, she is continuing to lose weight and make healthy lifestyle choices.  - continue monitoring BP, no need to medication at this time     Darci Current, [place]

## 2021-11-06 NOTE — Assessment & Plan Note (Signed)
BP much better controlled today. Patient does not want to start medication, she is continuing to lose weight and make healthy lifestyle choices.  - continue monitoring BP, no need to medication at this time

## 2021-11-06 NOTE — Assessment & Plan Note (Addendum)
Due for Tdap, Flu, and shingrix vaccines.  TB testing completed today  - received tdap today  - health forms filled out for work  - PPD ordered for work

## 2021-11-09 ENCOUNTER — Ambulatory Visit (INDEPENDENT_AMBULATORY_CARE_PROVIDER_SITE_OTHER): Payer: BC Managed Care – PPO

## 2021-11-09 DIAGNOSIS — Z111 Encounter for screening for respiratory tuberculosis: Secondary | ICD-10-CM

## 2021-11-09 LAB — TB SKIN TEST
Induration: 0 mm
TB Skin Test: NEGATIVE

## 2021-11-09 NOTE — Progress Notes (Signed)
Patient is here for a PPD read.  It was placed on 11/06/2021 in the left forearm @ 3:30 pm.    PPD RESULTS:  Result: negative Induration: 0 mm  Letter created and given to patient for documentation purposes. Talbot Grumbling, RN

## 2022-02-19 ENCOUNTER — Telehealth: Payer: No Typology Code available for payment source | Admitting: Nurse Practitioner

## 2022-02-19 DIAGNOSIS — R051 Acute cough: Secondary | ICD-10-CM

## 2022-02-19 DIAGNOSIS — J0110 Acute frontal sinusitis, unspecified: Secondary | ICD-10-CM

## 2022-02-19 DIAGNOSIS — J40 Bronchitis, not specified as acute or chronic: Secondary | ICD-10-CM | POA: Diagnosis not present

## 2022-02-19 MED ORDER — DOXYCYCLINE HYCLATE 100 MG PO TABS: 100.0000 mg | ORAL_TABLET | Freq: Two times a day (BID) | ORAL | 0 refills | Status: AC

## 2022-02-19 MED ORDER — BENZONATATE 100 MG PO CAPS: 100.0000 mg | ORAL_CAPSULE | Freq: Three times a day (TID) | ORAL | 0 refills | Status: DC | PRN

## 2022-02-19 MED ORDER — FLUTICASONE PROPIONATE 50 MCG/ACT NA SUSP: 2.0000 | Freq: Every day | NASAL | 6 refills | Status: DC

## 2022-02-19 NOTE — Progress Notes (Signed)
We are sorry that you are not feeling well.  Here is how we plan to help!  Based on your presentation I believe you most likely have A cough due to bacteria.  When patients have a fever and a productive cough with a change in color or increased sputum production, we are concerned about bacterial bronchitis.  If left untreated it can progress to pneumonia.  If your symptoms do not improve with your treatment plan it is important that you contact your provider.   I have prescribed Doxycycline 100 mg twice a day for 10 days, this antibiotic will also cover a sinus infection.     We will also call in a nasal spray for your sinuses  Meds ordered this encounter  Medications   doxycycline (VIBRA-TABS) 100 MG tablet    Sig: Take 1 tablet (100 mg total) by mouth 2 (two) times daily for 10 days.    Dispense:  20 tablet    Refill:  0   benzonatate (TESSALON) 100 MG capsule    Sig: Take 1 capsule (100 mg total) by mouth 3 (three) times daily as needed.    Dispense:  30 capsule    Refill:  0   fluticasone (FLONASE) 50 MCG/ACT nasal spray    Sig: Place 2 sprays into both nostrils daily.    Dispense:  16 g    Refill:  6      In addition you may use A prescription cough medication called Tessalon Perles 156m. You may take 1-2 capsules every 8 hours as needed for your cough.   From your responses in the eVisit questionnaire you describe inflammation in the upper respiratory tract which is causing a significant cough.  This is commonly called Bronchitis and has four common causes:   Allergies Viral Infections Acid Reflux Bacterial Infection Allergies, viruses and acid reflux are treated by controlling symptoms or eliminating the cause. An example might be a cough caused by taking certain blood pressure medications. You stop the cough by changing the medication. Another example might be a cough caused by acid reflux. Controlling the reflux helps control the cough.      HOME CARE Only take  medications as instructed by your medical team. Complete the entire course of an antibiotic. Drink plenty of fluids and get plenty of rest. Avoid close contacts especially the very young and the elderly Cover your mouth if you cough or cough into your sleeve. Always remember to wash your hands A steam or ultrasonic humidifier can help congestion.   GET HELP RIGHT AWAY IF: You develop worsening fever. You become short of breath You cough up blood. Your symptoms persist after you have completed your treatment plan MAKE SURE YOU  Understand these instructions. Will watch your condition. Will get help right away if you are not doing well or get worse.    Thank you for choosing an e-visit.  Your e-visit answers were reviewed by a board certified advanced clinical practitioner to complete your personal care plan. Depending upon the condition, your plan could have included both over the counter or prescription medications.  Please review your pharmacy choice. Make sure the pharmacy is open so you can pick up prescription now. If there is a problem, you may contact your provider through [employer] have the prescription routed to another pharmacy.  Your safety is important to [place] If you have drug allergies check your prescription carefully.   For the next 24 hours you can use MyChart to  ask questions about today's visit, request a non-urgent call back, or ask for a work or school excuse. You will get an email in the next two days asking about your experience. I hope that your e-visit has been valuable and will speed your recovery.   I spent approximately 5 minutes reviewing the patient's history, current symptoms and coordinating their care today.

## 2022-03-28 IMAGING — MG DIGITAL SCREENING BILAT W/ CAD
5 series · 5 of 5 positions shown · non-contrast
Comparison: Previous exam(s).

CLINICAL DATA: Screening.

EXAM:
DIGITAL SCREENING BILATERAL MAMMOGRAM WITH CAD
TECHNIQUE: Bilateral screening digital craniocaudal and mediolateral oblique
mammograms were obtained. The images were evaluated with
computer-aided detection.

[L CC (1 of 2)]
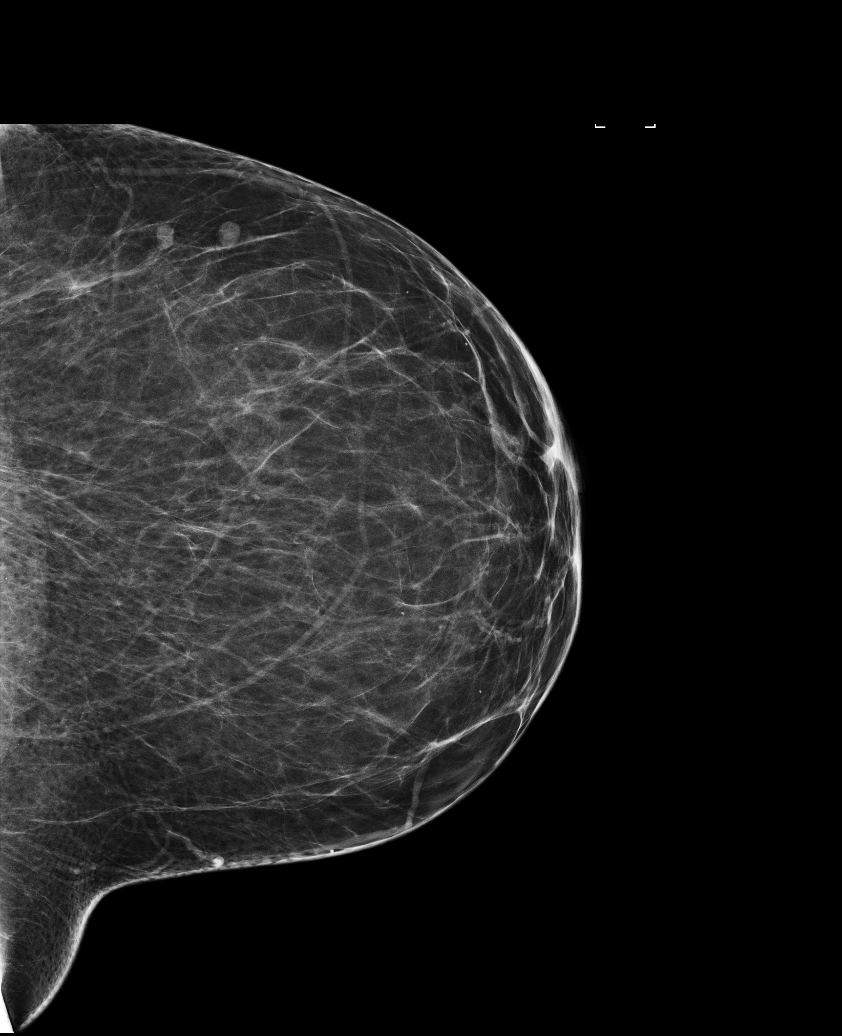

[R MLO]
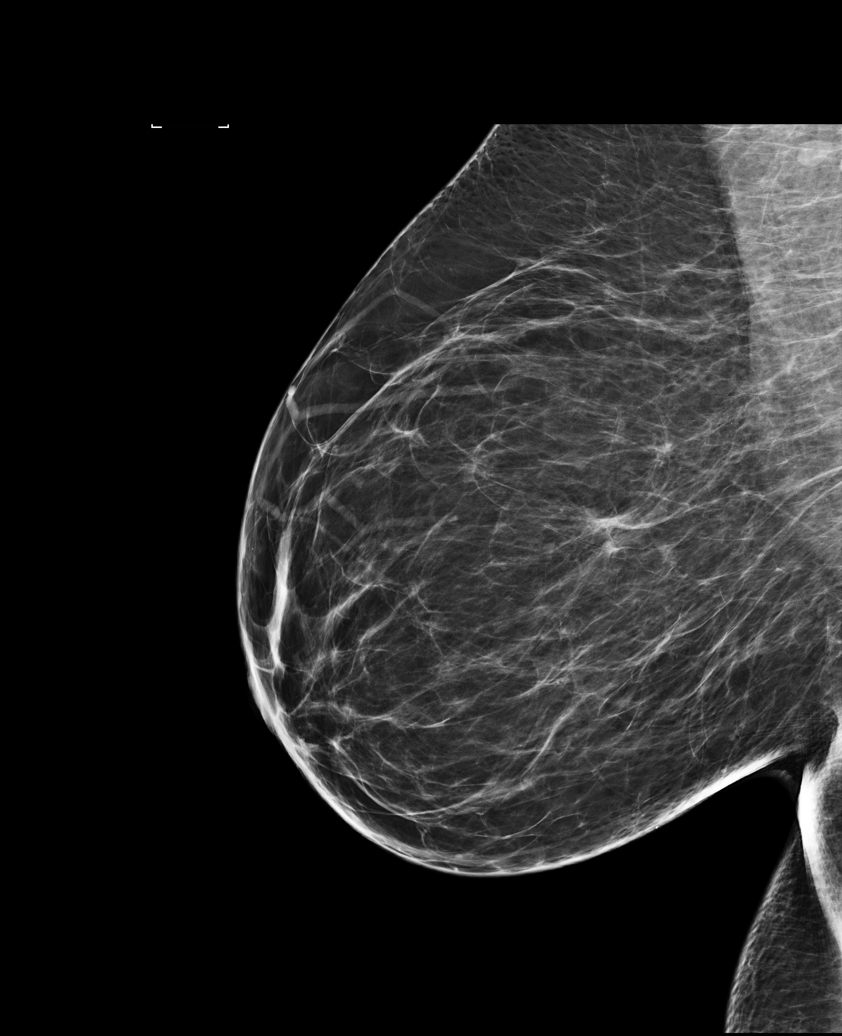

[L CC (2 of 2)]
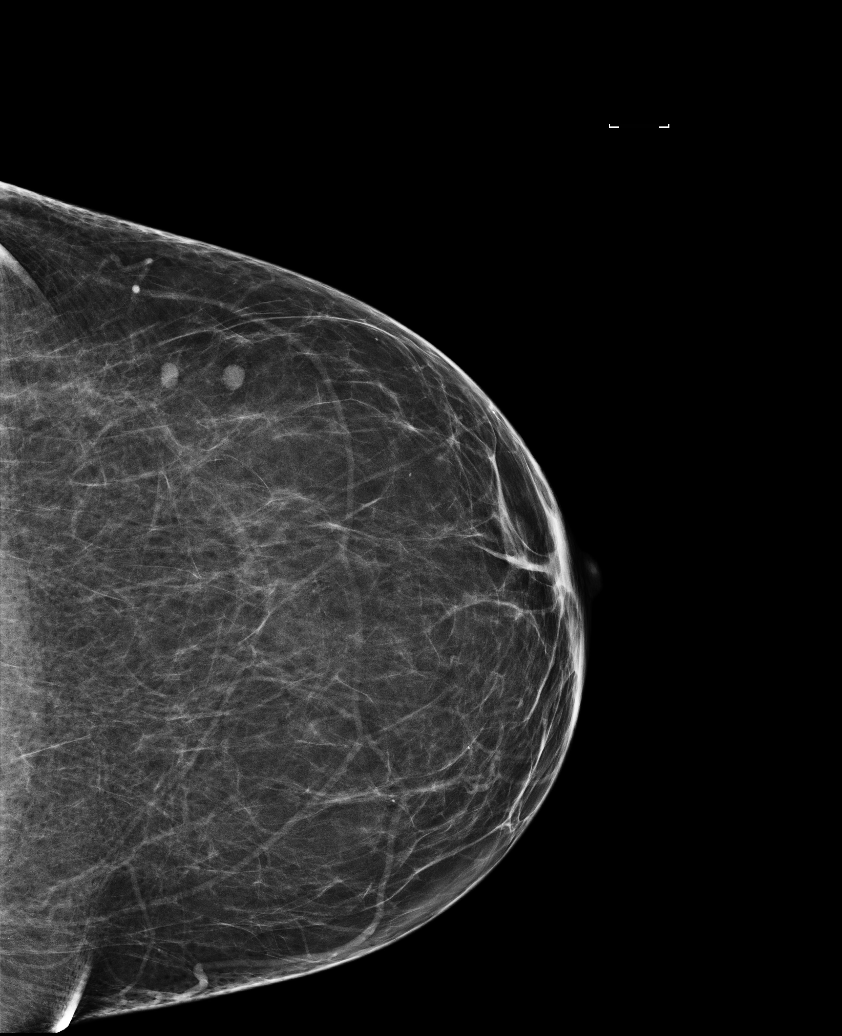

[R CC]
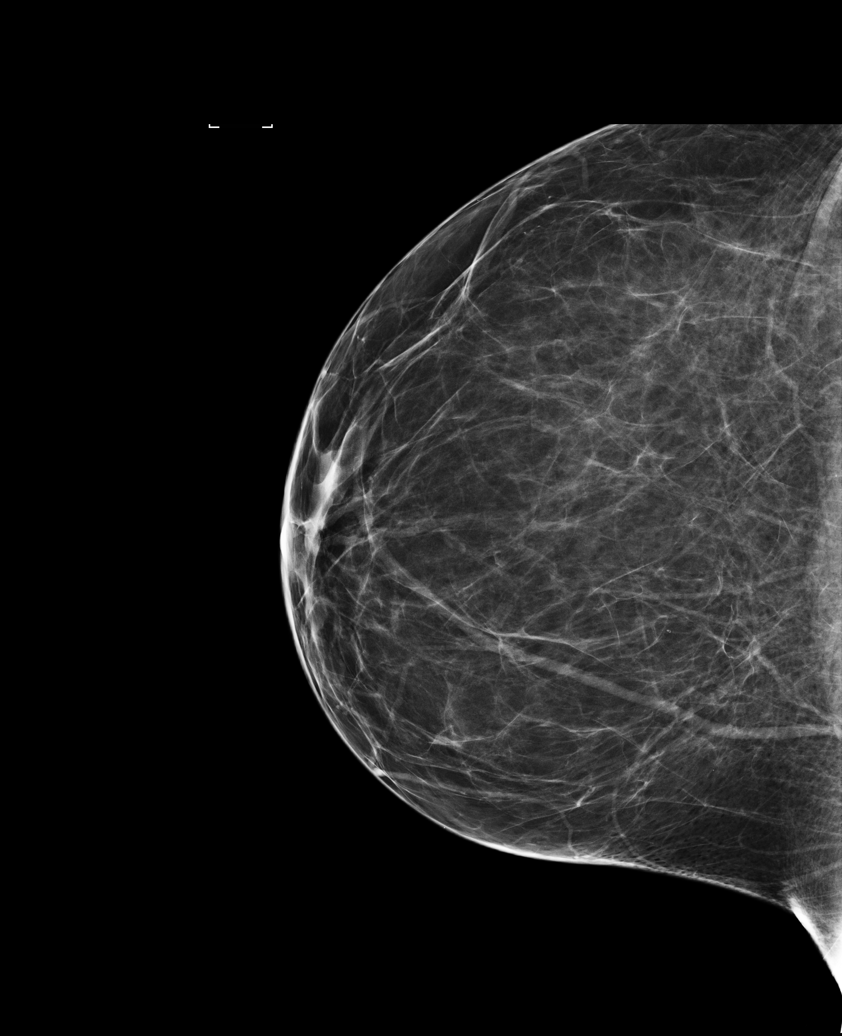

[L MLO]
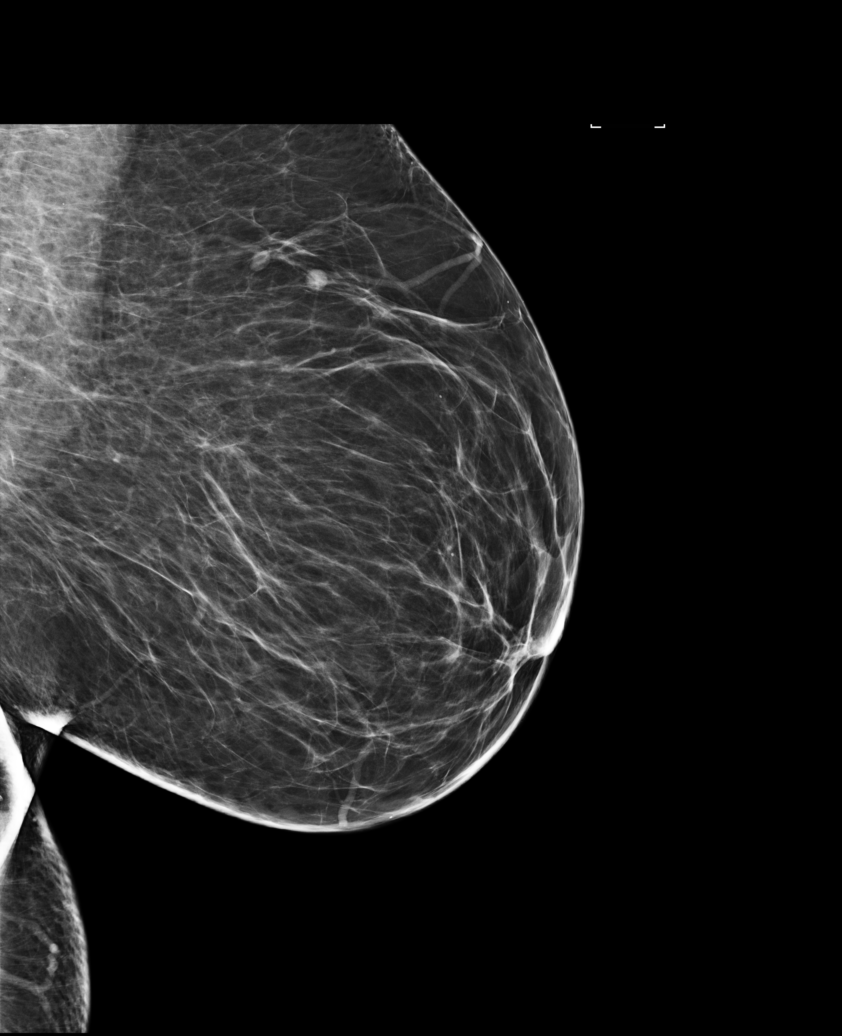

[5 of 5 positions shown; findings below may reference images not displayed]

ACR Breast Density Category b: There are scattered areas of
fibroglandular density.
FINDINGS: There are no findings suspicious for malignancy. The images were
evaluated with computer-aided detection.
IMPRESSION: No mammographic evidence of malignancy. A result letter of this
screening mammogram will be mailed directly to the patient.

RECOMMENDATION:
Screening mammogram in one year. (Code:[id number])

BI-RADS CATEGORY  1: Negative.

## 2022-03-30 ENCOUNTER — Telehealth: Payer: No Typology Code available for payment source | Admitting: Family Medicine

## 2022-03-30 DIAGNOSIS — U071 COVID-19: Secondary | ICD-10-CM

## 2022-03-30 NOTE — Progress Notes (Signed)
Virtual Visit Consent   BLAKELI SAMPLEY, you are scheduled for a virtual visit with a [facility] provider today. Just as with appointments in the office, your consent must be obtained to participate. Your consent will be active for this visit and any virtual visit you may have with one of our providers in the next 365 days. If you have a MyChart account, a copy of this consent can be sent to you electronically.  As this is a virtual visit, video technology does not allow for your provider to perform a traditional examination. This may limit your provider's ability to fully assess your condition. If your provider identifies any concerns that need to be evaluated in person or the need to arrange testing (such as labs, EKG, etc.), we will make arrangements to do so. Although advances in technology are sophisticated, we cannot ensure that it will always work on either your end or our end. If the connection with a video visit is poor, the visit may have to be switched to a telephone visit. With either a video or telephone visit, we are not always able to ensure that we have a secure connection.  By engaging in this virtual visit, you consent to the provision of healthcare and authorize for your insurance to be billed (if applicable) for the services provided during this visit. Depending on your insurance coverage, you may receive a charge related to this service.  I need to obtain your verbal consent now. Are you willing to proceed with your visit today? Rachel Cortez has provided verbal consent on 03/30/2022 for a virtual visit (video or telephone). Dellia Nims, FNP  Date: 03/30/2022 4:42 PM  Virtual Visit via Video Note   I, Dellia Nims, connected with  Rachel Cortez  ([id number], 31-Aug-1965) on 03/30/22 at  4:45 PM EDT by a video-enabled telemedicine application and verified that I am speaking with the correct person using two identifiers.  Location: Patient: Virtual Visit Location Patient: Home Provider:  Virtual Visit Location Provider: Home Office   I discussed the limitations of evaluation and management by telemedicine and the availability of in person appointments. The patient expressed understanding and agreed to proceed.    History of Present Illness: Rachel Cortez is a 57 y.o. who identifies as a female who was assigned female at birth, and is being seen today for positive covid testing, She started with headache and fever today. No wheezing, cough or sob. Marland Kitchen  HPI: HPI  Problems:  Patient Active Problem List   Diagnosis Date Noted   Healthcare maintenance 11/06/2021   Chronic bilateral low back pain without sciatica 10/07/2018   Chronic pain of both knees 10/07/2018   Elevated BP without diagnosis of hypertension 08/26/2018   HYPERCHOLESTEROLEMIA 03/07/2006   Obesity (BMI 30-39.9) 03/07/2006   Migraine 03/07/2006    Allergies:  Allergies  Allergen Reactions   Codeine Nausea And Vomiting and Rash    Childhood reaction   Crab [Shellfish Allergy] Rash   Penicillins Nausea And Vomiting and Rash    Childhood reaction   Medications:  Current Outpatient Medications:    benzonatate (TESSALON) 100 MG capsule, Take 1 capsule (100 mg total) by mouth 3 (three) times daily as needed., Disp: 30 capsule, Rfl: 0   Blood Pressure KIT, 1 kit by Does not apply route in the morning and at bedtime., Disp: 1 kit, Rfl: 0   fluticasone (FLONASE) 50 MCG/ACT nasal spray, Place 2 sprays into both nostrils daily., Disp: 16 g, Rfl:  6   prochlorperazine (COMPAZINE) 5 MG tablet, Take 1 tablet (5 mg total) by mouth every 6 (six) hours as needed for nausea or vomiting., Disp: 15 tablet, Rfl: 0   rizatriptan (MAXALT) 10 MG tablet, Take 1 tablet (10 mg total) by mouth as needed for migraine. May repeat in 2 hours if needed, Disp: 10 tablet, Rfl: 0   rosuvastatin (CRESTOR) 20 MG tablet, Take 1 tablet (20 mg total) by mouth daily., Disp: 90 tablet, Rfl: 3  Observations/Objective: Patient is well-developed,  well-nourished in no acute distress.  Resting comfortably  at home.  Head is normocephalic, atraumatic.  No labored breathing.  Speech is clear and coherent with logical content.  Patient is alert and oriented at baseline.    Assessment and Plan: 1. COVID  Increase fluids, humidifier at night, tylenol or ibuprofen as directed, quarantine discussed, UC if sx worsen.   Follow Up Instructions: I discussed the assessment and treatment plan with the patient. The patient was provided an opportunity to ask questions and all were answered. The patient agreed with the plan and demonstrated an understanding of the instructions.  A copy of instructions were sent to the patient via MyChart unless otherwise noted below.     The patient was advised to call back or seek an in-person evaluation if the symptoms worsen or if the condition fails to improve as anticipated.  Time:  I spent 10 minutes with the patient via telehealth technology discussing the above problems/concerns.    Dellia Nims, FNP

## 2022-03-30 NOTE — Patient Instructions (Signed)
COVID-19 COVID-19 is an infection caused by a virus called SARS-CoV-2. Most people who get COVID-19 have mild to moderate symptoms. Some have little to no symptoms. In others, the virus may cause a severe infection. What are the causes? COVID-19 is caused by a coronavirus. The virus may be in the air as droplets or as tiny specks of fluid (aerosols). It may also be on surfaces. You may catch the virus if you: Breathe in droplets when a person with COVID-19 breathes, speaks, sings, coughs, or sneezes. Touch something that has the virus on it and then touch your mouth, nose, or eyes. What increases the risk? Risk for infection: You are more likely to get COVID-19 if: You are within 6 ft (1.8 m) of a person who has COVID-19 for 15 minutes or longer. You provide care to a person who has COVID-19. You are in close contact with others. This includes hugging, kissing, or sharing utensils. Risk for serious illness caused by COVID-19: You are more likely to get very ill from COVID-19 if: You have cancer. You have a long-term (chronic) disease. This may be: A chronic lung disease, such as pulmonary embolism, chronic obstructive pulmonary disease (COPD), or cystic fibrosis. A disease that affects your body's defense system (immune system). If you have a weak immune system, you are said to be immunocompromised. A serious heart condition, such as heart failure, coronary artery disease, or cardiomyopathy. Diabetes. Chronic kidney disease. A liver disease, such as cirrhosis, nonalcoholic fatty liver disease, alcoholic liver disease, or autoimmune hepatitis. You are obese. You are pregnant or were just pregnant. You have sickle cell disease. What are the signs or symptoms? Symptoms of COVID-19 can range from mild to severe. They may appear any time from 2 to 14 days after you are exposed. They include: Fever or chills. Shortness of breath or trouble breathing. Feeling tired. Headaches, body aches, or  muscle aches. A runny or stuffy nose. Sneezing, coughing, or a sore throat. New loss of taste or smell. You may also have stomach problems, such as nausea, vomiting, or diarrhea. In some cases, you may not have any symptoms. How is this diagnosed? COVID-19 may be diagnosed by testing a sample to check for the virus. The most common tests are the PCR test and the antigen test. Tests may be done in the lab or at home. They include: Using a swab to take a sample of fluid from your nose. Testing a sample of saliva from your mouth. Testing a sample of mucus from your lungs (sputum). How is this treated? Treatment for COVID-19 depends on how severe your condition is. Mild symptoms can be treated at home. You should rest, drink fluids, and take over-the-counter medicine. If you have symptoms and risk factors, you may be prescribed a medicine that fights viruses (antiviral). Severe symptoms may be treated in a hospital intensive care unit (ICU). Treatment may include: Extra oxygen given through a tube in the nose, a face mask, or a hood. Medicines. These may include: Antivirals, such as remdesivir. Anti-inflammatories, such as corticosteroids. These help reduce inflammation. Antithrombotics. These help prevent or treat blood clots. Convalescent plasma. This helps boost your immune system. Prone positioning. This is when you are laid on your stomach to help oxygen get into your lungs. Infection control measures. If you are at risk for a more serious illness, your health care provider may prescribe two medicines to help your immune system protect you. These are called long-acting monoclonal antibodies. They are given together   every 6 months. How is this prevented? To protect yourself: Get the vaccine or vaccine series if you meet the guidelines. You can even get the vaccine while you are pregnant or making breast milk (lactating). Get an added dose of the vaccine if you are immunocompromised. This  applies if you have had an organ transplant or if you have a condition that affects your immune system. You should get the added dose 4 weeks after you got the first one. If you get an mRNA vaccine, you will need to get 3 doses. Talk to your provider about getting experimental monoclonal antibodies. This treatment can help prevent severe illness. It may be given to you if: You are immunocompromised. You cannot get the vaccine. You may not get the vaccine if you have a severe allergic reaction to it or to what it is made of. You are not fully vaccinated. You are in a place where there is COVID-19 and: You are in close contact with someone who has COVID-19. You are at high risk of being exposed. You are at risk of illness from new variants of the virus. To protect others: If you have symptoms of COVID-19, take steps to stop the virus from spreading. Stay home. Leave your house only to get medical care. Do not use public transit. Do not travel while you are sick. Wash your hands often with soap and water for at least 20 seconds. If soap and water are not available, use alcohol-based hand sanitizer. Make sure that all people in your household wash their hands well and often. Cough or sneeze into a tissue or your sleeve or elbow. Do not cough or sneeze into your hand or into the air. Where to find more information Centers for Disease Control and Prevention (CDC): cdc.gov World Health Organization (WHO): who.int Get help right away if: You have trouble breathing. You have pain or pressure in your chest. You are confused. Your lips or fingernails turn blue. You have trouble waking from sleep. Your symptoms get worse. These symptoms may be an emergency. Get help right away. Call 911. Do not wait to see if the symptoms will go away. Do not drive yourself to the hospital. This information is not intended to replace advice given to you by your health care provider. Make sure you discuss any  questions you have with your health care provider. Document Revised: 09/08/2021 Document Reviewed: 09/08/2021 Elsevier Patient Education  2023 Elsevier Inc.  

## 2022-03-31 ENCOUNTER — Encounter: Payer: Self-pay | Admitting: Student

## 2022-07-01 ENCOUNTER — Encounter: Payer: Self-pay | Admitting: Nurse Practitioner

## 2022-07-01 ENCOUNTER — Telehealth: Payer: Medicaid Other | Admitting: Nurse Practitioner

## 2022-07-01 DIAGNOSIS — G8929 Other chronic pain: Secondary | ICD-10-CM | POA: Diagnosis not present

## 2022-07-01 DIAGNOSIS — M5441 Lumbago with sciatica, right side: Secondary | ICD-10-CM | POA: Diagnosis not present

## 2022-07-01 DIAGNOSIS — M5442 Lumbago with sciatica, left side: Secondary | ICD-10-CM

## 2022-07-01 MED ORDER — METHOCARBAMOL 500 MG PO TABS: 500.0000 mg | ORAL_TABLET | Freq: Three times a day (TID) | ORAL | 0 refills | Status: DC | PRN

## 2022-07-01 MED ORDER — MELOXICAM 7.5 MG PO TABS: 7.5000 mg | ORAL_TABLET | Freq: Every day | ORAL | 0 refills | Status: DC

## 2022-07-01 NOTE — Patient Instructions (Signed)
Rachel Cortez, thank you for joining Rachel Rigg, NP for today's virtual visit.  While this provider is not your primary care provider (PCP), if your PCP is located in our provider database this encounter information will be shared with them immediately following your visit.   A [facility] MyChart account gives you access to today's visit and all your visits, tests, and labs performed at [facility] " click here if you don't have a [facility] MyChart account or go to [email or web address]  Consent: (Patient) Rachel Cortez provided verbal consent for this virtual visit at the beginning of the encounter.  Current Medications:  Current Outpatient Medications:    meloxicam (MOBIC) 7.5 MG tablet, Take 1 tablet (7.5 mg total) by mouth daily., Disp: 30 tablet, Rfl: 0   methocarbamol (ROBAXIN) 500 MG tablet, Take 1 tablet (500 mg total) by mouth every 8 (eight) hours as needed for muscle spasms., Disp: 30 tablet, Rfl: 0   benzonatate (TESSALON) 100 MG capsule, Take 1 capsule (100 mg total) by mouth 3 (three) times daily as needed., Disp: 30 capsule, Rfl: 0   Blood Pressure KIT, 1 kit by Does not apply route in the morning and at bedtime., Disp: 1 kit, Rfl: 0   fluticasone (FLONASE) 50 MCG/ACT nasal spray, Place 2 sprays into both nostrils daily., Disp: 16 g, Rfl: 6   prochlorperazine (COMPAZINE) 5 MG tablet, Take 1 tablet (5 mg total) by mouth every 6 (six) hours as needed for nausea or vomiting., Disp: 15 tablet, Rfl: 0   rizatriptan (MAXALT) 10 MG tablet, Take 1 tablet (10 mg total) by mouth as needed for migraine. May repeat in 2 hours if needed, Disp: 10 tablet, Rfl: 0   rosuvastatin (CRESTOR) 20 MG tablet, Take 1 tablet (20 mg total) by mouth daily., Disp: 90 tablet, Rfl: 3   Medications ordered in this encounter:  Meds ordered this encounter  Medications   meloxicam (MOBIC) 7.5 MG tablet    Sig: Take 1 tablet (7.5 mg total) by mouth daily.    Dispense:  30 tablet    Refill:   0    Order Specific Question:   Supervising Provider    Answer:   Merrilee Jansky [[id number]]   methocarbamol (ROBAXIN) 500 MG tablet    Sig: Take 1 tablet (500 mg total) by mouth every 8 (eight) hours as needed for muscle spasms.    Dispense:  30 tablet    Refill:  0    Order Specific Question:   Supervising Provider    Answer:   Merrilee Jansky [id number]     *If you need refills on other medications prior to your next appointment, please contact your pharmacy*  Follow-Up: Call back or seek an in-person evaluation if the symptoms worsen or if the condition fails to improve as anticipated.  [facility] [phone]  Other Instructions  Work on losing weight to help reduce back and hip pain. May alternate with heat and ice application for pain relief. May also alternate with acetaminophen as prescribed for back pain. Other alternatives include massage, acupuncture and water aerobics.     If you have been instructed to have an in-person evaluation today at a local Urgent Care facility, please use the link below. It will take you to a list of all of our available [facility], including address, phone number and hours of operation. Please do not delay care.  [facility]  If you or  a family member do not have a primary care provider, use the link below to schedule a visit and establish care. When you choose a [facility] primary care physician or advanced practice provider, you gain a long-term partner in health. Find a Primary Care Provider  Learn more about [facility] in-office and virtual care options: [facility] - Get Care Now

## 2022-07-01 NOTE — Progress Notes (Signed)
Virtual Visit Consent   Rachel Cortez, you are scheduled for a virtual visit with a  provider today. Just as with appointments in the office, your consent must be obtained to participate. Your consent will be active for this visit and any virtual visit you may have with one of our providers in the next 365 days. If you have a MyChart account, a copy of this consent can be sent to you electronically.  As this is a virtual visit, video technology does not allow for your provider to perform a traditional examination. This may limit your provider's ability to fully assess your condition. If your provider identifies any concerns that need to be evaluated in person or the need to arrange testing (such as labs, EKG, etc.), we will make arrangements to do so. Although advances in technology are sophisticated, we cannot ensure that it will always work on either your end or our end. If the connection with a video visit is poor, the visit may have to be switched to a telephone visit. With either a video or telephone visit, we are not always able to ensure that we have a secure connection.  By engaging in this virtual visit, you consent to the provision of healthcare and authorize for your insurance to be billed (if applicable) for the services provided during this visit. Depending on your insurance coverage, you may receive a charge related to this service.  I need to obtain your verbal consent now. Are you willing to proceed with your visit today? DONAE KUEKER has provided verbal consent on 07/01/2022 for a virtual visit (video or telephone). Claiborne Rigg, NP  Date: 07/01/2022 10:31 AM  Virtual Visit via Video Note   I, Claiborne Rigg, connected with  Rachel Cortez  ([id number], 12-06-1965) on 07/01/22 at 10:30 AM EDT by a video-enabled telemedicine application and verified that I am speaking with the correct person using two identifiers.  Location: Patient: Virtual Visit Location Patient:  Home Provider: Virtual Visit Location Provider: Home Office   I discussed the limitations of evaluation and management by telemedicine and the availability of in person appointments. The patient expressed understanding and agreed to proceed.    History of Present Illness: Rachel Cortez is a 57 y.o. who identifies as a female who was assigned female at birth, and is being seen today for hip pain.  Ms. [place] endorses a history of chronic low back pain. She is now also experiencing bilateral hip pain with pain radiating down both legs L>R. 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 Currently taking tylenol for pain.   Problems:  Patient Active Problem List   Diagnosis Date Noted   Healthcare maintenance 11/06/2021   Chronic bilateral low back pain without sciatica 10/07/2018   Chronic pain of both knees 10/07/2018   Elevated BP without diagnosis of hypertension 08/26/2018   HYPERCHOLESTEROLEMIA 03/07/2006   Obesity (BMI 30-39.9) 03/07/2006   Migraine 03/07/2006    Allergies:  Allergies  Allergen Reactions   Codeine Nausea And Vomiting and Rash    Childhood reaction   Crab [Shellfish Allergy] Rash   Penicillins Nausea And Vomiting and Rash    Childhood reaction   Medications:  Current Outpatient Medications:    meloxicam (MOBIC) 7.5 MG tablet, Take 1 tablet (7.5 mg total) by mouth daily., Disp: 30 tablet, Rfl: 0   methocarbamol (ROBAXIN) 500 MG tablet, Take 1 tablet (500 mg total) by mouth every 8 (eight) hours  as needed for muscle spasms., Disp: 30 tablet, Rfl: 0   benzonatate (TESSALON) 100 MG capsule, Take 1 capsule (100 mg total) by mouth 3 (three) times daily as needed., Disp: 30 capsule, Rfl: 0   Blood Pressure KIT, 1 kit by Does not apply route in the morning and at bedtime., Disp: 1 kit, Rfl: 0   fluticasone (FLONASE) 50 MCG/ACT nasal spray, Place 2 sprays into both nostrils daily., Disp: 16 g, Rfl: 6   prochlorperazine (COMPAZINE) 5 MG tablet, Take 1  tablet (5 mg total) by mouth every 6 (six) hours as needed for nausea or vomiting., Disp: 15 tablet, Rfl: 0   rizatriptan (MAXALT) 10 MG tablet, Take 1 tablet (10 mg total) by mouth as needed for migraine. May repeat in 2 hours if needed, Disp: 10 tablet, Rfl: 0   rosuvastatin (CRESTOR) 20 MG tablet, Take 1 tablet (20 mg total) by mouth daily., Disp: 90 tablet, Rfl: 3  Observations/Objective: Patient is well-developed, well-nourished in no acute distress.  Resting comfortably at home.  Head is normocephalic, atraumatic.  No labored breathing.  Speech is clear and coherent with logical content.  Patient is alert and oriented at baseline.    Assessment and Plan: 1. Chronic bilateral low back pain with bilateral sciatica - meloxicam (MOBIC) 7.5 MG tablet; Take 1 tablet (7.5 mg total) by mouth daily.  Dispense: 30 tablet; Refill: 0 - methocarbamol (ROBAXIN) 500 MG tablet; Take 1 tablet (500 mg total) by mouth every 8 (eight) hours as needed for muscle spasms.  Dispense: 30 tablet; Refill: 0  Work on losing weight to help reduce back and hip pain. May alternate with heat and ice application for pain relief. May also alternate with acetaminophen as prescribed for back pain. Other alternatives include massage, acupuncture and water aerobics.     Follow Up Instructions: I discussed the assessment and treatment plan with the patient. The patient was provided an opportunity to ask questions and all were answered. The patient agreed with the plan and demonstrated an understanding of the instructions.  A copy of instructions were sent to the patient via MyChart unless otherwise noted below.    The patient was advised to call back or seek an in-person evaluation if the symptoms worsen or if the condition fails to improve as anticipated.  Time:  I spent 11 minutes with the patient via telehealth technology discussing the above problems/concerns.    Claiborne Rigg, NP

## 2022-07-02 ENCOUNTER — Encounter: Payer: Self-pay | Admitting: Nurse Practitioner

## 2022-07-03 ENCOUNTER — Encounter: Payer: Self-pay | Admitting: Nurse Practitioner

## 2022-07-04 ENCOUNTER — Telehealth: Payer: Medicaid Other | Admitting: Nurse Practitioner

## 2022-07-04 DIAGNOSIS — J040 Acute laryngitis: Secondary | ICD-10-CM

## 2022-07-04 MED ORDER — PREDNISONE 20 MG PO TABS: 40.0000 mg | ORAL_TABLET | Freq: Every day | ORAL | 0 refills | Status: AC

## 2022-07-04 NOTE — Progress Notes (Signed)
Virtual Visit Consent   Rachel Cortez, you are scheduled for a virtual visit with Mary-Margaret Daphine Deutscher, FNP, a [facility] provider, today.     Just as with appointments in the office, your consent must be obtained to participate.  Your consent will be active for this visit and any virtual visit you may have with one of our providers in the next 365 days.     If you have a MyChart account, a copy of this consent can be sent to you electronically.  All virtual visits are billed to your insurance company just like a traditional visit in the office.    As this is a virtual visit, video technology does not allow for your provider to perform a traditional examination.  This may limit your provider's ability to fully assess your condition.  If your provider identifies any concerns that need to be evaluated in person or the need to arrange testing (such as labs, EKG, etc.), we will make arrangements to do so.     Although advances in technology are sophisticated, we cannot ensure that it will always work on either your end or our end.  If the connection with a video visit is poor, the visit may have to be switched to a telephone visit.  With either a video or telephone visit, we are not always able to ensure that we have a secure connection.     I need to obtain your verbal consent now.   Are you willing to proceed with your visit today? YES   Rachel Cortez has provided verbal consent on 07/04/2022 for a virtual visit (video or telephone).   Mary-Margaret Daphine Deutscher, FNP   Date: 07/04/2022 9:09 AM   Virtual Visit via Video Note   I, Mary-Margaret Daphine Deutscher, connected with Rachel Cortez ([id number], 08/28/1965) on 07/04/22 at  9:15 AM EDT by a video-enabled telemedicine application and verified that I am speaking with the correct person using two identifiers.  Location: Patient: Virtual Visit Location Patient: Home Provider: Virtual Visit Location Provider: Mobile   I discussed the limitations of evaluation  and management by telemedicine and the availability of in person appointments. The patient expressed understanding and agreed to proceed.    History of Present Illness: Rachel Cortez is a 57 y.o. who identifies as a female who was assigned female at birth, and is being seen today for laryngitis.  HPI: Patient woke up with laryngitis this morning. Scratchy throat. No fever, cough on congestion. Has slight sinus drainage.    Review of Systems  Constitutional:  Negative for diaphoresis and weight loss.  Eyes:  Negative for blurred vision, double vision and pain.  Respiratory:  Negative for shortness of breath.   Cardiovascular:  Negative for chest pain, palpitations, orthopnea and leg swelling.  Gastrointestinal:  Negative for abdominal pain.  Skin:  Negative for rash.  Neurological:  Negative for dizziness, sensory change, loss of consciousness, weakness and headaches.  Endo/Heme/Allergies:  Negative for polydipsia. Does not bruise/bleed easily.  Psychiatric/Behavioral:  Negative for memory loss. The patient does not have insomnia.   All other systems reviewed and are negative.   Problems:  Patient Active Problem List   Diagnosis Date Noted   Healthcare maintenance 11/06/2021   Chronic bilateral low back pain without sciatica 10/07/2018   Chronic pain of both knees 10/07/2018   Elevated BP without diagnosis of hypertension 08/26/2018   HYPERCHOLESTEROLEMIA 03/07/2006   Obesity (BMI 30-39.9) 03/07/2006   Migraine 03/07/2006  Allergies:  Allergies  Allergen Reactions   Codeine Nausea And Vomiting and Rash    Childhood reaction   Crab [Shellfish Allergy] Rash   Penicillins Nausea And Vomiting and Rash    Childhood reaction   Medications:  Current Outpatient Medications:    benzonatate (TESSALON) 100 MG capsule, Take 1 capsule (100 mg total) by mouth 3 (three) times daily as needed., Disp: 30 capsule, Rfl: 0   Blood Pressure KIT, 1 kit by Does not apply route in the morning and  at bedtime., Disp: 1 kit, Rfl: 0   fluticasone (FLONASE) 50 MCG/ACT nasal spray, Place 2 sprays into both nostrils daily., Disp: 16 g, Rfl: 6   meloxicam (MOBIC) 7.5 MG tablet, Take 1 tablet (7.5 mg total) by mouth daily., Disp: 30 tablet, Rfl: 0   methocarbamol (ROBAXIN) 500 MG tablet, Take 1 tablet (500 mg total) by mouth every 8 (eight) hours as needed for muscle spasms., Disp: 30 tablet, Rfl: 0   prochlorperazine (COMPAZINE) 5 MG tablet, Take 1 tablet (5 mg total) by mouth every 6 (six) hours as needed for nausea or vomiting., Disp: 15 tablet, Rfl: 0   rizatriptan (MAXALT) 10 MG tablet, Take 1 tablet (10 mg total) by mouth as needed for migraine. May repeat in 2 hours if needed, Disp: 10 tablet, Rfl: 0   rosuvastatin (CRESTOR) 20 MG tablet, Take 1 tablet (20 mg total) by mouth daily., Disp: 90 tablet, Rfl: 3  Observations/Objective: Patient is well-developed, well-nourished in no acute distress.  Resting comfortably  at home.  Head is normocephalic, atraumatic.  No labored breathing.  Speech is clear and coherent with logical content.  Patient is alert and oriented at baseline.    Assessment and Plan:  Rachel Cortez in today with chief complaint of Laryngitis   1. Laryngitis Force fluids Rest  Meds ordered this encounter  Medications   predniSONE (DELTASONE) 20 MG tablet    Sig: Take 2 tablets (40 mg total) by mouth daily with breakfast for 5 days. 2 po daily for 5 days    Dispense:  10 tablet    Refill:  0    Order Specific Question:   Supervising Provider    Answer:   Merrilee Jansky [id number]      Follow Up Instructions: I discussed the assessment and treatment plan with the patient. The patient was provided an opportunity to ask questions and all were answered. The patient agreed with the plan and demonstrated an understanding of the instructions.  A copy of instructions were sent to the patient via MyChart.  The patient was advised to call back or seek an in-person  evaluation if the symptoms worsen or if the condition fails to improve as anticipated.  Time:  I spent 5 minutes with the patient via telehealth technology discussing the above problems/concerns.    Mary-Margaret Daphine Deutscher, FNP

## 2022-07-04 NOTE — Patient Instructions (Signed)
Rachel Cortez, thank you for joining Bennie Pierini, FNP for today's virtual visit.  While this provider is not your primary care provider (PCP), if your PCP is located in our provider database this encounter information will be shared with them immediately following your visit.   A [facility] MyChart account gives you access to today's visit and all your visits, tests, and labs performed at [facility] " click here if you don't have a [facility] MyChart account or go to [email or web address]  Consent: (Patient) Rachel Cortez provided verbal consent for this virtual visit at the beginning of the encounter.  Current Medications:  Current Outpatient Medications:    predniSONE (DELTASONE) 20 MG tablet, Take 2 tablets (40 mg total) by mouth daily with breakfast for 5 days. 2 po daily for 5 days, Disp: 10 tablet, Rfl: 0   benzonatate (TESSALON) 100 MG capsule, Take 1 capsule (100 mg total) by mouth 3 (three) times daily as needed., Disp: 30 capsule, Rfl: 0   Blood Pressure KIT, 1 kit by Does not apply route in the morning and at bedtime., Disp: 1 kit, Rfl: 0   fluticasone (FLONASE) 50 MCG/ACT nasal spray, Place 2 sprays into both nostrils daily., Disp: 16 g, Rfl: 6   meloxicam (MOBIC) 7.5 MG tablet, Take 1 tablet (7.5 mg total) by mouth daily., Disp: 30 tablet, Rfl: 0   methocarbamol (ROBAXIN) 500 MG tablet, Take 1 tablet (500 mg total) by mouth every 8 (eight) hours as needed for muscle spasms., Disp: 30 tablet, Rfl: 0   prochlorperazine (COMPAZINE) 5 MG tablet, Take 1 tablet (5 mg total) by mouth every 6 (six) hours as needed for nausea or vomiting., Disp: 15 tablet, Rfl: 0   rizatriptan (MAXALT) 10 MG tablet, Take 1 tablet (10 mg total) by mouth as needed for migraine. May repeat in 2 hours if needed, Disp: 10 tablet, Rfl: 0   rosuvastatin (CRESTOR) 20 MG tablet, Take 1 tablet (20 mg total) by mouth daily., Disp: 90 tablet, Rfl: 3   Medications ordered in this encounter:   Meds ordered this encounter  Medications   predniSONE (DELTASONE) 20 MG tablet    Sig: Take 2 tablets (40 mg total) by mouth daily with breakfast for 5 days. 2 po daily for 5 days    Dispense:  10 tablet    Refill:  0    Order Specific Question:   Supervising Provider    Answer:   Merrilee Jansky [id number]     *If you need refills on other medications prior to your next appointment, please contact your pharmacy*  Follow-Up: Call back or seek an in-person evaluation if the symptoms worsen or if the condition fails to improve as anticipated.  [facility] [phone]  Other Instructions Drink lots of liquids Rest voice    If you have been instructed to have an in-person evaluation today at a local Urgent Care facility, please use the link below. It will take you to a list of all of our available [facility], including address, phone number and hours of operation. Please do not delay care.  [facility]  If you or a family member do not have a primary care provider, use the link below to schedule a visit and establish care. When you choose a [facility] primary care physician or advanced practice provider, you gain a long-term partner in health. Find a Primary Care Provider  Learn more about [facility]'s in-office and  virtual care options: [facility]

## 2022-07-10 ENCOUNTER — Encounter: Payer: Self-pay | Admitting: Student

## 2022-07-17 ENCOUNTER — Ambulatory Visit: Payer: Medicaid Other | Admitting: Family Medicine

## 2022-08-13 ENCOUNTER — Other Ambulatory Visit: Payer: Self-pay | Admitting: Family Medicine

## 2022-08-13 ENCOUNTER — Other Ambulatory Visit: Payer: Self-pay | Admitting: Student

## 2022-08-13 DIAGNOSIS — Z1231 Encounter for screening mammogram for malignant neoplasm of breast: Secondary | ICD-10-CM

## 2022-08-23 ENCOUNTER — Encounter: Payer: Self-pay | Admitting: Family Medicine

## 2022-08-23 ENCOUNTER — Ambulatory Visit
Admission: RE | Admit: 2022-08-23 | Discharge: 2022-08-23 | Disposition: A | Discharge status: Home / Self Care | Payer: Medicaid Other | Source: Ambulatory Visit

## 2022-08-23 ENCOUNTER — Other Ambulatory Visit (HOSPITAL_COMMUNITY)
Admission: RE | Admit: 2022-08-23 | Discharge: 2022-08-23 | Disposition: A | Discharge status: Home / Self Care | Payer: Medicaid Other | Source: Ambulatory Visit | Attending: Family Medicine | Admitting: Family Medicine

## 2022-08-23 ENCOUNTER — Ambulatory Visit (INDEPENDENT_AMBULATORY_CARE_PROVIDER_SITE_OTHER): Payer: Medicaid Other | Admitting: Family Medicine

## 2022-08-23 VITALS — BP 133/79 | HR 73 | Ht 69.0 in | Wt 215.2 lb

## 2022-08-23 DIAGNOSIS — Z124 Encounter for screening for malignant neoplasm of cervix: Secondary | ICD-10-CM | POA: Diagnosis not present

## 2022-08-23 DIAGNOSIS — R87619 Unspecified abnormal cytological findings in specimens from cervix uteri: Secondary | ICD-10-CM | POA: Insufficient documentation

## 2022-08-23 DIAGNOSIS — Z1231 Encounter for screening mammogram for malignant neoplasm of breast: Secondary | ICD-10-CM

## 2022-08-23 DIAGNOSIS — R87612 Low grade squamous intraepithelial lesion on cytologic smear of cervix (LGSIL): Secondary | ICD-10-CM | POA: Diagnosis not present

## 2022-08-23 DIAGNOSIS — S99829A Other specified injuries of unspecified foot, initial encounter: Secondary | ICD-10-CM

## 2022-08-23 NOTE — Assessment & Plan Note (Signed)
Previous pap smear (08/2021) demonstrated low grade squamous intraepithelial lesion that was not concerning at the time, recommended to follow up in a year.  - Pap smear collected today

## 2022-08-23 NOTE — Progress Notes (Signed)
    SUBJECTIVE:   CHIEF COMPLAINT / HPI:   Pap smear follow up Patient in clinic today to have repeat pap smear following an abnormal result from Aug 2023, which showed low grade squamous intraepithelial lesion. Discussed with patient about last year's results. Last sexual encounter a few months ago, not interested in STI/STD testing today.  Toenails  Patient has had a history of traumatic toenail injuries and has noticed toenail thickening starting in the big toes bilaterally and spreading to the nearby toes. Patient recalls previous normal biopsy of toenail a few years ago.   PERTINENT  PMH / PSH: Abnormal pap smear (Aug 2023), HTN, HLD  OBJECTIVE:   BP 133/79   Pulse 73   Ht 5\' 9"  (1.753 m)   Wt 215 lb 3.2 oz (97.6 kg)   LMP 08/08/2016   SpO2 100%   BMI 31.78 kg/m   General: Well-appearing, no acute distress CV: Warm and well-perfused. Pulm: Breathing comfortably on room air. No increased WOB. GU: Normal appearing cervix. No visible lesions. No palpable masses on bimanual exam.  Ext: Bilateral toenail thickening of multiple toes, no nail color irregularities. No erythema and no pain to palpation.  ASSESSMENT/PLAN:   Abnormal Pap smear of cervix Previous pap smear (08/2021) demonstrated low grade squamous intraepithelial lesion that was not concerning at the time, recommended to follow up in a year.  - Pap smear collected today  Traumatic fracture of toenail Patient's toenail changes do not appear to be from a fungal source at this time. - Placed referral for podiatry - Encouraged wearing wide toe-box shoes - Provided work note regarding shoewear  Patient to follow up in clinic as needed.  Ivery Quale, MD [facility]

## 2022-08-23 NOTE — Assessment & Plan Note (Signed)
Patient's toenail changes do not appear to be from a fungal source at this time. - Placed referral for podiatry - Encouraged wearing wide toe-box shoes - Provided work note regarding shoewear

## 2022-08-23 NOTE — Patient Instructions (Addendum)
Thank you for visiting clinic today - it was great to see you!  Today we completed a pap smear. We will contact you with the results.   We also placed a referral for you with Podiatry, who can work with you further regarding your toes. They should be calling you to schedule an appointment.   Please reach out any time with questions or concerns you may have - we are here for you!  Ivery Quale, MD

## 2022-08-28 ENCOUNTER — Telehealth: Payer: Self-pay

## 2022-08-28 NOTE — Telephone Encounter (Signed)
Patient calls nurse line requesting to speak with provider regarding recent PAP smear results.   Please return call to patient at [phone].  Veronda Prude, RN

## 2022-08-29 ENCOUNTER — Encounter: Payer: Self-pay | Admitting: Family Medicine

## 2022-08-30 NOTE — Telephone Encounter (Signed)
Patient calls nurse line again regards results.   She is very anxious as she viewed them on mychart and would like to discuss next steps.   Will forward to PCP.

## 2022-09-04 ENCOUNTER — Encounter: Payer: Self-pay | Admitting: Family Medicine

## 2022-09-06 ENCOUNTER — Ambulatory Visit: Payer: Medicaid Other | Admitting: Family Medicine

## 2022-09-06 ENCOUNTER — Other Ambulatory Visit (HOSPITAL_COMMUNITY)
Admission: RE | Admit: 2022-09-06 | Discharge: 2022-09-06 | Disposition: A | Discharge status: Home / Self Care | Payer: Medicaid Other | Source: Ambulatory Visit | Attending: Family Medicine | Admitting: Family Medicine

## 2022-09-06 VITALS — BP 158/82 | HR 69 | Ht 69.0 in | Wt 215.4 lb

## 2022-09-06 DIAGNOSIS — R87612 Low grade squamous intraepithelial lesion on cytologic smear of cervix (LGSIL): Secondary | ICD-10-CM

## 2022-09-06 DIAGNOSIS — N871 Moderate cervical dysplasia: Secondary | ICD-10-CM | POA: Diagnosis not present

## 2022-09-06 DIAGNOSIS — B977 Papillomavirus as the cause of diseases classified elsewhere: Secondary | ICD-10-CM | POA: Diagnosis not present

## 2022-09-06 NOTE — Patient Instructions (Signed)
Today you had a colposcopy with biopsy.  As we discussed you may have some mild cramping and spotting over the next 24 hours.  Taking over-the-counter medications for pain relief is recommended if you have cramping.  The biopsy sample was a little bit less than optimal in size but as you know we tried several repeat attempts at this.  I did an additional brush biopsy so we should get some information back.  I will let you know next week when I get this back.  If you have any questions or concerns or if you see the results before I do, please call the office.

## 2022-09-06 NOTE — Progress Notes (Deleted)
    SUBJECTIVE:   CHIEF COMPLAINT / HPI:   ***  PERTINENT  PMH / PSH: LSIL X2, HPV +  OBJECTIVE:   LMP 08/08/2016   ***  ASSESSMENT/PLAN:   There are no diagnoses linked to this encounter. No follow-ups on file.  Celine Mans, MD [facility]

## 2022-09-06 NOTE — Progress Notes (Deleted)
    SUBJECTIVE:   CHIEF COMPLAINT / HPI:   ***  PERTINENT  PMH / PSH: LSIL x2, HPV+  OBJECTIVE:   BP (!) 158/82   Pulse 69   Ht 5\' 9"  (1.753 m)   Wt 215 lb 6.4 oz (97.7 kg)   LMP 08/08/2016   SpO2 97%   BMI 31.81 kg/m   ***  ASSESSMENT/PLAN:   No problem-specific Assessment & Plan notes found for this encounter.     Cyndia Skeeters, DO [facility]

## 2022-09-07 NOTE — Progress Notes (Signed)
Patient given informed consent, signed copy in the chart.  Placed in lithotomy position. Cervix viewed with speculum and colposcope after application of acetic acid.   Colposcopy adequate (entire squamocolumnar junctions seen  in entirety) ?  No, lesion at 12: 10 positions seem to extend up into the canal beyond where I could ascertain the length of the lesion.  Positive acetowhite. Acetowhite lesions?  Yes Punctation?  No Mosaicism?  No Abnormal vasculature?  No Biopsies?  Yes.  Cervical biopsy at the 12 cm position was attempted multiple times.  The process the lesion was quite firm and was difficult to get a good sample.  There was a fair amount of bleeding.  I did submit a small sample. ECC?  Yes using Cytobrush.  Notably the lesion did extend into the endocervical canal. Complications?  Patient had some mild bleeding but hemostasis obtained easily.  COMMENTS: Patient was given post procedure instructions.  I will notify her of any pathology results.  Mildly abnormal colposcopy: Cervical biopsy attempted.  The sample seem very small.  For this reason I also did an ECC with a Cytobrush.  That is pending.  Additionally I added on molecular testing for subtyping of HPV.  I will discuss with patient next week.

## 2022-09-13 ENCOUNTER — Encounter: Payer: Self-pay | Admitting: Family Medicine

## 2022-09-13 LAB — CYTOLOGY - PAP
Chlamydia: NEGATIVE
Comment: NEGATIVE
Comment: NEGATIVE
Comment: NEGATIVE
Comment: NEGATIVE
Comment: NEGATIVE
Comment: NORMAL
HPV 16: NEGATIVE
HPV 18 / 45: NEGATIVE
High risk HPV: POSITIVE — AB
Neisseria Gonorrhea: NEGATIVE
Trichomonas: NEGATIVE

## 2022-09-13 LAB — SURGICAL PATHOLOGY

## 2022-09-14 ENCOUNTER — Telehealth: Payer: Self-pay | Admitting: Family Medicine

## 2022-09-14 NOTE — Telephone Encounter (Signed)
Tried to call patient about her biopsy results but her mailbox has not been set up.  I will send her a note through MyChart if she has set that up.  If not, I will try to call her back on Monday.

## 2022-09-14 NOTE — Telephone Encounter (Signed)
Patient calls nurse line returning phone call.   She reports she saw the mychart message and she would like to referred to GYN.   Will forward to [facility] for placement.

## 2022-09-18 ENCOUNTER — Other Ambulatory Visit: Payer: Self-pay | Admitting: Family Medicine

## 2022-09-18 DIAGNOSIS — R877 Abnormal histological findings in specimens from female genital organs: Secondary | ICD-10-CM

## 2022-09-18 NOTE — Progress Notes (Unsigned)
Results from colposcopy: Negative for HPV 16/18/45 strains of HPV   A. CERVIX, 12:00, BIOPSY:  Mild and moderate squamous dysplasia with HPV effect (HSIL, CIN-2)   B. ENDOCERVIX, CURETTAGE:  Minute detached fragment of atypical squamous epithelium, at least LSIL  Mucohemorrhagic debris with scant admixed benign endocervical epithelium  and endocervical cells   She has decided to go to GYN for further eval/tx and I have made referral. Due to her history of anxiety with abnormal test results I am asking for urgent referral/

## 2022-09-20 ENCOUNTER — Encounter: Payer: Self-pay | Admitting: Family Medicine

## 2022-09-21 NOTE — Telephone Encounter (Signed)
Has been done and expedited

## 2022-09-24 ENCOUNTER — Encounter: Payer: Self-pay | Admitting: Family Medicine

## 2022-09-30 ENCOUNTER — Encounter: Payer: Self-pay | Admitting: Family Medicine

## 2022-10-10 ENCOUNTER — Ambulatory Visit: Payer: Medicaid Other | Admitting: Podiatry

## 2022-10-10 ENCOUNTER — Telehealth: Payer: Self-pay | Admitting: Podiatry

## 2022-10-10 NOTE — Telephone Encounter (Signed)
Pt left message today at 542am stating she could not make appt today due to work.  I have cxled appt.

## 2022-10-28 ENCOUNTER — Encounter: Payer: Self-pay | Admitting: Family Medicine

## 2022-10-28 ENCOUNTER — Ambulatory Visit (HOSPITAL_COMMUNITY)
Admission: EM | Admit: 2022-10-28 | Discharge: 2022-10-28 | Disposition: A | Discharge status: Home / Self Care | Payer: Medicaid Other | Attending: Physician Assistant | Admitting: Physician Assistant

## 2022-10-28 ENCOUNTER — Encounter (HOSPITAL_COMMUNITY): Payer: Self-pay

## 2022-10-28 DIAGNOSIS — F439 Reaction to severe stress, unspecified: Secondary | ICD-10-CM

## 2022-10-28 DIAGNOSIS — M5442 Lumbago with sciatica, left side: Secondary | ICD-10-CM | POA: Diagnosis not present

## 2022-10-28 DIAGNOSIS — M5441 Lumbago with sciatica, right side: Secondary | ICD-10-CM

## 2022-10-28 DIAGNOSIS — N760 Acute vaginitis: Secondary | ICD-10-CM

## 2022-10-28 DIAGNOSIS — I10 Essential (primary) hypertension: Secondary | ICD-10-CM | POA: Diagnosis not present

## 2022-10-28 DIAGNOSIS — B9689 Other specified bacterial agents as the cause of diseases classified elsewhere: Secondary | ICD-10-CM

## 2022-10-28 MED ORDER — METRONIDAZOLE 500 MG PO TABS: 500.0000 mg | ORAL_TABLET | Freq: Two times a day (BID) | ORAL | 0 refills | Status: DC

## 2022-10-28 MED ORDER — BACLOFEN 10 MG PO TABS
10.0000 mg | ORAL_TABLET | Freq: Two times a day (BID) | ORAL | 0 refills | Status: DC | PRN
Start: 2022-10-28 — End: 2022-12-18

## 2022-10-28 NOTE — ED Provider Notes (Signed)
[facility]    CSN: [id number] Arrival date & time: 10/28/22  1002      History   Chief Complaint Chief Complaint  Patient presents with   Back Pain    HPI Rachel Cortez is a 57 y.o. female.   Patient presents today with a several day history of lower back pain that travels into both of her legs.  She also reports some pain in the anterior portion of her right hip.  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  This does tend to exacerbate her pain.  Pain is currently rated 7 but that is because she has been resting and at worst increases to 9, described as cramping, no aggravating relieving factors identified.  She has had similar episodes in the past that resolved with high-dose ibuprofen.  She has not tried any medications since her symptoms began.  She denies any recent trauma or falls.  She denies any numbness or paresthesias in her legs, bowel/bladder incontinence, lower extremity weakness, saddle anesthesia.  She denies any history of malignancy though she has had an abnormal Pap smear and is in the process of having this worked up.  Patient reports that she was recently seen by her OB/GYN for colposcopy and biopsy of her cervix given abnormal Pap smear.  This came back as L SIL and she noted on the results that she was positive for bacterial vaginosis.  She does report some vaginal discharge and so is requesting treatment.  Her blood pressure was noted to be elevated today.  She has had elevated readings in the past but has never been prescribed medication.  She denies any chest pain, shortness of breath, headache, vision change.  She does report increased stressors and believes that the pain is contributing to the elevated reading.  She is followed closely by primary care.    Past Medical History:  Diagnosis Date   Allergy    SEASONAL   Anemia    Heart  murmur    HTN (hypertension)    Obesity    Osgood-Schlatter/osteochondroses     Patient Active Problem List   Diagnosis Date Noted   Abnormal Pap smear of cervix 08/23/2022   Traumatic fracture of toenail 08/23/2022   Healthcare maintenance 11/06/2021   Chronic bilateral low back pain without sciatica 10/07/2018   Chronic pain of both knees 10/07/2018   Elevated BP without diagnosis of hypertension 08/26/2018   HYPERCHOLESTEROLEMIA 03/07/2006   Obesity (BMI 30-39.9) 03/07/2006   Migraine 03/07/2006    Past Surgical History:  Procedure Laterality Date   TUBAL LIGATION      OB History   No obstetric history on file.      Home Medications    Prior to Admission medications   Medication Sig Start Date End Date Taking? Authorizing Provider  baclofen (LIORESAL) 10 MG tablet Take 1 tablet (10 mg total) by mouth 2 (two) times daily as needed for muscle spasms. 10/28/22  Yes Jeron Grahn K, PA-C  metroNIDAZOLE (FLAGYL) 500 MG tablet Take 1 tablet (500 mg total) by mouth 2 (two) times daily. 10/28/22  Yes Aissatou Fronczak K, PA-C  Blood Pressure KIT 1 kit by Does not apply route in the morning and at bedtime. 09/02/21   Levin Erp, MD  fluticasone (FLONASE) 50 MCG/ACT nasal spray Place 2 sprays into both nostrils daily. 02/19/22  Viviano Simas, FNP  meloxicam (MOBIC) 7.5 MG tablet Take 1 tablet (7.5 mg total) by mouth daily. 07/01/22   Claiborne Rigg, NP  prochlorperazine (COMPAZINE) 5 MG tablet Take 1 tablet (5 mg total) by mouth every 6 (six) hours as needed for nausea or vomiting. 09/02/21   Levin Erp, MD  rizatriptan (MAXALT) 10 MG tablet Take 1 tablet (10 mg total) by mouth as needed for migraine. May repeat in 2 hours if needed 09/01/21   Levin Erp, MD  rosuvastatin (CRESTOR) 20 MG tablet Take 1 tablet (20 mg total) by mouth daily. 08/15/21   Maury Dus, MD    Family History Family History  Problem Relation Age of Onset   Diabetes Mother    Hypertension  Mother    Hyperlipidemia Mother    Hypertension Brother    Hyperlipidemia Brother    Diabetes Maternal Grandmother     Social History Social History   Tobacco Use   Smoking status: Never   Smokeless tobacco: Never  Substance Use Topics   Alcohol use: No   Drug use: No     Allergies   Codeine, Crab [shellfish allergy], and Penicillins   Review of Systems Review of Systems  Constitutional:  Positive for activity change. Negative for appetite change, fatigue and fever.  Eyes:  Negative for visual disturbance.  Respiratory:  Negative for shortness of breath.   Cardiovascular:  Negative for chest pain.  Gastrointestinal:  Negative for abdominal pain, diarrhea, nausea and vomiting.  Genitourinary:  Positive for vaginal discharge. Negative for vaginal bleeding and vaginal pain.  Musculoskeletal:  Positive for back pain. Negative for arthralgias and myalgias.  Neurological:  Negative for dizziness, light-headedness and headaches.     Physical Exam Triage Vital Signs ED Triage Vitals  Encounter Vitals Group     BP 10/28/22 1020 (!) 185/97     Systolic BP Percentile --      Diastolic BP Percentile --      Pulse Rate 10/28/22 1020 72     Resp 10/28/22 1020 18     Temp 10/28/22 1020 97.7 F (36.5 C)     Temp Source 10/28/22 1020 Oral     SpO2 10/28/22 1020 98 %     Weight --      Height --      Head Circumference --      Peak Flow --      Pain Score 10/28/22 1021 7     Pain Loc --      Pain Education --      Exclude from Growth Chart --    No data found.  Updated Vital Signs BP (!) 174/96 (BP Location: Right Arm)   Pulse 72   Temp 97.7 F (36.5 C) (Oral)   Resp 18   LMP 08/08/2016   SpO2 98%   Visual Acuity Right Eye Distance:   Left Eye Distance:   Bilateral Distance:    Right Eye Near:   Left Eye Near:    Bilateral Near:     Physical Exam Vitals reviewed.  Constitutional:      General: She is awake. She is not in acute distress.    Appearance:  Normal appearance. She is well-developed. She is not ill-appearing.     Comments: Very pleasant female appears stated age in no acute distress sitting comfortably in exam room  HENT:     Head: Normocephalic and atraumatic.  Cardiovascular:     Rate and Rhythm: Normal rate and regular rhythm.  Heart sounds: Normal heart sounds, S1 normal and S2 normal. No murmur heard. Pulmonary:     Effort: Pulmonary effort is normal.     Breath sounds: Normal breath sounds. No wheezing, rhonchi or rales.     Comments: Clear to auscultation bilaterally Abdominal:     General: Bowel sounds are normal.     Palpations: Abdomen is soft.     Tenderness: There is no abdominal tenderness. There is no right CVA tenderness, left CVA tenderness, guarding or rebound.     Comments: Benign abdominal exam  Musculoskeletal:     Cervical back: No tenderness or bony tenderness.     Thoracic back: No tenderness or bony tenderness.     Lumbar back: Spasms and tenderness present. No bony tenderness. Positive right straight leg raise test and positive left straight leg raise test.     Right hip: Tenderness present. No deformity or bony tenderness. Normal range of motion. Normal strength.     Comments: Back: Tenderness to palpation and spasm noted bilateral lumbar paraspinal muscles.  Positive straight leg raise at 30 degrees bilaterally.  Negative Faber bilaterally.  Strength 5/5 bilateral lower extremities.  Right hip: Tenderness over anterior right hip with painful nodule on inguinal crest.  Normal strength and gait.  No focal bony tenderness.   Psychiatric:        Behavior: Behavior is cooperative.      UC Treatments / Results  Labs (all labs ordered are listed, but only abnormal results are displayed) Labs Reviewed - No data to display  EKG   Radiology No results found.  Procedures Procedures (including critical care time)  Medications Ordered in UC Medications - No data to display  Initial  Impression / Assessment and Plan / UC Course  I have reviewed the triage vital signs and the nursing notes.  Pertinent labs & imaging results that were available during my care of the patient were reviewed by me and considered in my medical decision making (see chart for details).     Patient is well-appearing, afebrile, nontoxic, nontachycardic.  Plain films were deferred as patient denies any recent trauma and has no focal bony tenderness.  Suspect muscular etiology contributing to sciatic symptoms.  Given her elevated pressure I did recommend she avoid NSAIDs but can use Tylenol.  She was started on baclofen up to twice a day and we discussed that this can be sedating so she should not drive or drink alcohol taking it.  Also recommended heat and gentle stretch.  Discussed that if her symptoms are not improving within a week she should return for reevaluation.  If she has any worsening symptoms she needs to be seen immediately.  Strict return precautions given.  Work excuse note provided.  Patient did have findings suggestive of bacterial vaginosis on her cytology.  She does report current symptoms and so was started on metronidazole.  Discussed that she is not to drink alcohol on this medication.  She is to follow-up with her OB/GYN as scheduled.  Her blood pressure is very elevated today.  This has been elevated in the past but she believes this is situational as she is under a lot of stress and in pain.  She states that generally this runs 130/70.  Discussed that she should avoid NSAIDs, decongestants, caffeine, sodium.  She denies any alarm symptoms or signs of endorgan damage.  She would prefer to hold off on starting medication at this time and will monitor her blood pressure at home.  Discussed that if this remains elevated she would need to follow-up here or see her primary care to consider initiation of medication.  Discussed that if she develops any chest pain, shortness of breath, headache,  vision change, dizziness in the setting of high blood pressure she needs to be seen immediately.  Strict return precautions given.  Patient expects significant stress related to her abnormal Pap smear and became tearful when discussing this.  We had a long conversation about the evolution of cervical cancer and that generally this can be treated as long as it is caught early.  Impressed upon her the importance of following up with her OB/GYN.  Final Clinical Impressions(s) / UC Diagnoses   Final diagnoses:  Acute bilateral low back pain with bilateral sciatica  Bacterial vaginosis  Situational stress  Elevated blood pressure reading in office with diagnosis of hypertension     Discharge Instructions      Take baclofen up to twice a day for muscle spasms.  This will make you sleepy so do not drive drink alcohol with taking it.  I recommend a warm bath as well as gentle stretch.  If your symptoms are not improving quickly please follow-up with either our clinic or your primary care.  If anything worsens you have severe pain, difficulty walking, numbness or tingling in your legs, going to the bathroom on yourself without noticing it you need to be seen immediately.  I have called in treatment for bacterial vaginosis.  Take metronidazole twice daily for 7 days.  Do not drink any alcohol while on this medication for 3 days after completing course.  Follow-up with your OB/GYN as we discussed.  Your blood pressure is elevated.  Please monitor your blood pressure at home and if persistently above 140/90 follow-up with either our clinic or primary care to start medication.  If you develop any chest pain, shortness of breath, headache, vision change, dizziness in the setting of high blood pressure you need to go to the emergency room.  Avoid decongestants, caffeine, sodium, NSAIDs (aspirin, ibuprofen/Advil, naproxen/Aleve).     ED Prescriptions     Medication Sig Dispense Auth. Provider   baclofen  (LIORESAL) 10 MG tablet Take 1 tablet (10 mg total) by mouth 2 (two) times daily as needed for muscle spasms. 30 each Sevyn Markham K, PA-C   metroNIDAZOLE (FLAGYL) 500 MG tablet Take 1 tablet (500 mg total) by mouth 2 (two) times daily. 14 tablet Korrin Waterfield, Noberto Retort, PA-C      PDMP not reviewed this encounter.   Jeani Hawking, PA-C 10/28/22 1057

## 2022-10-28 NOTE — Discharge Instructions (Signed)
Take baclofen up to twice a day for muscle spasms.  This will make you sleepy so do not drive drink alcohol with taking it.  I recommend a warm bath as well as gentle stretch.  If your symptoms are not improving quickly please follow-up with either our clinic or your primary care.  If anything worsens you have severe pain, difficulty walking, numbness or tingling in your legs, going to the bathroom on yourself without noticing it you need to be seen immediately.  I have called in treatment for bacterial vaginosis.  Take metronidazole twice daily for 7 days.  Do not drink any alcohol while on this medication for 3 days after completing course.  Follow-up with your OB/GYN as we discussed.  Your blood pressure is elevated.  Please monitor your blood pressure at home and if persistently above 140/90 follow-up with either our clinic or primary care to start medication.  If you develop any chest pain, shortness of breath, headache, vision change, dizziness in the setting of high blood pressure you need to go to the emergency room.  Avoid decongestants, caffeine, sodium, NSAIDs (aspirin, ibuprofen/Advil, naproxen/Aleve).

## 2022-10-28 NOTE — ED Triage Notes (Addendum)
Pt with lower back pain radiating down her left leg that feels "crampy". States she has been told she has sciatica. Pt states she also has a pain in her right lower abdomen. Denies injury, denies urinary sx.

## 2022-10-31 ENCOUNTER — Ambulatory Visit: Payer: Medicaid Other | Admitting: Family Medicine

## 2022-11-06 ENCOUNTER — Other Ambulatory Visit: Payer: Self-pay

## 2022-11-06 ENCOUNTER — Ambulatory Visit (INDEPENDENT_AMBULATORY_CARE_PROVIDER_SITE_OTHER): Payer: Medicaid Other | Admitting: Obstetrics and Gynecology

## 2022-11-06 ENCOUNTER — Encounter: Payer: Self-pay | Admitting: Obstetrics and Gynecology

## 2022-11-06 VITALS — BP 162/80 | HR 57

## 2022-11-06 DIAGNOSIS — Z1330 Encounter for screening examination for mental health and behavioral disorders, unspecified: Secondary | ICD-10-CM | POA: Diagnosis not present

## 2022-11-06 DIAGNOSIS — N871 Moderate cervical dysplasia: Secondary | ICD-10-CM

## 2022-11-06 NOTE — Progress Notes (Signed)
NEW GYNECOLOGY PATIENT Patient name: Rachel Cortez MRN [id number]  Date of birth: 1965/03/23 Chief Complaint:   No chief complaint on file.     History:  Rachel Cortez is a 57 y.o. No obstetric history on file. being seen today for discussion of abnormal pap and cervical biopsy. Reviewed that biopsy demonstrates CIN2 and recommendation is for excisional treatment. Patient is postmenopausal therefore no concerns about future childbearing.      Gynecologic History Patient's last menstrual period was 08/08/2016. Contraception: post menopausal status Last Pap:     Component Value Date/Time   DIAGPAP - Low grade squamous intraepithelial lesion (LSIL) (A) 08/23/2022 1133   DIAGPAP - Low grade squamous intraepithelial lesion (LSIL) (A) 08/11/2021 0854   HPVHIGH Positive (A) 08/23/2022 1133   HPVHIGH Negative 08/11/2021 0854   ADEQPAP  08/23/2022 1133    Satisfactory for evaluation; transformation zone component PRESENT.   ADEQPAP  08/11/2021 0854    Satisfactory for evaluation; transformation zone component PRESENT.    FINAL MICROSCOPIC DIAGNOSIS:   A. CERVIX, 12:00, BIOPSY:  Mild and moderate squamous dysplasia with HPV effect (HSIL, CIN-2)   B. ENDOCERVIX, CURETTAGE:  Minute detached fragment of atypical squamous epithelium, at least LSIL  Mucohemorrhagic debris with scant admixed benign endocervical epithelium  and endocervical cells     Last Mammogram: BIRADS 1 08/2022 Last Colonoscopy:  04/2020 cologaurd  Obstetric History OB History  No obstetric history on file.    Past Medical History:  Diagnosis Date   Allergy    SEASONAL   Anemia    Heart murmur    HTN (hypertension)    Obesity    Osgood-Schlatter/osteochondroses     Past Surgical History:  Procedure Laterality Date   TUBAL LIGATION      Current Outpatient Medications on File Prior to Visit  Medication Sig Dispense Refill   baclofen (LIORESAL) 10 MG tablet Take 1 tablet (10 mg total) by mouth 2 (two)  times daily as needed for muscle spasms. 30 each 0   Blood Pressure KIT 1 kit by Does not apply route in the morning and at bedtime. 1 kit 0   fluticasone (FLONASE) 50 MCG/ACT nasal spray Place 2 sprays into both nostrils daily. 16 g 6   meloxicam (MOBIC) 7.5 MG tablet Take 1 tablet (7.5 mg total) by mouth daily. (Patient not taking: Reported on 11/06/2022) 30 tablet 0   metroNIDAZOLE (FLAGYL) 500 MG tablet Take 1 tablet (500 mg total) by mouth 2 (two) times daily. (Patient not taking: Reported on 11/06/2022) 14 tablet 0   prochlorperazine (COMPAZINE) 5 MG tablet Take 1 tablet (5 mg total) by mouth every 6 (six) hours as needed for nausea or vomiting. (Patient not taking: Reported on 11/06/2022) 15 tablet 0   rizatriptan (MAXALT) 10 MG tablet Take 1 tablet (10 mg total) by mouth as needed for migraine. May repeat in 2 hours if needed (Patient not taking: Reported on 11/06/2022) 10 tablet 0   rosuvastatin (CRESTOR) 20 MG tablet Take 1 tablet (20 mg total) by mouth daily. 90 tablet 3   No current facility-administered medications on file prior to visit.    Allergies  Allergen Reactions   Codeine Nausea And Vomiting and Rash    Childhood reaction   Crab [Shellfish Allergy] Rash   Penicillins Nausea And Vomiting and Rash    Childhood reaction    Social History:  reports that she has never smoked. She has never used smokeless tobacco. She reports that she does not  drink alcohol and does not use drugs.  Family History  Problem Relation Age of Onset   Diabetes Mother    Hypertension Mother    Hyperlipidemia Mother    Hypertension Brother    Hyperlipidemia Brother    Diabetes Maternal Grandmother     The following portions of the patient's history were reviewed and updated as appropriate: allergies, current medications, past family history, past medical history, past social history, past surgical history and problem list.  Review of Systems Pertinent items noted in HPI and remainder of  comprehensive ROS otherwise negative.  Physical Exam:  BP (!) 166/85   Pulse 65   LMP 08/08/2016  Physical Exam Vitals and nursing note reviewed.  Constitutional:      Appearance: Normal appearance.  Cardiovascular:     Rate and Rhythm: Normal rate.  Pulmonary:     Effort: Pulmonary effort is normal.     Breath sounds: Normal breath sounds.  Neurological:     General: No focal deficit present.     Mental Status: She is alert and oriented to person, place, and time.  Psychiatric:        Mood and Affect: Mood normal.        Behavior: Behavior normal.        Thought Content: Thought content normal.        Judgment: Judgment normal.        Assessment and Plan:   1. High grade squamous intraepithelial lesion (HGSIL), grade 2 CIN, on biopsy of cervix Reviewed that ASCCP guidelines recommends excisional treatment. Discussed in office LEEP for removal of abnormal cervical tissue and the typical follow up thereafter. Reviewed risks, benefits, and alternatives.    Routine preventative health maintenance measures emphasized. Please refer to After Visit Summary for other counseling recommendations.   Follow-up: Return for GYN Follow Up.      Lorriane Shire, MD Obstetrician & Gynecologist, [facility] [employer], [facility]

## 2022-11-11 ENCOUNTER — Other Ambulatory Visit: Payer: Self-pay | Admitting: Student

## 2022-11-11 DIAGNOSIS — G43119 Migraine with aura, intractable, without status migrainosus: Secondary | ICD-10-CM

## 2022-11-13 NOTE — Telephone Encounter (Signed)
Rx refill request approved. 

## 2022-11-28 ENCOUNTER — Encounter: Payer: Self-pay | Admitting: Obstetrics and Gynecology

## 2022-12-18 ENCOUNTER — Other Ambulatory Visit: Payer: Self-pay

## 2022-12-18 ENCOUNTER — Ambulatory Visit: Payer: Medicaid Other | Admitting: Obstetrics and Gynecology

## 2022-12-18 ENCOUNTER — Other Ambulatory Visit (HOSPITAL_COMMUNITY)
Admission: RE | Admit: 2022-12-18 | Discharge: 2022-12-18 | Disposition: A | Discharge status: Home / Self Care | Payer: Medicaid Other | Source: Ambulatory Visit | Attending: Obstetrics and Gynecology | Admitting: Obstetrics and Gynecology

## 2022-12-18 ENCOUNTER — Encounter: Payer: Self-pay | Admitting: Obstetrics and Gynecology

## 2022-12-18 VITALS — BP 135/85 | HR 69 | Wt 213.0 lb

## 2022-12-18 DIAGNOSIS — N871 Moderate cervical dysplasia: Secondary | ICD-10-CM | POA: Insufficient documentation

## 2022-12-18 DIAGNOSIS — Z1331 Encounter for screening for depression: Secondary | ICD-10-CM | POA: Diagnosis not present

## 2022-12-18 NOTE — Progress Notes (Signed)
   GYNECOLOGY OFFICE PROCEDURE NOTE  Rachel Cortez is a 57 y.o. No obstetric history on file. here for LEEP. No GYN concerns. Pap smear and colposcopy history reviewed.    Pap LSIL, HR HPV 08/2022 Colpo Biopsy CIN 2+ 08/2022 ECC minute fragments of LSIL and bening endocervical epithelium and cells 08/2022  Risks, benefits, alternatives, and limitations of procedure explained to patient, including pain, bleeding, infection, failure to remove abnormal tissue and failure to cure dysplasia, need for repeat procedures, damage to pelvic organs, cervical incompetence.  Role of HPV,cervical dysplasia and need for close followup was empasized. Informed written consent was obtained. All questions were answered. Time out performed.   ??Procedure: The patient was placed in lithotomy position and the bivalved coated speculum was placed in the patient's vagina. A grounding pad placed on the patient. Acetic acid applied to the cervix and the cervix viewed with a colposcope.  Lugol's solution was applied to the cervix and areas of decreased uptake were noted around the transformation zone, predominantly from 11 oclock to 4 oclock.   Local anesthesia was administered via an intracervical block using 10 ml of 2% Lidocaine with epinephrine. The suction was turned on and the Medium Fisher Cone Biopsy Excisor on 60 Watts of blended current was used to excise the area of decreased uptake and excise the entire transformation zone. Excellent hemostasis was achieved using roller ball coagulation set at 60 Watts coagulation current. Monsel's solution was then applied and the speculum was removed from the vagina. Specimens were sent to pathology.  ?The patient tolerated the procedure well. Post-operative instructions given to patient, including instruction to seek medical attention for persistent bright red bleeding, fever, abdominal/pelvic pain, dysuria, nausea or vomiting. She was also told about the possibility of having copious  yellow to black tinged discharge for weeks. She was counseled to avoid anything in the vagina (sex/douching/tampons) for 3 weeks. She has a 4 week post-operative check to assess wound healing, review results and discuss further management.

## 2022-12-20 ENCOUNTER — Encounter: Payer: Self-pay | Admitting: Obstetrics and Gynecology

## 2022-12-20 LAB — SURGICAL PATHOLOGY

## 2023-01-02 ENCOUNTER — Encounter: Payer: Self-pay | Admitting: Obstetrics and Gynecology

## 2023-01-22 NOTE — Progress Notes (Signed)
 GYNECOLOGY VISIT  Patient name: Rachel Cortez MRN [id number]  Date of birth: [birth year] Chief Complaint:   Gynecologic Exam and leep follow up  History:  Rachel Cortez is a 58 y.o. No obstetric history on file. being seen today for post LEEP follow up. Has been doing well. Denies fever, chills, pain and bleeding. Has been on pelvic rest (not sexually active). Brief abnormal discharge that has resolved.   A little upset - friend was diagnosed with cancer recently.   Past Medical History:  Diagnosis Date   Allergy    SEASONAL   Anemia    Heart murmur    HTN (hypertension)    Obesity    Osgood-Schlatter/osteochondroses     Past Surgical History:  Procedure Laterality Date   TUBAL LIGATION      The following portions of the patient's history were reviewed and updated as appropriate: allergies, current medications, past family history, past medical history, past social history, past surgical history and problem list.   Health Maintenance:   Last pap     Component Value Date/Time   DIAGPAP - Low grade squamous intraepithelial lesion (LSIL) (A) 08/23/2022 1133   DIAGPAP - Low grade squamous intraepithelial lesion (LSIL) (A) 08/11/2021 0854   HPVHIGH Positive (A) 08/23/2022 1133   HPVHIGH Negative 08/11/2021 0854   ADEQPAP  08/23/2022 1133    Satisfactory for evaluation; transformation zone component PRESENT.   ADEQPAP  08/11/2021 0854    Satisfactory for evaluation; transformation zone component PRESENT.   FINAL MICROSCOPIC DIAGNOSIS:   A. CERVIX, 12:00, BIOPSY:  Mild and moderate squamous dysplasia with HPV effect (HSIL, CIN-2)   B. ENDOCERVIX, CURETTAGE:  Minute detached fragment of atypical squamous epithelium, at least LSIL  Mucohemorrhagic debris with scant admixed benign endocervical epithelium  and endocervical cells    LEEP FINAL MICROSCOPIC DIAGNOSIS:   A. CERVIX, LEEP:  High-grade squamous intraepithelial lesion, CIN-2 (high-grade  dysplasia).   Endocervical margin involved in the 3-6 o'clock quadrant.  Ectocervical margin negative for dysplasia.   Last mammogram: 08/2022 BIRADS 1   Review of Systems:  Pertinent items are noted in HPI. Comprehensive review of systems was otherwise negative.   Objective:  Physical Exam BP (!) 163/77   Pulse 67   Wt 215 lb 12.8 oz (97.9 kg)   LMP 08/08/2016   BMI 31.87 kg/m    Physical Exam Vitals and nursing note reviewed. Exam conducted with a chaperone present.  Constitutional:      Appearance: Normal appearance.  HENT:     Head: Normocephalic and atraumatic.  Pulmonary:     Effort: Pulmonary effort is normal.     Breath sounds: Normal breath sounds.  Genitourinary:    General: Normal vulva.     Exam position: Lithotomy position.     Vagina: Normal.     Cervix: Erythema present.        Comments: Well healed cervix with mild erythema at 7 and 12 oclock, nontender to palpation, small volume discharge Skin:    General: Skin is warm and dry.  Neurological:     General: No focal deficit present.     Mental Status: She is alert.  Psychiatric:        Mood and Affect: Mood normal.        Behavior: Behavior normal.        Thought Content: Thought content normal.        Judgment: Judgment normal.          Assessment &  Plan:   1. High grade squamous intraepithelial lesion (HGSIL), grade 2 CIN, on biopsy of cervix (Primary) 2. S/P LEEP Reviewed pathology. Offered options of repeat excision higher up in cervix vs. Repeat colpo with ECC at 6 months. At this moment ok with repeat testing at 6 months but also wants to be sure it is all gone so will consider repeat excision instead - will message if she decides to proceed with repeat excision.    Routine preventative health maintenance measures emphasized.  Kiki Pelton, MD Minimally Invasive Gynecologic Surgery Center for [facility], [facility]

## 2023-01-23 ENCOUNTER — Ambulatory Visit: Payer: Medicaid Other | Admitting: Obstetrics and Gynecology

## 2023-01-23 ENCOUNTER — Encounter: Payer: Self-pay | Admitting: Obstetrics and Gynecology

## 2023-01-23 VITALS — BP 163/77 | HR 67 | Wt 215.8 lb

## 2023-01-23 DIAGNOSIS — Z9889 Other specified postprocedural states: Secondary | ICD-10-CM

## 2023-01-23 DIAGNOSIS — Z1331 Encounter for screening for depression: Secondary | ICD-10-CM

## 2023-01-23 DIAGNOSIS — N871 Moderate cervical dysplasia: Secondary | ICD-10-CM | POA: Diagnosis not present

## 2023-02-13 ENCOUNTER — Telehealth: Payer: Self-pay

## 2023-02-13 NOTE — Telephone Encounter (Signed)
 Patient called requesting to speak someone about our food market.   Franklin Baumbach,RN  02/13/23

## 2023-02-15 NOTE — Telephone Encounter (Signed)
 Called and spoke with patient regarding the food market. Pt wanted to know if she could go to the food market without an appointment. Informed patient that she was able to come without appointment and gave her the hours to the food market (Monday - thursday, 9:00 am - 4:00 pm and Friday 9:00 am to 12:00 pm). Patient verbalized understanding.   B'Aisha, CMA

## 2023-02-15 NOTE — Telephone Encounter (Signed)
 Attempted to call patient back regarding her VM she left our our office and to call our office back if she has any questions.  Moira Andrews, RN

## 2023-05-07 ENCOUNTER — Ambulatory Visit (INDEPENDENT_AMBULATORY_CARE_PROVIDER_SITE_OTHER): Payer: Self-pay

## 2023-05-07 DIAGNOSIS — Z111 Encounter for screening for respiratory tuberculosis: Secondary | ICD-10-CM

## 2023-05-07 NOTE — Progress Notes (Signed)
 Patient presents to nurse clinic for PPD placement.  PPD placed in left ventral forearm. Patient to return on 5/01 to have site read.

## 2023-05-09 ENCOUNTER — Encounter: Payer: Self-pay | Admitting: Family Medicine

## 2023-05-09 ENCOUNTER — Ambulatory Visit (INDEPENDENT_AMBULATORY_CARE_PROVIDER_SITE_OTHER): Payer: Self-pay

## 2023-05-09 DIAGNOSIS — Z111 Encounter for screening for respiratory tuberculosis: Secondary | ICD-10-CM

## 2023-05-09 LAB — TB SKIN TEST
Induration: 0 mm
TB Skin Test: NEGATIVE

## 2023-05-09 NOTE — Progress Notes (Signed)
PPD Reading Note PPD read and results entered in EpicCare. Result: 0 mm induration. Interpretation: Negative Allergic reaction: No  

## 2023-05-17 ENCOUNTER — Encounter: Payer: Self-pay | Admitting: Family Medicine

## 2023-05-17 ENCOUNTER — Ambulatory Visit (INDEPENDENT_AMBULATORY_CARE_PROVIDER_SITE_OTHER): Admitting: Family Medicine

## 2023-05-17 VITALS — BP 128/82 | HR 67 | Ht 69.0 in

## 2023-05-17 DIAGNOSIS — Z59819 Housing instability, housed unspecified: Secondary | ICD-10-CM

## 2023-05-17 DIAGNOSIS — Z1211 Encounter for screening for malignant neoplasm of colon: Secondary | ICD-10-CM

## 2023-05-17 DIAGNOSIS — N871 Moderate cervical dysplasia: Secondary | ICD-10-CM

## 2023-05-17 DIAGNOSIS — E7800 Pure hypercholesterolemia, unspecified: Secondary | ICD-10-CM

## 2023-05-17 NOTE — Progress Notes (Signed)
    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  Working with her mom and sister to try and come up with money for rent.  Starting a new job on 5/19  States her diet has not been great recently including junk food, would like to wait to check her cholesterol for another month or so with her PCP States her blood pressure may be high today because she is stressed  She had a LEEP procedure 6 months ago and is scheduled for follow-up for a repeat colposcopy on 5/19  Cologuard due, no symptoms, no family history  PERTINENT  PMH / PSH: Migraine, hyperlipidemia, elevated blood pressure, HGSIL grade 2 CIN  OBJECTIVE:   BP 128/82   Pulse 67   Ht 5\' 9"  (1.753 m)   LMP 08/08/2016   SpO2 98%   BMI 31.87 kg/m   General: tearful, NAD Cardiovascular: RRR, no m/r/g Respiratory: normal work of breathing on RA, CTAB  ASSESSMENT/PLAN:   Assessment & Plan HYPERCHOLESTEROLEMIA Last lipid panel 08/2021 LDL was 123.  Patient would like to follow-up in 1 month to get her lipid panel done, under stress today as noted above Screening for colon cancer Due for Cologuard screening, no symptoms and no family history of colon cancer.  Order placed today CIN II (cervical intraepithelial neoplasia II) Discussed her follow-up visit with OB/GYN on 5/19 for repeat colposcopy Housing instability Patient is concerned she is going to be evicted soon.  I messaged her after the visit on MyChart with resources and offered VBCI referral     Sarahann Cumins, DO [facility]

## 2023-05-17 NOTE — Assessment & Plan Note (Signed)
 Last lipid panel 08/2021 LDL was 123.  Patient would like to follow-up in 1 month to get her lipid panel done, under stress today as noted above

## 2023-05-17 NOTE — Patient Instructions (Addendum)
 Good to see you today - Thank you for coming in  Things we discussed today:  I have ordered your Cologuard.  They should call you with instructions on how to complete this. I filled out your form for work as well Please make an appointment on the way out for healthcare maintenance such as your lipid panel Keep an eye on your blood pressure at home. Your goal blood pressure is less than 135/85  Check your blood pressure several times a week.  If regularly higher than this please let me know - either with MyChart or leaving a phone message. Next visit please bring in your blood pressure cuff.    Please make a follow-up appointment with your PCP in 3 months I am sorry for the stress that you are under right now, I hope things get better soon for you  Family Medicine [employer] Sheet Transportation Resources:  [facility] Transportation-[phone] used for [facility] related appointments only.  (emergencies and if your insurance does not office transportation)  Medicaid Transportation Medical appointment: Call [phone] to apply SCAT Paratransit services: Eligible riders only, call [phone] for an application Non- Medicaid Transportation ([facility] Card/ [facility]) Call for more information [phone] Medicare Transportation- contact your provider to see if they offer transportation to medical appointments. The phone number is listed on the back of your card. Sports coach Age 69 & older [phone] (HP) [phone] (GSB)  [facility] [place] for non-medical rides,  Age 36 or older  [phone] [place] only --Dial a Ride for medical and non-medical,  Age 86 or older   [phone] [facility] Senior Adults Association:   [phone]  Insurance options Medicaid  - local Department of social Services on online applications  DEPARTMENT OF SOCIAL SERVICES: [address] [place], [place] [address]    [phone] [place]  RuleEnforcement.cz   Call [phone] [facility] Navigator Consortium   www.MediaSquawk.com.cy   305-525-2959  Orange Card- Pick up application at the front desk  Who Does the [facility] Serve? The [facility] serves any patient with an income between 0%-200% of the federal poverty level.  GCCN patients must be exempt from the Affordable Care Act in order to enroll into the [facility].  Housing Resources: [employer]:   www.gha-[place].org/  808-447-5435 [address] [place], [place], [place] [address]  [employer] Education administrator.NCHousingSearch.Seabron [place]   ([phone])[phone] [facility] Housing Authority:  787-263-0582     [address] [place], [place], [place] [address]    [facility] Housing Authority:  [phone]     [address] Leocadia Rains [place], [place] [address]   Affordable Housing Management:  636-472-1767  http://www.SecuritiesCard.pl.cfm  [address] [place], [place] [address] Partnership Ending Homelessness: Coordinated Entry ([phone]  Homeless: Chief Strategy [facility]) [phone]   [address]  [employer]: [facility] Emergency Shelter By appointment only - Call [phone]  [facility] ([facility]UM)   [address] [phone] Pathways  [address] [phone] [facility] Family Shelter  [place]  2540679710 Housing Repair [employer] Housing Solutions (Home repair program)  www.http://ewing.com/  [address]  Carry Clapper [place], [place] [address]   [phone]  (For homeowners only) [employer]  318-081-6488 and older)  [phone] ext [phone]  Possible Utility Resources  Welfare Music therapist.WRLP.net  2107816913 [facility], [phone] [place] ([phone] or 531-212-1850 (some housing help available, priority goes to GUM [employer] shelter residents with an income) [facility] Department of Social Services  (assistance available as funding permits) [address] [place] or [address] [place] [facility] [phone]  QI HKVQQVZ DG LOVF Society [address] [phone]   (only, as funding permits) [employer] [phone] 8:30-5:00 [facility] [phone] Wardell Guys, Th 10:00am-2:00pm [facility] [phone], extension [phone]  T, Th 10:00am-1:00pm [facility]), [phone] [facility]), [address] [phone]  Interpersonal Safety Family Justice Center:  ([phone] [address], [employer] (2nd floor) Walk-in hours M-F 8:30 to 4:30    www.mygu[place]ford.com/family-justice-center Victims of domestic violence, child abuse, sexual assault, & elder abuse Family Service of the [place]    24-hr Crisis line:  403-621-1293 (GSB)  &  9781071244 (HP) [address] Leonard [place]     Domestic violence, rape and victim assistance      [employer] [facility] [place]- Trauma Recovery Program  www.kellinfoundation.org  [address] Geraldene Kleine  [place], [place] [address]     [phone]     Family violence, domestic violence, traumatic or stressful life events Women's Resource Center:  [address], [place], [place]  Call [phone]  (food and clothing referrals, legal advice, etc.).   Food Resources SNAP/ Food benefits: [facility] DSS [phone] [address] [place] ;     [address] [place], [place] [facility]) Nutrition program [place] [phone];     [place] [phone]    Legal Aid Helpline Toll-free: 8-338-250-NLZJ 629-509-4450) Senior Legal Helpline : 631-626-9663 Hours: Monday - Friday: 9-11 a.m. & 1-3 p.m. Free legal help for [place]ians 4 years of age or older. No income limitations apply. [place] Navigator Helpline : 3086129121 Hours: Monday - Friday: 10 a.m. - 4 p.m.  Free help with health insurance on the [employer]. Fair Housing Helpline : 930-310-9386 808-609-0568) : Free help with housing discrimination. Battered Immigrant Helpline : (559)047-3777 Hours: Thursdays: 9 a.m. - 4 p.m. Free help for immigrants who are victims of domestic violence.  [place]  [address]: Confidential 24/7/365  with Multilingual for all community resources. Three ways to obtain information: Dial ([phone]  Search online: https://burton-kramer.[email or web address]/    Senior Line: Community information & Referral - [place] [phone]    [facility] [phone]  Medication Assistance Program (MAP)-   Who is eligible for MAP services? Adult [facility] residents who have incomes at or below 200% of the [employer] Poverty Scale ($21,660 if single, $29,140 if married). MAP offices are located at the [facility] of Public Health:  [address] [phone]    [address] [place] [phone]

## 2023-05-27 ENCOUNTER — Ambulatory Visit: Admitting: Obstetrics and Gynecology

## 2023-05-27 ENCOUNTER — Other Ambulatory Visit: Payer: Self-pay

## 2023-05-27 ENCOUNTER — Other Ambulatory Visit (HOSPITAL_COMMUNITY)
Admission: RE | Admit: 2023-05-27 | Discharge: 2023-05-27 | Disposition: A | Discharge status: Home / Self Care | Source: Ambulatory Visit | Attending: Obstetrics and Gynecology | Admitting: Obstetrics and Gynecology

## 2023-05-27 VITALS — BP 154/75 | HR 65

## 2023-05-27 DIAGNOSIS — Z1331 Encounter for screening for depression: Secondary | ICD-10-CM

## 2023-05-27 DIAGNOSIS — Z9889 Other specified postprocedural states: Secondary | ICD-10-CM | POA: Insufficient documentation

## 2023-05-27 DIAGNOSIS — N871 Moderate cervical dysplasia: Secondary | ICD-10-CM | POA: Insufficient documentation

## 2023-05-27 NOTE — Progress Notes (Signed)
    GYNECOLOGY VISIT  Patient name: Rachel Cortez MRN [id number]  Date of birth: [birth year] Chief Complaint:   Gynecologic Exam  History:  CALANDRA MADURA is a 58 y.o. No obstetric history on file. being seen today for post LEEP follow up.  Has not had any gynecologic concerns.   Past Medical History:  Diagnosis Date   Allergy    SEASONAL   Anemia    Heart murmur    HTN (hypertension)    Obesity    Osgood-Schlatter/osteochondroses     Past Surgical History:  Procedure Laterality Date   TUBAL LIGATION      The following portions of the patient's history were reviewed and updated as appropriate: allergies, current medications, past family history, past medical history, past social history, past surgical history and problem list.   Health Maintenance:   Last pap  Pap 03/29/2017 NILM Pap 08/12/2022 LSIL, HR HPV negative PAP 08/23/22: LSIL, HR HPV positive Colpo 09/06/22:  CIN 2 w/ at least LSIL on ECC LEEP 12/18/2022: CIN 2 w/ positive endocervical margin, negative ectocervical   Last mammogram: 08/2022 BIRADS 1   Review of Systems:  Pertinent items are noted in HPI. Comprehensive review of systems was otherwise negative.   Objective:  Physical Exam BP (!) 154/75   Pulse 65   LMP 08/08/2016    Physical Exam Vitals and nursing note reviewed. Exam conducted with a chaperone present.  Constitutional:      Appearance: Normal appearance.  HENT:     Head: Normocephalic and atraumatic.  Pulmonary:     Effort: Pulmonary effort is normal.     Breath sounds: Normal breath sounds.  Genitourinary:    General: Normal vulva.     Exam position: Lithotomy position.     Vagina: Normal.     Cervix: Normal.     Comments: Well healed cervix Mild friability with pap acquisition  Skin:    General: Skin is warm and dry.  Neurological:     General: No focal deficit present.     Mental Status: She is alert.  Psychiatric:        Mood and Affect: Mood normal.        Behavior: Behavior  normal.        Thought Content: Thought content normal.        Judgment: Judgment normal.       Assessment & Plan:   1. High grade squamous intraepithelial lesion (HGSIL), grade 2 CIN, on biopsy of cervix (Primary) 2. S/P LEEP Post LEEP pap collected with ECC given positive endocervical margin on LEEP. Will follow up results  - Cytology - PAP - Surgical pathology   Routine preventative health maintenance measures emphasized.  Kiki Pelton, MD Minimally Invasive Gynecologic Surgery Center for [facility], [facility]

## 2023-05-28 ENCOUNTER — Ambulatory Visit: Payer: Self-pay | Admitting: Obstetrics and Gynecology

## 2023-05-28 LAB — SURGICAL PATHOLOGY

## 2023-05-29 LAB — CYTOLOGY - PAP
Comment: NEGATIVE
Comment: NEGATIVE
Comment: NEGATIVE
Diagnosis: UNDETERMINED — AB
HPV 16: NEGATIVE
HPV 18 / 45: NEGATIVE
High risk HPV: POSITIVE — AB

## 2023-05-30 ENCOUNTER — Telehealth: Payer: Self-pay

## 2023-05-30 NOTE — Telephone Encounter (Signed)
 Called PT to go over test results & to advise that a Repeat Colpo is needed due to +HPV, Pt states did see results but was confused, advised ECC came back benign but due to +HPV on Pap need to do Colpo over & that front desk will be calling to make appointment. Pt verbalized understanding.

## 2023-05-31 ENCOUNTER — Ambulatory Visit: Payer: Self-pay | Admitting: Family Medicine

## 2023-05-31 LAB — COLOGUARD: COLOGUARD: NEGATIVE

## 2023-06-05 ENCOUNTER — Telehealth: Payer: Self-pay | Admitting: Family Medicine

## 2023-06-05 NOTE — Telephone Encounter (Signed)
 Attempted to call patient and schedule a procedure. Left detailed message with office number.

## 2023-06-20 ENCOUNTER — Telehealth: Payer: Self-pay

## 2023-06-20 NOTE — Telephone Encounter (Signed)
 Patient LVM on nurse line requesting a call back from PCP.   I called patient, however no answer.   VM left asking her to call me back to discuss.

## 2023-07-10 ENCOUNTER — Telehealth: Payer: Self-pay | Admitting: *Deleted

## 2023-07-10 NOTE — Telephone Encounter (Signed)
 Rachel Cortez called and left a voicemessage this afternoon stating she has an upcoming  procedure visit and not sure about what it is for. She requests a call back. I called and left a message I am returning her call and to call back. Rock Skip PEAK

## 2023-07-11 NOTE — Telephone Encounter (Signed)
 Called pt and discussed her concerns regarding Colposcopy appointment on 7/10. Procedure and rationale was explained. Her questions were answered to her satisfaction and understanding. She will keep appt as scheduled on 7/10

## 2023-07-18 ENCOUNTER — Other Ambulatory Visit: Payer: Self-pay

## 2023-07-18 ENCOUNTER — Ambulatory Visit: Admitting: Obstetrics and Gynecology

## 2023-07-18 ENCOUNTER — Other Ambulatory Visit (HOSPITAL_COMMUNITY)
Admission: RE | Admit: 2023-07-18 | Discharge: 2023-07-18 | Disposition: A | Discharge status: Home / Self Care | Source: Ambulatory Visit | Attending: Obstetrics and Gynecology | Admitting: Obstetrics and Gynecology

## 2023-07-18 ENCOUNTER — Encounter: Payer: Self-pay | Admitting: Family Medicine

## 2023-07-18 VITALS — BP 179/91 | HR 56

## 2023-07-18 DIAGNOSIS — Z9889 Other specified postprocedural states: Secondary | ICD-10-CM | POA: Diagnosis present

## 2023-07-18 DIAGNOSIS — Z1331 Encounter for screening for depression: Secondary | ICD-10-CM | POA: Diagnosis not present

## 2023-07-18 NOTE — Progress Notes (Signed)
    GYNECOLOGY OFFICE COLPOSCOPY PROCEDURE NOTE  58 y.o. No obstetric history on file. here for colposcopy for ASCUS with POSITIVE high risk HPV pap smear on 05/27/23 on post LEEP pap. Discussed role for HPV in cervical dysplasia, need for surveillance.  Patient gave informed written consent, time out was performed.  Placed in lithotomy position. Cervix viewed with speculum and colposcope after application of acetic acid.   Colposcopy adequate? Yes  increased vascularity noted at 5 o'clock without any acetowhite changes; corresponding biopsies obtained.  ECC specimen obtained. All specimens were labeled and sent to pathology.  Chaperone was present during entire procedure.  Patient was given post procedure instructions.  Will follow up pathology and manage accordingly; patient will be contacted with results and recommendations.  Routine preventative health maintenance measures emphasized.   Carter Quarry, MD, FACOG Minimally Invasive Gynecologic Surgery  Obstetrics and Gynecology, [facility] for [facility], [facility] 07/18/2023

## 2023-07-19 ENCOUNTER — Ambulatory Visit: Payer: Self-pay | Admitting: Obstetrics and Gynecology

## 2023-07-19 LAB — SURGICAL PATHOLOGY

## 2023-10-22 ENCOUNTER — Telehealth: Admitting: Nurse Practitioner

## 2023-10-22 DIAGNOSIS — M545 Low back pain, unspecified: Secondary | ICD-10-CM

## 2023-10-22 MED ORDER — NAPROXEN 500 MG PO TABS: 500.0000 mg | ORAL_TABLET | Freq: Two times a day (BID) | ORAL | 0 refills | Status: AC

## 2023-10-22 MED ORDER — CYCLOBENZAPRINE HCL 10 MG PO TABS: 10.0000 mg | ORAL_TABLET | Freq: Three times a day (TID) | ORAL | 0 refills | Status: AC | PRN

## 2023-10-22 MED ORDER — BACLOFEN 10 MG PO TABS: 10.0000 mg | ORAL_TABLET | Freq: Three times a day (TID) | ORAL | 0 refills | Status: DC

## 2023-10-22 NOTE — Progress Notes (Signed)
 We are sorry that you are not feeling well.  Here is how we plan to help!  Based on what you have shared with me it looks like you mostly have acute back pain.  Acute back pain is defined as musculoskeletal pain that can resolve in 1-3 weeks with conservative treatment.  I have prescribed Naprosyn  500 mg take one by mouth twice a day non-steroid anti-inflammatory (NSAID) as well as Baclofen  10 mg every eight hours as needed which is a muscle relaxer  Some patients experience stomach irritation or in increased heartburn with anti-inflammatory drugs.  Please keep in mind that muscle relaxer's can cause fatigue and should not be taken while at work or driving.  Back pain is very common.  The pain often gets better over time.  The cause of back pain is usually not dangerous.  Most people can learn to manage their back pain on their own.  I have sent a work note to Pharmacologist. You can find by going to the Menu on your homepage, scrolling down to the Communications section, and selecting Letters. Let us  know if you have any issue locating. Take care and feel better soon!   Home Care Stay active.  Start with short walks on flat ground if you can.  Try to walk farther each day. Do not sit, drive or stand in one place for more than 30 minutes.  Do not stay in bed. Do not avoid exercise or work.  Activity can help your back heal faster. Be careful when you bend or lift an object.  Bend at your knees, keep the object close to you, and do not twist. Sleep on a firm mattress.  Lie on your side, and bend your knees.  If you lie on your back, put a pillow under your knees. Only take medicines as told by your doctor. Put ice on the injured area. Put ice in a plastic bag Place a towel between your skin and the bag Leave the ice on for 15-20 minutes, 3-4 times a day for the first 2-3 days. 210 After that, you can switch between ice and heat packs. Ask your doctor about back exercises or massage. Avoid feeling  anxious or stressed.  Find good ways to deal with stress, such as exercise.  Get Help Right Way If: Your pain does not go away with rest or medicine. Your pain does not go away in 1 week. You have new problems. You do not feel well. The pain spreads into your legs. You cannot control when you poop (bowel movement) or pee (urinate) You feel sick to your stomach (nauseous) or throw up (vomit) You have belly (abdominal) pain. You feel like you may pass out (faint). If you develop a fever.  Make Sure you: Understand these instructions. Will watch your condition Will get help right away if you are not doing well or get worse.  Your e-visit answers were reviewed by a board certified advanced clinical practitioner to complete your personal care plan.  Depending on the condition, your plan could have included both over the counter or prescription medications.  If there is a problem please reply  once you have received a response from your provider.  Your safety is important to us .  If you have drug allergies check your prescription carefully.    You can use MyChart to ask questions about today's visit, request a non-urgent call back, or ask for a work or school excuse for 24 hours related to this  e-Visit. If it has been greater than 24 hours you will need to follow up with your provider, or enter a new e-Visit to address those concerns.  You will get an e-mail in the next two days asking about your experience.  I hope that your e-visit has been valuable and will speed your recovery. Thank you for using e-visits.   I have spent 5 minutes in review of e-visit questionnaire, review and updating patient chart, medical decision making and response to patient.   Elsie Velma Lunger, PA-C

## 2023-10-22 NOTE — Addendum Note (Signed)
 Addended by: MOISHE CHIQUITA HERO on: 10/22/2023 08:06 AM   Modules accepted: Orders

## 2023-10-27 ENCOUNTER — Encounter (HOSPITAL_COMMUNITY): Payer: Self-pay | Admitting: Emergency Medicine

## 2023-10-27 ENCOUNTER — Ambulatory Visit (HOSPITAL_COMMUNITY)
Admission: EM | Admit: 2023-10-27 | Discharge: 2023-10-27 | Disposition: A | Discharge status: Home / Self Care | Payer: Self-pay | Attending: Internal Medicine | Admitting: Internal Medicine

## 2023-10-27 ENCOUNTER — Other Ambulatory Visit: Payer: Self-pay

## 2023-10-27 DIAGNOSIS — M5441 Lumbago with sciatica, right side: Secondary | ICD-10-CM | POA: Insufficient documentation

## 2023-10-27 DIAGNOSIS — M5442 Lumbago with sciatica, left side: Secondary | ICD-10-CM | POA: Insufficient documentation

## 2023-10-27 DIAGNOSIS — R1084 Generalized abdominal pain: Secondary | ICD-10-CM | POA: Insufficient documentation

## 2023-10-27 LAB — POCT URINALYSIS DIP (MANUAL ENTRY)
Bilirubin, UA: NEGATIVE
Blood, UA: NEGATIVE
Glucose, UA: NEGATIVE mg/dL
Ketones, POC UA: NEGATIVE mg/dL
Leukocytes, UA: NEGATIVE
Nitrite, UA: NEGATIVE
Protein Ur, POC: NEGATIVE mg/dL
Spec Grav, UA: 1.01 (ref 1.010–1.025)
Urobilinogen, UA: 0.2 U/dL
pH, UA: 5.5 (ref 5.0–8.0)

## 2023-10-27 MED ORDER — PREDNISONE 20 MG PO TABS
40.0000 mg | ORAL_TABLET | Freq: Every day | ORAL | 0 refills | Status: AC
Start: 2023-10-27 — End: 2023-11-01

## 2023-10-27 NOTE — ED Triage Notes (Signed)
 History of arthritis in back.  Pain in back for 2 weeks.  Has taken muscle relaxer's for this.  Abdominal cramping started 2 weeks ago. Patient does reports having intercourse and and condom broke.  Patient has been researching symptoms and now is paranoid per patient.    Has taken naproxen  yesterday twice and muscle relaxer Friday.  Medicines help at the time.  No medicines today  Denies urinary symptoms Denies vaginal discharge

## 2023-10-27 NOTE — ED Provider Notes (Signed)
 [facility]    CSN: [id number] Arrival date & time: 10/27/23  1004      History   Chief Complaint Chief Complaint  Patient presents with   Back Pain    HPI Rachel Cortez is a 58 y.o. female.   59 year old female presents urgent care with complaints of lower back pain and lower abdominal pain.  She reports both symptoms have been going on for about 2 weeks.  She has been using naproxen  and muscle relaxers including baclofen  and Flexeril .  She has a history of lower back pain and has been told she has arthritis in the back.  The lower abdominal pain is only mild.  It is not associated with nausea, vomiting, fevers, dysuria, hematuria, vaginal pain, vaginal discharge.  She does note that about 2 weeks ago she had a condom break during intercourse.  This is a partner she has had for several years but she still would like to have STI testing done.   Back Pain Associated symptoms: abdominal pain   Associated symptoms: no chest pain, no dysuria and no fever     Past Medical History:  Diagnosis Date   Allergy    SEASONAL   Anemia    Heart murmur    HTN (hypertension)    Obesity    Osgood-Schlatter/osteochondroses     Patient Active Problem List   Diagnosis Date Noted   Abnormal Pap smear of cervix 08/23/2022   Traumatic fracture of toenail 08/23/2022   Healthcare maintenance 11/06/2021   Chronic bilateral low back pain without sciatica 10/07/2018   Chronic pain of both knees 10/07/2018   Elevated BP without diagnosis of hypertension 08/26/2018   HYPERCHOLESTEROLEMIA 03/07/2006   Obesity (BMI 30-39.9) 03/07/2006   Migraine 03/07/2006    Past Surgical History:  Procedure Laterality Date   TUBAL LIGATION      OB History   No obstetric history on file.      Home Medications    Prior to Admission medications   Medication Sig Start Date End Date Taking? Authorizing Provider  predniSONE  (DELTASONE ) 20 MG tablet Take 2 tablets (40 mg total) by mouth daily  with breakfast for 5 days. 10/27/23 11/01/23 Yes Zakeya Junker A, PA-C  cyclobenzaprine  (FLEXERIL ) 10 MG tablet Take 1 tablet (10 mg total) by mouth 3 (three) times daily as needed for muscle spasms. 10/22/23   Moishe Chiquita HERO, NP  fexofenadine (ALLEGRA) 180 MG tablet Take 180 mg by mouth daily. Patient not taking: Reported on 07/18/2023    [provider]  naproxen  (NAPROSYN ) 500 MG tablet Take 1 tablet (500 mg total) by mouth 2 (two) times daily with a meal. 10/22/23   Gladis Elsie JAYSON, PA-C    Family History Family History  Problem Relation Age of Onset   Diabetes Mother    Hypertension Mother    Hyperlipidemia Mother    Hypertension Brother    Hyperlipidemia Brother    Diabetes Maternal Grandmother     Social History Social History   Tobacco Use   Smoking status: Never   Smokeless tobacco: Never  Vaping Use   Vaping status: Never Used  Substance Use Topics   Alcohol use: No   Drug use: No     Allergies   Codeine, Crab [shellfish allergy], and Penicillins   Review of Systems Review of Systems  Constitutional:  Negative for chills and fever.  HENT:  Negative for ear pain and sore throat.   Eyes:  Negative for pain and visual  disturbance.  Respiratory:  Negative for cough and shortness of breath.   Cardiovascular:  Negative for chest pain and palpitations.  Gastrointestinal:  Positive for abdominal pain. Negative for vomiting.  Genitourinary:  Negative for dysuria, frequency, hematuria, urgency, vaginal bleeding, vaginal discharge and vaginal pain.  Musculoskeletal:  Positive for back pain. Negative for arthralgias.  Skin:  Negative for color change and rash.  Neurological:  Negative for seizures and syncope.  All other systems reviewed and are negative.    Physical Exam Triage Vital Signs ED Triage Vitals  Encounter Vitals Group     BP 10/27/23 1104 (!) 159/90     Girls Systolic BP Percentile --      Girls Diastolic BP Percentile --      Boys  Systolic BP Percentile --      Boys Diastolic BP Percentile --      Pulse Rate 10/27/23 1104 79     Resp 10/27/23 1104 20     Temp 10/27/23 1104 98.7 F (37.1 C)     Temp Source 10/27/23 1104 Oral     SpO2 10/27/23 1104 97 %     Weight --      Height --      Head Circumference --      Peak Flow --      Pain Score 10/27/23 1101 6     Pain Loc --      Pain Education --      Exclude from Growth Chart --    No data found.  Updated Vital Signs BP (!) 159/90 (BP Location: Right Arm) Comment (BP Location): large cuff  Pulse 79   Temp 98.7 F (37.1 C) (Oral)   Resp 20   LMP 08/08/2016   SpO2 97%   Visual Acuity Right Eye Distance:   Left Eye Distance:   Bilateral Distance:    Right Eye Near:   Left Eye Near:    Bilateral Near:     Physical Exam Vitals and nursing note reviewed.  Constitutional:      General: She is not in acute distress.    Appearance: She is well-developed.  HENT:     Head: Normocephalic and atraumatic.  Eyes:     Conjunctiva/sclera: Conjunctivae normal.  Cardiovascular:     Rate and Rhythm: Normal rate and regular rhythm.     Heart sounds: No murmur heard. Pulmonary:     Effort: Pulmonary effort is normal. No respiratory distress.     Breath sounds: Normal breath sounds.  Abdominal:     General: Bowel sounds are normal. There is no distension.     Palpations: Abdomen is soft.     Tenderness: There is no abdominal tenderness. There is no right CVA tenderness, left CVA tenderness, guarding or rebound.     Hernia: There is no hernia in the umbilical area.  Musculoskeletal:        General: No swelling.     Cervical back: Neck supple.     Lumbar back: Spasms and tenderness present. No deformity. Normal range of motion. Positive right straight leg raise test and positive left straight leg raise test.  Skin:    General: Skin is warm and dry.     Capillary Refill: Capillary refill takes less than 2 seconds.  Neurological:     Mental Status: She is  alert.  Psychiatric:        Mood and Affect: Mood normal.      UC Treatments / Results  Labs (all labs  ordered are listed, but only abnormal results are displayed) Labs Reviewed  POCT URINALYSIS DIP (MANUAL ENTRY)  CERVICOVAGINAL ANCILLARY ONLY    EKG   Radiology No results found.  Procedures Procedures (including critical care time)  Medications Ordered in UC Medications - No data to display  Initial Impression / Assessment and Plan / UC Course  I have reviewed the triage vital signs and the nursing notes.  Pertinent labs & imaging results that were available during my care of the patient were reviewed by me and considered in my medical decision making (see chart for details).     Generalized abdominal pain - Plan: POC urinalysis dipstick, POC urinalysis dipstick  Acute bilateral low back pain with bilateral sciatica   Lower back pain with lower abdominal cramping. Abdominal exam and vital signs are reassuring and urine is negative for infection. We have testing pending for STIs. Back pain may be causing pain into the abdomen. We will treat the back pain with a stronger anti-inflammatory. If the abdominal pain does not improve, may need to follow up with PCP.  Prednisone  40 mg daily for 5 days. Take this in the morning.  Do not take NSAIDs with this medication including aspirin, ibuprofen /Advil , naproxen /Aleve  until finished with steroids. Ok to take tylenol . May continue muscle relaxers previously prescribe. Light stretching to improve mobility.  Use heat and gentle stretch for symptom relief.  Urinalysis done today is negative for urinary tract infection. Screening swab done today and results will be available in 24-48 hours. We will contact you if we need to arrange additional treatment based on your testing. Negative results will be on your MyChart account.  If anything worsens or you have increasing pain, weakness in your legs, bowel/bladder incontinence,  paresthesias you need to be seen immediately.    Final Clinical Impressions(s) / UC Diagnoses   Final diagnoses:  Generalized abdominal pain  Acute bilateral low back pain with bilateral sciatica     Discharge Instructions      Lower back pain with lower abdominal cramping. Abdominal exam and vital signs are reassuring and urine is negative for infection. We have testing pending for STIs. Back pain may be causing pain into the abdomen. We will treat the back pain with a stronger anti-inflammatory. If the abdominal pain does not improve, may need to follow up with PCP.  Prednisone  40 mg daily for 5 days. Take this in the morning.  Do not take NSAIDs with this medication including aspirin, ibuprofen /Advil , naproxen /Aleve  until finished with steroids. Ok to take tylenol . May continue muscle relaxers previously prescribe. Light stretching to improve mobility.  Use heat and gentle stretch for symptom relief.  Urinalysis done today is negative for urinary tract infection. Screening swab done today and results will be available in 24-48 hours. We will contact you if we need to arrange additional treatment based on your testing. Negative results will be on your MyChart account.  If anything worsens or you have increasing pain, weakness in your legs, bowel/bladder incontinence, paresthesias you need to be seen immediately.      ED Prescriptions     Medication Sig Dispense Auth. Provider   predniSONE  (DELTASONE ) 20 MG tablet Take 2 tablets (40 mg total) by mouth daily with breakfast for 5 days. 10 tablet Teresa Almarie LABOR, [place]      PDMP not reviewed this encounter.   Teresa Almarie LABOR, PA-C 10/27/23 1155

## 2023-10-27 NOTE — Discharge Instructions (Addendum)
 Lower back pain with lower abdominal cramping. Abdominal exam and vital signs are reassuring and urine is negative for infection. We have testing pending for STIs. Back pain may be causing pain into the abdomen. We will treat the back pain with a stronger anti-inflammatory. If the abdominal pain does not improve, may need to follow up with PCP.  Prednisone  40 mg daily for 5 days. Take this in the morning.  Do not take NSAIDs with this medication including aspirin, ibuprofen /Advil , naproxen /Aleve  until finished with steroids. Ok to take tylenol . May continue muscle relaxers previously prescribe. Light stretching to improve mobility.  Use heat and gentle stretch for symptom relief.  Urinalysis done today is negative for urinary tract infection. Screening swab done today and results will be available in 24-48 hours. We will contact you if we need to arrange additional treatment based on your testing. Negative results will be on your MyChart account.  If anything worsens or you have increasing pain, weakness in your legs, bowel/bladder incontinence, paresthesias you need to be seen immediately.

## 2023-10-28 LAB — CERVICOVAGINAL ANCILLARY ONLY
Chlamydia: NEGATIVE
Comment: NEGATIVE
Comment: NEGATIVE
Comment: NORMAL
Neisseria Gonorrhea: NEGATIVE
Trichomonas: NEGATIVE

## 2023-11-08 ENCOUNTER — Encounter: Payer: Self-pay | Admitting: Family Medicine

## 2024-01-20 ENCOUNTER — Other Ambulatory Visit: Payer: Self-pay | Admitting: Family Medicine

## 2024-01-20 DIAGNOSIS — Z1231 Encounter for screening mammogram for malignant neoplasm of breast: Secondary | ICD-10-CM
